# Patient Record
Sex: Male | Born: 1950 | Race: White | Hispanic: No | Marital: Married | Smoking: Never smoker
Health system: Southern US, Community
[De-identification: ages and names within clinical notes are randomized; demographics above are authoritative.]

## PROBLEM LIST (undated history)

## (undated) DIAGNOSIS — K219 Gastro-esophageal reflux disease without esophagitis: Secondary | ICD-10-CM

## (undated) DIAGNOSIS — M1990 Unspecified osteoarthritis, unspecified site: Secondary | ICD-10-CM

## (undated) DIAGNOSIS — T7840XA Allergy, unspecified, initial encounter: Secondary | ICD-10-CM

## (undated) DIAGNOSIS — N200 Calculus of kidney: Secondary | ICD-10-CM

## (undated) HISTORY — PX: KNEE SURGERY: SHX244

## (undated) HISTORY — PX: KNEE ARTHROSCOPY: SHX127

## (undated) HISTORY — PX: TONSILLECTOMY: SUR1361

## (undated) HISTORY — PX: JOINT REPLACEMENT: SHX530

## (undated) HISTORY — DX: Allergy, unspecified, initial encounter: T78.40XA

---

## 1983-12-07 HISTORY — PX: VASECTOMY: SHX75

## 2010-03-16 ENCOUNTER — Ambulatory Visit (HOSPITAL_COMMUNITY): Admission: RE | Admit: 2010-03-16 | Discharge: 2010-03-16 | Payer: Self-pay | Admitting: Internal Medicine

## 2012-05-16 ENCOUNTER — Ambulatory Visit: Payer: BC Managed Care – PPO

## 2012-05-16 ENCOUNTER — Ambulatory Visit (INDEPENDENT_AMBULATORY_CARE_PROVIDER_SITE_OTHER): Payer: BC Managed Care – PPO | Admitting: Internal Medicine

## 2012-05-16 ENCOUNTER — Telehealth: Payer: Self-pay

## 2012-05-16 VITALS — BP 121/74 | HR 65 | Temp 98.2°F | Resp 16 | Ht 67.0 in | Wt 251.0 lb

## 2012-05-16 DIAGNOSIS — R269 Unspecified abnormalities of gait and mobility: Secondary | ICD-10-CM

## 2012-05-16 DIAGNOSIS — Z0000 Encounter for general adult medical examination without abnormal findings: Secondary | ICD-10-CM

## 2012-05-16 DIAGNOSIS — Z23 Encounter for immunization: Secondary | ICD-10-CM

## 2012-05-16 DIAGNOSIS — M2550 Pain in unspecified joint: Secondary | ICD-10-CM

## 2012-05-16 DIAGNOSIS — E663 Overweight: Secondary | ICD-10-CM | POA: Insufficient documentation

## 2012-05-16 DIAGNOSIS — M25552 Pain in left hip: Secondary | ICD-10-CM

## 2012-05-16 LAB — LIPID PANEL
HDL: 37 mg/dL — ABNORMAL LOW (ref >39)
LDL Cholesterol: 91 mg/dL (ref 0–99)
Triglycerides: 85 mg/dL (ref <150)
VLDL: 17 mg/dL (ref 0–40)

## 2012-05-16 LAB — POCT URINALYSIS DIPSTICK
Bilirubin, UA: NEGATIVE
Glucose, UA: NEGATIVE
Leukocytes, UA: NEGATIVE
pH, UA: 5.5

## 2012-05-16 LAB — COMPREHENSIVE METABOLIC PANEL
ALT: 20 U/L (ref 0–53)
Albumin: 4.5 g/dL (ref 3.5–5.2)
Alkaline Phosphatase: 59 U/L (ref 39–117)
BUN: 16 mg/dL (ref 6–23)
Chloride: 106 mEq/L (ref 96–112)
Potassium: 4.4 mEq/L (ref 3.5–5.3)

## 2012-05-16 LAB — POCT UA - MICROSCOPIC ONLY
Casts, Ur, LPF, POC: NEGATIVE
Epithelial cells, urine per micros: NEGATIVE
Mucus, UA: NEGATIVE
RBC, urine, microscopic: NEGATIVE
Yeast, UA: NEGATIVE

## 2012-05-16 LAB — POCT CBC
Lymph, poc: 1.5 (ref 0.6–3.4)
MCH, POC: 30.4 pg (ref 27–31.2)
MCHC: 32.9 g/dL (ref 31.8–35.4)
MPV: 9.1 fL (ref 0–99.8)
POC LYMPH PERCENT: 37.4 %L (ref 10–50)
POC MID %: 8.1 %M (ref 0–12)
Platelet Count, POC: 292 10*3/uL (ref 142–424)
RBC: 4.54 M/uL — AB (ref 4.69–6.13)
RDW, POC: 13 %
WBC: 3.9 10*3/uL — AB (ref 4.6–10.2)

## 2012-05-16 LAB — POCT SEDIMENTATION RATE: POCT SED RATE: 15 mm/hr (ref 0–22)

## 2012-05-16 LAB — PSA: PSA: 0.56 ng/mL (ref <=4.00)

## 2012-05-16 MED ORDER — TRAMADOL HCL 50 MG PO TABS: 50.0000 mg | ORAL_TABLET | Freq: Three times a day (TID) | ORAL | Status: AC | PRN

## 2012-05-16 NOTE — Patient Instructions (Signed)
Degenerative Arthritis  You have osteoarthritis. This is the wear and tear arthritis that comes with aging. It is also called degenerative arthritis. This is common in people past middle age. It is caused by stress on the joints. The large weight bearing joints of the lower extremities are most often affected. The knees, hips, back, neck, and hands can become painful, swollen, and stiff. This is the most common type of arthritis. It comes on with age, carrying too much weight, or from an injury.  Treatment includes resting the sore joint until the pain and swelling improve. Crutches or a walker may be needed for severe flares. Only take over-the-counter or prescription medicines for pain, discomfort, or fever as directed by your caregiver. Local heat therapy may improve motion. Cortisone shots into the joint are sometimes used to reduce pain and swelling during flares.  Osteoarthritis is usually not crippling and progresses slowly. There are things you can do to decrease pain:  · Avoid high impact activities.  · Exercise regularly.  · Low impact exercises such as walking, biking and swimming help to keep the muscles strong and keep normal joint function.  · Stretching helps to keep your range of motion.  · Lose weight if you are overweight. This reduces joint stress.  In severe cases when you have pain at rest or increasing disability, joint surgery may be helpful. See your caregiver for follow-up treatment as recommended.   SEEK IMMEDIATE MEDICAL CARE IF:   · You have severe joint pain.  · Marked swelling and redness in your joint develops.  · You develop a high fever.  Document Released: 11/22/2005 Document Revised: 11/11/2011 Document Reviewed: 04/24/2007  Exit[employer]® Patient Information ©2012 [employer].

## 2012-05-16 NOTE — Progress Notes (Signed)
Subjective:    Patient ID: Stanley Kelly, male    DOB: 08/12/1951, 61 y.o.   MRN: [id number]  HPI See scanned hx C/o severe left hip pain with abnormal gait.Dr. Despina Hick wanted to do total knees 5 yrs ago. He never assesed his hip pain. He has lost 15-20 lbs and feels some better. He is taking daily nsaids for years! Has been years since last cpe, never had an ekg  Review of Systems See scanned ros    Objective:   Physical Exam  Constitutional: He is oriented to person, place, and time. He appears well-developed and well-nourished. No distress.  HENT:  Right Ear: External ear normal.  Left Ear: External ear normal.  Nose: Nose normal.  Mouth/Throat: Oropharynx is clear and moist.  Eyes: EOM are normal. Pupils are equal, round, and reactive to light.  Neck: Normal range of motion. Neck supple. No thyromegaly present.  Cardiovascular: Normal rate, regular rhythm and normal heart sounds.   Pulmonary/Chest: Effort normal and breath sounds normal.  Abdominal: Soft. Bowel sounds are normal. He exhibits no mass. There is no tenderness.  Genitourinary: Rectum normal, prostate normal and penis normal.  Musculoskeletal: He exhibits tenderness.       Left hip: He exhibits decreased range of motion, decreased strength, tenderness and bony tenderness.  Lymphadenopathy:    He has no cervical adenopathy.  Neurological: He is alert and oriented to person, place, and time. He has normal reflexes. He displays normal reflexes. No cranial nerve deficit. He exhibits normal muscle tone. Coordination abnormal.  Skin: Skin is warm and dry.  Psychiatric: He has a normal mood and affect. His behavior is normal.   Results for orders placed in visit on 05/16/12  POCT CBC      Component Value Range   WBC 3.9 (*) 4.6 - 10.2 (K/uL)   Lymph, poc 1.5  0.6 - 3.4    POC LYMPH PERCENT 37.4  10 - 50 (%L)   MID (cbc) 0.3  0 - 0.9    POC MID % 8.1  0 - 12 (%M)   POC Granulocyte 2.1  2 - 6.9    Granulocyte percent  54.5  37 - 80 (%G)   RBC 4.54 (*) 4.69 - 6.13 (M/uL)   Hemoglobin 13.8 (*) 14.1 - 18.1 (g/dL)   HCT, POC 40.9 (*) 81.1 - 53.7 (%)   MCV 92.5  80 - 97 (fL)   MCH, POC 30.4  27 - 31.2 (pg)   MCHC 32.9  31.8 - 35.4 (g/dL)   RDW, POC 91.4     Platelet Count, POC 292  142 - 424 (K/uL)   MPV 9.1  0 - 99.8 (fL)  POCT UA - MICROSCOPIC ONLY      Component Value Range   WBC, Ur, HPF, POC 0-1     RBC, urine, microscopic neg     Bacteria, U Microscopic neg     Mucus, UA neg     Epithelial cells, urine per micros neg     Crystals, Ur, HPF, POC neg     Casts, Ur, LPF, POC neg     Yeast, UA neg    POCT URINALYSIS DIPSTICK      Component Value Range   Color, UA yellow     Clarity, UA clear     Glucose, UA neg     Bilirubin, UA neg     Ketones, UA neg     Spec Grav, UA 1.020     Blood,  UA neg     pH, UA 5.5     Protein, UA neg     Urobilinogen, UA 0.2     Nitrite, UA neg     Leukocytes, UA Negative    POCT SEDIMENTATION RATE      Component Value Range   POCT SED RATE 15  0 - 22 (mm/hr)  IFOBT (OCCULT BLOOD)      Component Value Range   IFOBT Negative        [facility] reading (PRIMARY) by  Dr.Tamla Winkels cxr nad.hip severe djd      Assessment & Plan:  Severe djd left hip must see Dr. Despina Hick soon Continue to lose wt, do exercise bike

## 2012-05-16 NOTE — Telephone Encounter (Signed)
FYI: Dr. Perrin Maltese, Wasn't sure if there was anything to do with this message

## 2012-05-16 NOTE — Telephone Encounter (Signed)
PT STATES HE HAD JUST SEEN DR GUEST AND WAS GIVEN SOME MEDICINE. WAS TO COME BACK IN 3 MONTHS TO LET HIM KNOW HOW THE MEDS WERE DOING HOWEVER HIS COMPANY STATES IF HE WAS TO TAKE THE MEDICINE, HE COULDN'T WORK FOR THEM WHILE ON IT. WANTED TO THANK DR GUEST AND SAID HE WAS GOING TO SEE HIS PCP ABOUT HIS HIP. YOU MAY REACH PT AT [phone] IF NEEDED

## 2012-05-25 ENCOUNTER — Telehealth: Payer: Self-pay

## 2012-05-25 NOTE — Telephone Encounter (Signed)
PATIENT SAW DR. 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  AFTER SPEAKING WITH THE COMPANY ABOUT THIS, THEY ARE ASKING FOR HIM TO GET A LETTER FROM DR. GUEST STATING HIS DIAGNOSIS AND WHY THIS MEDICINE IS NECESSARY.  PLEASE CALL PATIENT WHEN READY FOR PICK UP

## 2012-06-06 NOTE — Telephone Encounter (Signed)
Spoke w/pt who clarified the med he needs the letter for is the tramadol that Dr Perrin Maltese Rxd for him at the last OV. Can we write the letter for him since Dr Perrin Maltese will not return for several days?

## 2012-06-07 NOTE — Telephone Encounter (Signed)
Yes, please draft the letter stating the diagnosis and the prescription for tramadol.  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  That way it will not interfere with his ability to drive safely.

## 2012-06-07 NOTE — Telephone Encounter (Signed)
Letter written, printed and signed by Chelle. Notified pt that letter is ready for p/up in drawer.

## 2012-10-07 ENCOUNTER — Telehealth: Payer: Self-pay

## 2012-10-07 NOTE — Telephone Encounter (Signed)
PT CAME IN OFFICE ABOUT 17 DAYS AGO AND DROPPED OF A SCRIPT AUTH FROM HIS INSURANCE COMPANY AND IS ALSO DROPPING OF COPY OF RX'S THAT WAS GIVEN TO HIM BY AN DR IN [place], MD HE WAS TOLD BY DR TO DROP THEM OFF TO [place] TO KEEP FOR OUR RECORDS. PLEASE CONTACT PT TO ADVISE IF PPW WORK WAS FAXED OR MAILED TO INSURANCE COMPANY.

## 2012-10-09 MED ORDER — MELOXICAM 7.5 MG PO TABS: 7.5000 mg | ORAL_TABLET | Freq: Every day | ORAL | Status: DC

## 2012-10-09 NOTE — Telephone Encounter (Signed)
Patient advised Rx sent in for him.

## 2012-10-09 NOTE — Telephone Encounter (Signed)
Patient requests renewal on Meloxicam, sent to [facility], R

## 2012-10-09 NOTE — Telephone Encounter (Signed)
Mobic sent to [facility]

## 2012-10-18 ENCOUNTER — Encounter: Payer: Self-pay | Admitting: Physician Assistant

## 2013-04-01 ENCOUNTER — Other Ambulatory Visit: Payer: Self-pay | Admitting: Physician Assistant

## 2013-06-05 ENCOUNTER — Other Ambulatory Visit: Payer: Self-pay | Admitting: Physician Assistant

## 2013-07-30 ENCOUNTER — Ambulatory Visit: Payer: Self-pay | Admitting: Emergency Medicine

## 2013-07-30 VITALS — BP 130/80 | HR 63 | Temp 98.0°F | Resp 16 | Ht 67.25 in | Wt 210.6 lb

## 2013-07-30 DIAGNOSIS — M2550 Pain in unspecified joint: Secondary | ICD-10-CM

## 2013-07-30 DIAGNOSIS — R269 Unspecified abnormalities of gait and mobility: Secondary | ICD-10-CM

## 2013-07-30 DIAGNOSIS — M25552 Pain in left hip: Secondary | ICD-10-CM

## 2013-07-30 DIAGNOSIS — M25559 Pain in unspecified hip: Secondary | ICD-10-CM

## 2013-07-30 MED ORDER — MELOXICAM 15 MG PO TABS: 15.0000 mg | ORAL_TABLET | Freq: Every day | ORAL | Status: DC

## 2013-07-30 NOTE — Progress Notes (Signed)
[facility] [address] [place] [address] [phone]  Date:  07/30/2013   Name:  Stanley Kelly   DOB:  1951-03-24   MRN:  [id number]  PCP:  No PCP Per Patient    Chief Complaint: Medication Refill   History of Present Illness:  Stanley Kelly is a 62 y.o. very pleasant male patient who presents with the following:  Has pain in left hip and is not able to take tramadol for pain due to his driving.  Has run out of mobic.  Is convinced that he needs surgery on his hip and will not consider surgical opinion regarding options for care.  No improvement with over the counter medications or other home remedies. Denies other complaint or health concern today.   Patient Active Problem List   Diagnosis Date Noted  . Overweight 05/16/2012    Past Medical History  Diagnosis Date  . Hx of knee surgery     No past surgical history on file.  History  Substance Use Topics  . Smoking status: Never Smoker   . Smokeless tobacco: Not on file  . Alcohol Use: No    No family history on file.  No Known Allergies  Medication list has been reviewed and updated.  Current Outpatient Prescriptions on File Prior to Visit  Medication Sig Dispense Refill  . acetaminophen (TYLENOL) 325 MG tablet Take 650 mg by mouth every 6 (six) hours as needed.      Marland Kitchen aspirin 81 MG tablet Take 81 mg by mouth daily.      Jennette Banker Sodium 30-100 MG CAPS Take by mouth.      Marland Kitchen ibuprofen (ADVIL,MOTRIN) 200 MG tablet Take 200 mg by mouth every 6 (six) hours as needed.      . meloxicam (MOBIC) 7.5 MG tablet TAKE 1 TABLET DAILY  90 tablet  0  . Misc Natural Products (OSTEO BI-FLEX ADV DOUBLE ST PO) Take by mouth.      . Multiple Vitamin (MULTIVITAMIN WITH MINERALS) TABS Take 1 tablet by mouth daily.      . niacin 50 MG tablet Take 50 mg by mouth daily with breakfast.      . Omega 3-6-9 Fatty Acids (OMEGA 3-6-9 COMPLEX PO) Take by mouth.      . psyllium (REGULOID) 0.52 G capsule  Take 0.52 g by mouth daily.      . Red Yeast Rice 600 MG CAPS Take 600 mg by mouth.      . vitamin C (ASCORBIC ACID) 500 MG tablet Take 1,000 mg by mouth daily.      . vitamin E 100 UNIT capsule Take 200 Units by mouth daily.       No current facility-administered medications on file prior to visit.    Review of Systems:  As per HPI, otherwise negative.    Physical Examination: Filed Vitals:   07/30/13 0842  BP: 130/80  Pulse: 63  Temp: 98 F (36.7 C)  Resp: 16   Filed Vitals:   07/30/13 0842  Height: 5' 7.25" (1.708 m)  Weight: 210 lb 9.6 oz (95.528 kg)   Body mass index is 32.75 kg/(m^2). Ideal Body Weight: Weight in (lb) to have BMI = 25: 160.5   GEN: WDWN, NAD, Non-toxic, Alert & Oriented x 3 HEENT: Atraumatic, Normocephalic.  Ears and Nose: No external deformity. EXTR: No clubbing/cyanosis/edema NEURO: Normal gait.  PSYCH: Normally interactive. Conversant. Not depressed or anxious appearing.  Calm demeanor.  HIP:  Left hep tender with full ROM  Assessment and Plan: Left hip pain Refill mobic  Signed,  Phillips Odor, MD

## 2013-08-01 ENCOUNTER — Telehealth: Payer: Self-pay

## 2013-08-01 MED ORDER — MELOXICAM 15 MG PO TABS: 15.0000 mg | ORAL_TABLET | Freq: Every day | ORAL | Status: DC

## 2013-08-01 NOTE — Telephone Encounter (Signed)
What medication? Resent the [facility] to [facility], it was previously sent to [facility]. He is asking for Tramadol as well. Please advise on the Tramadol. The [facility] was [facility] to [facility]. If you approve the Tramadol, you will need to sign and let me fax, since it is now controlled substance.

## 2013-08-01 NOTE — Telephone Encounter (Signed)
He told me he has tramadol that he says he cannot take due to driving.  I offered him a referral to surgery which he refused.  I will refer to surgery if he is willing.  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

## 2013-08-01 NOTE — Telephone Encounter (Signed)
Please get more information.  What medication?  Pharmacy needs to send electronic requests.

## 2013-08-01 NOTE — Telephone Encounter (Signed)
[facility] [place], he can no longer use [facility].  Has been without his medication for four days now. Please call at [phone].

## 2013-08-02 NOTE — Telephone Encounter (Signed)
Thanks. Left message for him to call me back.

## 2013-08-07 NOTE — Telephone Encounter (Signed)
Notified that meloxicam was sent in and advised that tramadol is now a control and he states that he will not need that refilled. He also will still refuse the surgeon.

## 2014-01-29 ENCOUNTER — Other Ambulatory Visit: Payer: Self-pay | Admitting: Physician Assistant

## 2014-02-19 ENCOUNTER — Other Ambulatory Visit: Payer: Self-pay | Admitting: Physician Assistant

## 2014-05-25 ENCOUNTER — Ambulatory Visit (INDEPENDENT_AMBULATORY_CARE_PROVIDER_SITE_OTHER): Payer: Managed Care, Other (non HMO) | Admitting: Family Medicine

## 2014-05-25 VITALS — BP 130/84 | HR 72 | Temp 98.0°F | Resp 18 | Ht 68.0 in | Wt 219.0 lb

## 2014-05-25 DIAGNOSIS — L0100 Impetigo, unspecified: Secondary | ICD-10-CM

## 2014-05-25 DIAGNOSIS — L02419 Cutaneous abscess of limb, unspecified: Secondary | ICD-10-CM

## 2014-05-25 DIAGNOSIS — L03115 Cellulitis of right lower limb: Secondary | ICD-10-CM

## 2014-05-25 DIAGNOSIS — L03119 Cellulitis of unspecified part of limb: Secondary | ICD-10-CM

## 2014-05-25 MED ORDER — DOXYCYCLINE HYCLATE 100 MG PO CAPS: 100.0000 mg | ORAL_CAPSULE | Freq: Two times a day (BID) | ORAL | Status: DC

## 2014-05-25 MED ORDER — MUPIROCIN 2 % EX OINT: 1.0000 "application " | TOPICAL_OINTMENT | Freq: Two times a day (BID) | CUTANEOUS | Status: DC

## 2014-05-25 MED ORDER — LIDOCAINE HCL 2 % EX GEL: 1.0000 "application " | CUTANEOUS | Status: DC | PRN

## 2014-05-25 NOTE — Patient Instructions (Signed)
Cellulitis Cellulitis is an infection of the skin and the tissue beneath it. The infected area is usually red and tender. Cellulitis occurs most often in the arms and lower legs.  CAUSES  Cellulitis is caused by bacteria that enter the skin through cracks or cuts in the skin. The most common types of bacteria that cause cellulitis are Staphylococcus and Streptococcus. SYMPTOMS   Redness and warmth.  Swelling.  Tenderness or pain.  Fever. DIAGNOSIS  Your caregiver can usually determine what is wrong based on a physical exam. Blood tests may also be done. TREATMENT  Treatment usually involves taking an antibiotic medicine. HOME CARE INSTRUCTIONS   Take your antibiotics as directed. Finish them even if you start to feel better.  Keep the infected arm or leg elevated to reduce swelling.  Apply a warm cloth to the affected area up to 4 times per day to relieve pain.  Only take over-the-counter or prescription medicines for pain, discomfort, or fever as directed by your caregiver.  Keep all follow-up appointments as directed by your caregiver. SEEK MEDICAL CARE IF:   You notice red streaks coming from the infected area.  Your red area gets larger or turns dark in color.  Your bone or joint underneath the infected area becomes painful after the skin has healed.  Your infection returns in the same area or another area.  You notice a swollen bump in the infected area.  You develop new symptoms. SEEK IMMEDIATE MEDICAL CARE IF:   You have a fever.  You feel very sleepy.  You develop vomiting or diarrhea.  You have a general ill feeling (malaise) with muscle aches and pains. MAKE SURE YOU:   Understand these instructions.  Will watch your condition.  Will get help right away if you are not doing well or get worse. Document Released: 09/01/2005 Document Revised: 05/23/2012 Document Reviewed: 02/07/2012 [facility] Patient Information 2015 [place], [place]. This information is  not intended to replace advice given to you by your health care provider. Make sure you discuss any questions you have with your health care provider. Spider Bite Spider bites are not common. Most spider bites do not cause serious problems. The elderly, very young children, and people with certain existing medical conditions are more likely to experience significant symptoms. SYMPTOMS  Spider bites may not cause any pain at first. Within 1 or 2 days of the bite, there may be swelling, redness, and pain in the bite area. However, some spider bites can cause pain within the first hour. TREATMENT  Your caregiver may prescribe antibiotic medicine if a bacterial infection develops in the bite. However, not all spider bites require antibiotics or prescription medicines.  HOME CARE INSTRUCTIONS  Do not scratch the bite area.  Keep the bite area clean and dry. Wash the area with soap and water as directed.  Put ice or cool compresses on the bite area.  Put ice in a plastic bag.  Place a towel between your skin and the bag.  Leave the ice on for 20 minutes, 4 times a day for the first 2 to 3 days, or as directed.  Keep the bite area elevated above the level of your heart. This helps reduce redness and swelling.  Only take over-the-counter or prescription medicines as directed by your caregiver.  If you are given antibiotics, take them as directed. Finish them even if you start to feel better. You may need a tetanus shot if:  You cannot remember when you had your  last tetanus shot.  You have never had a tetanus shot.  The injury broke your skin. If you get a tetanus shot, your arm may swell, get red, and feel warm to the touch. This is common and not a problem. If you need a tetanus shot and you choose not to have one, there is a rare chance of getting tetanus. Sickness from tetanus can be serious. SEEK MEDICAL CARE IF: Your bite is not better after 3 days of treatment. SEEK IMMEDIATE  MEDICAL CARE IF:  Your bite turns purple or develops increased swelling, pain, or redness.  You develop shortness of breath or chest pain.  You have muscle cramps or painful muscle spasms.  You develop abdominal pain, nausea, or vomiting.  You feel unusually tired or sleepy. MAKE SURE YOU:  Understand these instructions.  Will watch your condition.  Will get help right away if you are not doing well or get worse. Document Released: 12/30/2004 Document Revised: 02/14/2012 Document Reviewed: 06/23/2011 [facility] Patient Information 2015 [place], [place]. This information is not intended to replace advice given to you by your health care provider. Make sure you discuss any questions you have with your health care provider.

## 2014-05-25 NOTE — Progress Notes (Signed)
Subjective:  This chart was scribed for Delman Cheadle, MD, by Neta Ehlers, ED Scribe. This patient's care was started 12:54 PM.   Patient ID: Stanley Kelly, male    DOB: February 25, 1951, 63 y.o.   MRN: [id number]   Chief Complaint  Patient presents with  . Insect Bite    right lower leg   . Leg Swelling    HPI  HPI Comments: Stanley Kelly is a 63 y.o. male who presents to [facility] complaining of an insect bite to his right lower leg which occurred four days ago on Tuesday while the pt was at work. Stanley Kelly did not see what bit him, and he is unsure of the exact site of the bite. He reports increasing redness, itching, and swelling to the site.  He reports he wore boots as well as tennis shoes with socks which may have aggravated the site. He has used topical Lidocaine to the site with relief. Stanley Kelly denies pain.   Past Medical History  Diagnosis Date  . Hx of knee surgery    Current Outpatient Prescriptions on File Prior to Visit  Medication Sig Dispense Refill  . acetaminophen (TYLENOL) 325 MG tablet Take 650 mg by mouth every 6 (six) hours as needed.      Marland Kitchen aspirin 81 MG tablet Take 81 mg by mouth daily.      Sarajane Marek Sodium 30-100 MG CAPS Take by mouth.      Marland Kitchen ibuprofen (ADVIL,MOTRIN) 200 MG tablet Take 200 mg by mouth every 6 (six) hours as needed.      . meloxicam (MOBIC) 15 MG tablet TAKE 1 TABLET (15 MG TOTAL) BY MOUTH DAILY.  90 tablet  1  . Misc Natural Products (OSTEO BI-FLEX ADV DOUBLE ST PO) Take by mouth.      . Multiple Vitamin (MULTIVITAMIN WITH MINERALS) TABS Take 1 tablet by mouth daily.      . niacin 50 MG tablet Take 50 mg by mouth daily with breakfast.      . Omega 3-6-9 Fatty Acids (OMEGA 3-6-9 COMPLEX PO) Take by mouth.      . psyllium (REGULOID) 0.52 G capsule Take 0.52 g by mouth daily.      . Red Yeast Rice 600 MG CAPS Take 600 mg by mouth.      . traMADol (ULTRAM) 50 MG tablet Take 50 mg by mouth every 8 (eight) hours as needed for pain.      .  vitamin C (ASCORBIC ACID) 500 MG tablet Take 1,000 mg by mouth daily.      . vitamin E 100 UNIT capsule Take 200 Units by mouth daily.       No current facility-administered medications on file prior to visit.   No Known Allergies  Review of Systems  Constitutional: Negative for fever and chills.  Skin: Positive for color change and rash.   Triage Vitals: BP 130/84  Pulse 72  Temp(Src) 98 F (36.7 C) (Oral)  Resp 18  Ht 5\' 8"  (1.727 m)  Wt 219 lb (99.338 kg)  BMI 33.31 kg/m2  SpO2 97%     Objective:   Physical Exam  Nursing note and vitals reviewed. Constitutional: He is oriented to person, place, and time. He appears well-developed and well-nourished. No distress.  HENT:  Head: Normocephalic and atraumatic.  Eyes: Conjunctivae and EOM are normal.  Neck: Neck supple. No tracheal deviation present.  Cardiovascular: Normal rate.   Pulmonary/Chest: Effort normal. No respiratory distress.  Musculoskeletal:  Normal range of motion.  Neurological: He is alert and oriented to person, place, and time.  Skin: Skin is warm and dry. Rash noted. There is erythema.  Macular erythema over anterior and lateral ankles spreading up to first 1/3 of leg.  Blanching, tight edema, warmth, tenderness with several areas of superficial ulceration draining serous fluid and surrounding granulation tissue.  Mult vesicles of clear fluids. Senior ulcers.  Pinpoint erythematous papules seen around edge of rash.   Psychiatric: He has a normal mood and affect. His behavior is normal.      Assessment & Plan:   Cellulitis of right lower extremity  Impetigo  Meds ordered this encounter  Medications  . doxycycline (VIBRAMYCIN) 100 MG capsule    Sig: Take 1 capsule (100 mg total) by mouth 2 (two) times daily.    Dispense:  20 capsule    Refill:  0  . mupirocin ointment (BACTROBAN) 2 %    Sig: Place 1 application into the nose 2 (two) times daily.    Dispense:  22 g    Refill:  0  . lidocaine  (XYLOCAINE JELLY) 2 % jelly    Sig: Apply 1 application topically as needed.    Dispense:  30 mL    Refill:  1    I personally performed the services described in this documentation, which was scribed in my presence. The recorded information has been reviewed and considered, and addended by me as needed.  Delman Cheadle, MD MPH

## 2014-08-28 ENCOUNTER — Other Ambulatory Visit: Payer: Self-pay | Admitting: Physician Assistant

## 2014-09-08 ENCOUNTER — Other Ambulatory Visit: Payer: Self-pay | Admitting: Physician Assistant

## 2014-10-07 ENCOUNTER — Ambulatory Visit (INDEPENDENT_AMBULATORY_CARE_PROVIDER_SITE_OTHER): Payer: Managed Care, Other (non HMO) | Admitting: Physician Assistant

## 2014-10-07 ENCOUNTER — Telehealth: Payer: Self-pay

## 2014-10-07 ENCOUNTER — Encounter: Payer: Self-pay | Admitting: Physician Assistant

## 2014-10-07 VITALS — BP 124/82 | HR 66 | Temp 98.5°F | Resp 16 | Ht 70.0 in | Wt 205.6 lb

## 2014-10-07 DIAGNOSIS — Z114 Encounter for screening for human immunodeficiency virus [HIV]: Secondary | ICD-10-CM

## 2014-10-07 DIAGNOSIS — Z0000 Encounter for general adult medical examination without abnormal findings: Secondary | ICD-10-CM

## 2014-10-07 DIAGNOSIS — Z1329 Encounter for screening for other suspected endocrine disorder: Secondary | ICD-10-CM

## 2014-10-07 DIAGNOSIS — Z8739 Personal history of other diseases of the musculoskeletal system and connective tissue: Secondary | ICD-10-CM | POA: Insufficient documentation

## 2014-10-07 DIAGNOSIS — Z23 Encounter for immunization: Secondary | ICD-10-CM

## 2014-10-07 DIAGNOSIS — Z113 Encounter for screening for infections with a predominantly sexual mode of transmission: Secondary | ICD-10-CM

## 2014-10-07 DIAGNOSIS — Z1159 Encounter for screening for other viral diseases: Secondary | ICD-10-CM

## 2014-10-07 DIAGNOSIS — Z13220 Encounter for screening for lipoid disorders: Secondary | ICD-10-CM

## 2014-10-07 LAB — POCT URINALYSIS DIPSTICK
BILIRUBIN UA: NEGATIVE
Blood, UA: NEGATIVE
GLUCOSE UA: NEGATIVE
Ketones, UA: NEGATIVE
Leukocytes, UA: NEGATIVE
NITRITE UA: NEGATIVE
Protein, UA: NEGATIVE
Spec Grav, UA: 1.025
Urobilinogen, UA: 0.2
pH, UA: 6

## 2014-10-07 LAB — LIPID PANEL
CHOLESTEROL: 168 mg/dL (ref 0–200)
HDL: 49 mg/dL (ref >39)
LDL Cholesterol: 101 mg/dL — ABNORMAL HIGH (ref 0–99)
Total CHOL/HDL Ratio: 3.4 Ratio
Triglycerides: 89 mg/dL (ref <150)
VLDL: 18 mg/dL (ref 0–40)

## 2014-10-07 LAB — COMPLETE METABOLIC PANEL WITH GFR
ALT: 23 U/L (ref 0–53)
AST: 18 U/L (ref 0–37)
Albumin: 4.2 g/dL (ref 3.5–5.2)
Alkaline Phosphatase: 60 U/L (ref 39–117)
BILIRUBIN TOTAL: 0.4 mg/dL (ref 0.2–1.2)
BUN: 20 mg/dL (ref 6–23)
CALCIUM: 9.7 mg/dL (ref 8.4–10.5)
CO2: 26 meq/L (ref 19–32)
CREATININE: 0.94 mg/dL (ref 0.50–1.35)
Chloride: 104 mEq/L (ref 96–112)
GFR, Est Non African American: 87 mL/min
GLUCOSE: 98 mg/dL (ref 70–99)
POTASSIUM: 4.4 meq/L (ref 3.5–5.3)
SODIUM: 140 meq/L (ref 135–145)
Total Protein: 6.9 g/dL (ref 6.0–8.3)

## 2014-10-07 LAB — HEPATITIS B SURFACE ANTIBODY, QUANTITATIVE: HEPATITIS B-POST: 0.2 m[IU]/mL

## 2014-10-07 LAB — POCT CBC
Granulocyte percent: 51.2 %G (ref 37–80)
HEMATOCRIT: 46.5 % (ref 43.5–53.7)
HEMOGLOBIN: 15.4 g/dL (ref 14.1–18.1)
Lymph, poc: 1.3 (ref 0.6–3.4)
MCH, POC: 31.1 pg (ref 27–31.2)
MCHC: 33 g/dL (ref 31.8–35.4)
MCV: 94 fL (ref 80–97)
MID (cbc): 0.2 (ref 0–0.9)
MPV: 7.4 fL (ref 0–99.8)
PLATELET COUNT, POC: 280 10*3/uL (ref 142–424)
POC Granulocyte: 1.5 — AB (ref 2–6.9)
POC LYMPH %: 42.3 % (ref 10–50)
POC MID %: 6.5 % (ref 0–12)
RBC: 4.95 M/uL (ref 4.69–6.13)
RDW, POC: 13 %
WBC: 3 10*3/uL — AB (ref 4.6–10.2)

## 2014-10-07 LAB — RPR

## 2014-10-07 LAB — POCT UA - MICROSCOPIC ONLY
BACTERIA, U MICROSCOPIC: NEGATIVE
CASTS, UR, LPF, POC: NEGATIVE
CRYSTALS, UR, HPF, POC: NEGATIVE
MUCUS UA: NEGATIVE
RBC, URINE, MICROSCOPIC: NEGATIVE
WBC, Ur, HPF, POC: NEGATIVE
YEAST UA: NEGATIVE

## 2014-10-07 LAB — TSH: TSH: 1.063 u[IU]/mL (ref 0.350–4.500)

## 2014-10-07 LAB — HIV ANTIBODY (ROUTINE TESTING W REFLEX): HIV: NONREACTIVE

## 2014-10-07 LAB — HEPATITIS C ANTIBODY: HCV Ab: NEGATIVE

## 2014-10-07 LAB — HEPATITIS B SURFACE ANTIGEN: HEP B S AG: NEGATIVE

## 2014-10-07 MED ORDER — ZOSTER VACCINE LIVE 19400 UNT/0.65ML ~~LOC~~ SOLR: 0.6500 mL | Freq: Once | SUBCUTANEOUS | Status: DC

## 2014-10-07 NOTE — Progress Notes (Signed)
IDENTIFYING INFORMATION  EAMON TANTILLO / DOB: June 10, 1951 / MRN: [id number]  The patient has Overweight and History of osteoarthritis on his problem list.  SUBJECTIVE  Chief Complaint: Annual Exam   History of present illness: Mr. Coate is a 63 y.o. year old male who presents for an annual exam. He has no complaints today.     He reports his last colonoscopy was 4-5 years ago in [place]. He reports that there were "no problems" and he can not recall when his next follow up is.  Chart dissection reveals no documentation. He denies blood in the stool and dark tarry stools at this time.  He denies abdomina pain.    He receives dental care twice yearly at [facility] via Dr. Luan Pulling.  He receives yearly eye care at [facility] in [place].  He is a never smoker and a non drinker.  He has intentionally lost 40 lbs over nine months at the advice of his orthopedic doctor.  He did this by cutting out soda and junk food, and portion control.  He would like to get to under two hundred pounds.    He is adopted.  He does not complain of urinary hesitancy or post void dribbling and declines the PSA test today.    He received a flu shot last week.  He has not received his shingles vaccine and would like a prescription for that today. He does not exercise on a regular basis.         He  has a past medical history of knee surgery.  The patient has a current medication list which includes the following prescription(s): aspirin, casanthranol-docusate sodium, meloxicam, misc natural products, multivitamin with minerals, niacin, omega 3-6-9 fatty acids, psyllium, red yeast rice, vitamin c, vitamin e, and tramadol.  Mr. Rockers has No Known Allergies. He  reports that he has never smoked. He does not have any smokeless tobacco history on file. He reports that he does not drink alcohol or use illicit drugs. He  reports that he currently engages in sexual activity with his wife. The patient is adopted.   The patient   has past surgical history that includes Knee surgery.   Review of Systems  Constitutional: Negative.   HENT: Negative.   Eyes: Negative.   Respiratory: Negative.   Cardiovascular: Negative.   Gastrointestinal: Negative.   Genitourinary: Negative.   Musculoskeletal: Positive for joint pain. Negative for myalgias, back pain, falls and neck pain.  Skin: Negative.   Neurological: Negative.   Endo/Heme/Allergies: Positive for environmental allergies. Negative for polydipsia. Bruises/bleeds easily (takes daily ASA).  Psychiatric/Behavioral: Negative.     OBJECTIVE  Blood pressure 124/82, pulse 66, temperature 98.5 F (36.9 C), temperature source Oral, resp. rate 16, height 5\' 10"  (1.778 m), weight 205 lb 9.6 oz (93.26 kg), SpO2 98 %. The patient's body mass index is 29.5 kg/(m^2).  Physical Exam  Results for orders placed or performed in visit on 10/07/14 (from the past 24 hour(s))  COMPLETE METABOLIC PANEL WITH GFR     Status: None (Preliminary result)   Collection Time: 10/07/14 10:26 AM  Result Value Ref Range   Sodium  135 - 145 mEq/L   Potassium  3.5 - 5.3 mEq/L   Chloride  96 - 112 mEq/L   CO2  19 - 32 mEq/L   Glucose, Bld  70 - 99 mg/dL   BUN  6 - 23 mg/dL   Creat  0.50 - 1.35 mg/dL   Total  Bilirubin  0.2 - 1.2 mg/dL   Alkaline Phosphatase  39 - 117 U/L   AST  0 - 37 U/L   ALT  0 - 53 U/L   Total Protein  6.0 - 8.3 g/dL   Albumin  3.5 - 5.2 g/dL   Calcium  8.4 - 10.5 mg/dL   GFR, Est African American  mL/min   GFR, Est Non African American  mL/min   Narrative   Performed at:  [address], [address]                [place], [place] [address]  TSH     Status: None   Collection Time: 10/07/14 10:26 AM  Result Value Ref Range   TSH 1.063 0.350 - 4.500 uIU/mL   Narrative   Performed at:  [address], [address]                [place], [place] [address]  Hepatitis C antibody     Status: None  (Preliminary result)   Collection Time: 10/07/14 10:26 AM  Result Value Ref Range   HCV Ab  NEGATIVE   Narrative   Performed at:  [address], [address]                [place], [place] [address]  Hepatitis B surface antibody     Status: None (Preliminary result)   Collection Time: 10/07/14 10:26 AM  Result Value Ref Range   Hepatitis B-Post  mIU/mL   Narrative   Performed at:  [address], [address]                [place], [place] [address]  Hepatitis B surface antigen     Status: None (Preliminary result)   Collection Time: 10/07/14 10:26 AM  Result Value Ref Range   Hepatitis B Surface Ag  NEGATIVE   Narrative   Performed at:  [address], [address]                [place], [place] [address]  RPR     Status: None (Preliminary result)   Collection Time: 10/07/14 10:26 AM  Result Value Ref Range   RPR  NON REAC   Narrative   Performed at:  [address], [address]                , [place] [address]  HIV antibody     Status: None (Preliminary result)   Collection Time: 10/07/14 10:26 AM  Result Value Ref Range   HIV 1&2 Ab, 4th Generation  NONREACTIVE   Narrative   Performed at:  [address], [address]                [place],  [address]  Lipid panel     Status: None (Preliminary result)   Collection Time: 10/07/14 10:26 AM  Result Value Ref Range   Cholesterol  0 - 200 mg/dL   Triglycerides  <150 mg/dL   HDL  >39 mg/dL   Total CHOL/HDL Ratio  Ratio   VLDL  0 - 40 mg/dL   LDL Cholesterol  0 - 99 mg/dL   Narrative   Performed at:  [address]                [place], [place] [address]  POCT CBC     Status: Abnormal   Collection Time: 10/07/14 10:55 AM  Result Value Ref Range   WBC 3.0 (A) 4.6 - 10.2 K/uL   Lymph, poc 1.3 0.6  - 3.4   POC LYMPH PERCENT 42.3 10 - 50 %L   MID (cbc) 0.2 0 - 0.9   POC MID % 6.5 0 - 12 %M   POC Granulocyte 1.5 (A) 2 - 6.9   Granulocyte percent 51.2 37 - 80 %G   RBC 4.95 4.69 - 6.13 M/uL   Hemoglobin 15.4 14.1 - 18.1 g/dL   HCT, POC 46.5 43.5 - 53.7 %   MCV 94.0 80 - 97 fL   MCH, POC 31.1 27 - 31.2 pg   MCHC 33.0 31.8 - 35.4 g/dL   RDW, POC 13.0 %   Platelet Count, POC 280 142 - 424 K/uL   MPV 7.4 0 - 99.8 fL  POCT urinalysis dipstick     Status: None   Collection Time: 10/07/14 10:55 AM  Result Value Ref Range   Color, UA yellow    Clarity, UA clear    Glucose, UA neg    Bilirubin, UA neg    Ketones, UA neg    Spec Grav, UA 1.025    Blood, UA neg    pH, UA 6.0    Protein, UA neg    Urobilinogen, UA 0.2    Nitrite, UA neg    Leukocytes, UA Negative   POCT UA - Microscopic Only     Status: None   Collection Time: 10/07/14 10:55 AM  Result Value Ref Range   WBC, Ur, HPF, POC neg    RBC, urine, microscopic neg    Bacteria, U Microscopic neg    Mucus, UA neg    Epithelial cells, urine per micros 0-1    Crystals, Ur, HPF, POC neg    Casts, Ur, LPF, POC neg    Yeast, UA neg     ASSESSMENT & PLAN  Cayton was seen today for annual exam.  Diagnoses and associated orders for this visit:  History of osteoarthritis  Annual physical exam - POCT CBC - POCT urinalysis dipstick - POCT UA - Microscopic Only - COMPLETE METABOLIC PANEL WITH GFR  Screening for HIV (human immunodeficiency virus) - HIV antibody  Thyroid disorder screen - TSH  Lipid screening - Lipid panel  Need for hepatitis B screening test - Hepatitis B surface antibody - Hepatitis B surface antigen  Need for hepatitis C screening test - Hepatitis C antibody  Routine screening for STI (sexually transmitted infection) - RPR  Need for varicella vaccine - Varicella-zoster vaccine subcutaneous     The patient was instructed to to call or comeback to clinic as needed, or should symptoms  warrant. Follow up in one year.  Philis Fendt, MHS, PA-C [facility] 10/07/2014 2:12 PM

## 2014-10-07 NOTE — Patient Instructions (Signed)
Exercise to Stay Healthy Exercise helps you become and stay healthy. EXERCISE IDEAS AND TIPS Choose exercises that:  You enjoy.  Fit into your day. You do not need to exercise really hard to be healthy. You can do exercises at a slow or medium level and stay healthy. You can:  Stretch before and after working out.  Try yoga, Pilates, or tai chi.  Lift weights.  Walk fast, swim, jog, run, climb stairs, bicycle, dance, or rollerskate.  Take aerobic classes. Exercises that burn about 150 calories:  Running 1  miles in 15 minutes.  Playing volleyball for 45 to 60 minutes.  Washing and waxing a car for 45 to 60 minutes.  Playing touch football for 45 minutes.  Walking 1  miles in 35 minutes.  Pushing a stroller 1  miles in 30 minutes.  Playing basketball for 30 minutes.  Raking leaves for 30 minutes.  Bicycling 5 miles in 30 minutes.  Walking 2 miles in 30 minutes.  Dancing for 30 minutes.  Shoveling snow for 15 minutes.  Swimming laps for 20 minutes.  Walking up stairs for 15 minutes.  Bicycling 4 miles in 15 minutes.  Gardening for 30 to 45 minutes.  Jumping rope for 15 minutes.  Washing windows or floors for 45 to 60 minutes. Document Released: 12/25/2010 Document Revised: 02/14/2012 Document Reviewed: 12/25/2010 ExitCare Patient Information 2015 [employer]. This information is not intended to replace advice given to you by your health care provider. Make sure you discuss any questions you have with your health care provider.  

## 2014-10-07 NOTE — Telephone Encounter (Signed)
Lm- shingles vaccine printed and was given to pt with his AVS

## 2014-10-07 NOTE — Telephone Encounter (Signed)
Pt saw Dr. Marin Comment on 11/2 for a shingles vaccination. He sates that the [facility] in [place] has not received [facility] yet, please advise pt

## 2014-10-08 ENCOUNTER — Telehealth: Payer: Self-pay

## 2014-10-09 NOTE — Telephone Encounter (Signed)
Error

## 2014-10-09 NOTE — Progress Notes (Signed)
I have discussed this case with Mr. Carlis Abbott, [place] and agree.

## 2014-10-14 ENCOUNTER — Telehealth: Payer: Self-pay | Admitting: *Deleted

## 2014-10-14 MED ORDER — ZOSTER VACCINE LIVE 19400 UNT/0.65ML ~~LOC~~ SOLR: 0.6500 mL | Freq: Once | SUBCUTANEOUS | Status: DC

## 2014-10-14 NOTE — Telephone Encounter (Signed)
Called pharmacy to confirm receipt. Spoke to [place]- she states they do have the prescription and will complete the fill as soon as the pt comes in.  Pt advised.

## 2015-07-07 ENCOUNTER — Other Ambulatory Visit: Payer: Self-pay | Admitting: Orthopedic Surgery

## 2015-07-17 ENCOUNTER — Encounter: Payer: Self-pay | Admitting: Family Medicine

## 2015-07-18 ENCOUNTER — Telehealth: Payer: Self-pay

## 2015-07-18 NOTE — Telephone Encounter (Signed)
Letter was written and signed this morning.

## 2015-07-18 NOTE — Telephone Encounter (Signed)
Surgical clearance form was faxed from [facility] for this pt. Dr. Brigitte Pulse, I placed in your box.

## 2015-07-18 NOTE — Telephone Encounter (Signed)
He needs an office visit I believe. Let me know I can call pt.

## 2015-07-18 NOTE — Telephone Encounter (Signed)
You are correct, I faxed to [facility] ortho. Left message letting pt know.

## 2015-08-06 ENCOUNTER — Encounter (HOSPITAL_COMMUNITY): Payer: Self-pay

## 2015-08-06 ENCOUNTER — Encounter (HOSPITAL_COMMUNITY)
Admission: RE | Admit: 2015-08-06 | Discharge: 2015-08-06 | Disposition: A | Discharge status: Home / Self Care | Payer: Managed Care, Other (non HMO) | Source: Ambulatory Visit | Attending: Orthopedic Surgery | Admitting: Orthopedic Surgery

## 2015-08-06 DIAGNOSIS — Z01818 Encounter for other preprocedural examination: Secondary | ICD-10-CM

## 2015-08-06 DIAGNOSIS — Z01812 Encounter for preprocedural laboratory examination: Secondary | ICD-10-CM | POA: Insufficient documentation

## 2015-08-06 DIAGNOSIS — Z0183 Encounter for blood typing: Secondary | ICD-10-CM | POA: Insufficient documentation

## 2015-08-06 DIAGNOSIS — I4510 Unspecified right bundle-branch block: Secondary | ICD-10-CM | POA: Diagnosis not present

## 2015-08-06 DIAGNOSIS — M1612 Unilateral primary osteoarthritis, left hip: Secondary | ICD-10-CM | POA: Diagnosis not present

## 2015-08-06 HISTORY — DX: Unspecified osteoarthritis, unspecified site: M19.90

## 2015-08-06 LAB — PROTIME-INR
INR: 1.02 (ref 0.00–1.49)
PROTHROMBIN TIME: 13.6 s (ref 11.6–15.2)

## 2015-08-06 LAB — COMPREHENSIVE METABOLIC PANEL
ALK PHOS: 58 U/L (ref 38–126)
ALT: 30 U/L (ref 17–63)
AST: 27 U/L (ref 15–41)
Albumin: 4.1 g/dL (ref 3.5–5.0)
Anion gap: 7 (ref 5–15)
BILIRUBIN TOTAL: 0.8 mg/dL (ref 0.3–1.2)
BUN: 15 mg/dL (ref 6–20)
CALCIUM: 9.7 mg/dL (ref 8.9–10.3)
CO2: 25 mmol/L (ref 22–32)
CREATININE: 0.83 mg/dL (ref 0.61–1.24)
Chloride: 106 mmol/L (ref 101–111)
GFR calc Af Amer: >60 mL/min (ref >60)
Glucose, Bld: 103 mg/dL — ABNORMAL HIGH (ref 65–99)
Potassium: 4.1 mmol/L (ref 3.5–5.1)
Sodium: 138 mmol/L (ref 135–145)
TOTAL PROTEIN: 6.7 g/dL (ref 6.5–8.1)

## 2015-08-06 LAB — CBC WITH DIFFERENTIAL/PLATELET
BASOS ABS: 0 10*3/uL (ref 0.0–0.1)
Basophils Relative: 1 % (ref 0–1)
Eosinophils Absolute: 0.2 10*3/uL (ref 0.0–0.7)
Eosinophils Relative: 4 % (ref 0–5)
HEMATOCRIT: 42.6 % (ref 39.0–52.0)
Hemoglobin: 14.7 g/dL (ref 13.0–17.0)
LYMPHS ABS: 1.2 10*3/uL (ref 0.7–4.0)
LYMPHS PCT: 35 % (ref 12–46)
MCH: 31.1 pg (ref 26.0–34.0)
MCHC: 34.5 g/dL (ref 30.0–36.0)
MCV: 90.3 fL (ref 78.0–100.0)
Monocytes Absolute: 0.3 10*3/uL (ref 0.1–1.0)
Monocytes Relative: 9 % (ref 3–12)
NEUTROS ABS: 1.8 10*3/uL (ref 1.7–7.7)
Neutrophils Relative %: 51 % (ref 43–77)
Platelets: 253 10*3/uL (ref 150–400)
RBC: 4.72 MIL/uL (ref 4.22–5.81)
RDW: 12.6 % (ref 11.5–15.5)
WBC: 3.5 10*3/uL — AB (ref 4.0–10.5)

## 2015-08-06 LAB — URINALYSIS, ROUTINE W REFLEX MICROSCOPIC
Bilirubin Urine: NEGATIVE
Glucose, UA: NEGATIVE mg/dL
Hgb urine dipstick: NEGATIVE
KETONES UR: NEGATIVE mg/dL
LEUKOCYTES UA: NEGATIVE
NITRITE: NEGATIVE
PH: 7 (ref 5.0–8.0)
Protein, ur: NEGATIVE mg/dL
SPECIFIC GRAVITY, URINE: 1.017 (ref 1.005–1.030)
Urobilinogen, UA: 0.2 mg/dL (ref 0.0–1.0)

## 2015-08-06 LAB — SURGICAL PCR SCREEN
MRSA, PCR: NEGATIVE
Staphylococcus aureus: NEGATIVE

## 2015-08-06 LAB — TYPE AND SCREEN
ABO/RH(D): AB POS
Antibody Screen: NEGATIVE

## 2015-08-06 LAB — APTT: APTT: 27 s (ref 24–37)

## 2015-08-06 LAB — ABO/RH: ABO/RH(D): AB POS

## 2015-08-06 NOTE — Pre-Procedure Instructions (Signed)
    KEEGHAN BIALY  08/06/2015      [id number] [place], [place] [address] [place] [address] Phone: [phone] Fax: [place], [place] [address] [place] [address] Phone: [phone] Fax: [phone]    Your procedure is scheduled on 08/18/15.  Report to [facility] Admitting at [place] A.[facility].  Call this number if you have problems the morning of surgery:  [phone]   Remember:  Do not eat food or drink liquids after midnight.  Take these medicines the morning of surgery with A SIP OF WATER --prilosec   Do not wear jewelry, make-up or nail polish.  Do not wear lotions, powders, or perfumes.  You may wear deodorant.  Do not shave 48 hours prior to surgery.  Men may shave face and neck.  Do not bring valuables to the hospital.  [facility] is not responsible for any belongings or valuables.  Contacts, dentures or bridgework may not be worn into surgery.  Leave your suitcase in the car.  After surgery it may be brought to your room.  For patients admitted to the hospital, discharge time will be determined by your treatment team.  Patients discharged the day of surgery will not be allowed to drive home.   Name and phone number of your driver:    Special instructions:    Please read over the following fact sheets that you were given. Pain Booklet, Coughing and Deep Breathing, Blood Transfusion Information, MRSA Information and Surgical Site Infection Prevention

## 2015-08-06 NOTE — Progress Notes (Addendum)
Anesthesia Chart Review:  Pt is 64 year old male scheduled for L total hip arthroplasty anterior approach and bilateral total knee cortisone injection on 08/18/2015 with Dr. Berenice Primas.   PMH includes: arthritis. Never smoker. BMI 33.   Preoperative labs reviewed.    Chest x-ray 08/06/2015 reviewed. Normal exam.   EKG 08/06/2015: NSR. Incomplete right bundle branch block  Pt has medical clearance from PCP Dr. Delman Cheadle on paper chart.   If no changes, I anticipate pt can proceed with surgery as scheduled.   Willeen Cass, FNP-BC [facility]/Anesthesiology Phone: [phone] 08/06/2015 2:33 PM

## 2015-08-17 MED ORDER — CEFAZOLIN SODIUM-DEXTROSE 2-3 GM-% IV SOLR
2.0000 g | INTRAVENOUS | Status: AC
  Administered 2015-08-18: 2 g via INTRAVENOUS
  Filled 2015-08-17: qty 50

## 2015-08-18 ENCOUNTER — Inpatient Hospital Stay (HOSPITAL_COMMUNITY): Payer: Managed Care, Other (non HMO) | Admitting: Emergency Medicine

## 2015-08-18 ENCOUNTER — Inpatient Hospital Stay (HOSPITAL_COMMUNITY): Payer: Managed Care, Other (non HMO) | Admitting: Anesthesiology

## 2015-08-18 ENCOUNTER — Encounter (HOSPITAL_COMMUNITY)
Admission: RE | Disposition: A | Discharge status: Home Health | Payer: Self-pay | Source: Ambulatory Visit | Attending: Orthopedic Surgery

## 2015-08-18 ENCOUNTER — Inpatient Hospital Stay (HOSPITAL_COMMUNITY): Payer: Managed Care, Other (non HMO)

## 2015-08-18 ENCOUNTER — Encounter (HOSPITAL_COMMUNITY): Payer: Self-pay | Admitting: *Deleted

## 2015-08-18 ENCOUNTER — Inpatient Hospital Stay (HOSPITAL_COMMUNITY)
Admission: RE | Admit: 2015-08-18 | Discharge: 2015-08-19 | DRG: 470 | Disposition: A | Discharge status: Home Health | Payer: Managed Care, Other (non HMO) | Source: Ambulatory Visit | Attending: Orthopedic Surgery | Admitting: Orthopedic Surgery

## 2015-08-18 DIAGNOSIS — M1712 Unilateral primary osteoarthritis, left knee: Secondary | ICD-10-CM | POA: Diagnosis present

## 2015-08-18 DIAGNOSIS — E663 Overweight: Secondary | ICD-10-CM | POA: Diagnosis present

## 2015-08-18 DIAGNOSIS — M25552 Pain in left hip: Secondary | ICD-10-CM | POA: Diagnosis present

## 2015-08-18 DIAGNOSIS — Z6833 Body mass index (BMI) 33.0-33.9, adult: Secondary | ICD-10-CM | POA: Diagnosis not present

## 2015-08-18 DIAGNOSIS — M1711 Unilateral primary osteoarthritis, right knee: Secondary | ICD-10-CM | POA: Diagnosis present

## 2015-08-18 DIAGNOSIS — M170 Bilateral primary osteoarthritis of knee: Secondary | ICD-10-CM | POA: Diagnosis present

## 2015-08-18 DIAGNOSIS — M1612 Unilateral primary osteoarthritis, left hip: Principal | ICD-10-CM | POA: Diagnosis present

## 2015-08-18 DIAGNOSIS — Z419 Encounter for procedure for purposes other than remedying health state, unspecified: Secondary | ICD-10-CM

## 2015-08-18 HISTORY — PX: TOTAL HIP ARTHROPLASTY: SHX124

## 2015-08-18 HISTORY — PX: INJECTION KNEE: SHX2446

## 2015-08-18 SURGERY — ARTHROPLASTY, HIP, TOTAL, ANTERIOR APPROACH
Anesthesia: Spinal | Site: Knee | Laterality: Left

## 2015-08-18 MED ORDER — PHENYLEPHRINE 40 MCG/ML (10ML) SYRINGE FOR IV PUSH (FOR BLOOD PRESSURE SUPPORT)
PREFILLED_SYRINGE | INTRAVENOUS | Status: AC
  Filled 2015-08-18: qty 10

## 2015-08-18 MED ORDER — METHYLPREDNISOLONE ACETATE 80 MG/ML IJ SUSP
INTRAMUSCULAR | Status: DC | PRN
  Administered 2015-08-18 (×2): 2 mL

## 2015-08-18 MED ORDER — BUPIVACAINE HCL (PF) 0.25 % IJ SOLN
INTRAMUSCULAR | Status: AC
  Filled 2015-08-18: qty 60

## 2015-08-18 MED ORDER — METHOCARBAMOL 1000 MG/10ML IJ SOLN
500.0000 mg | Freq: Four times a day (QID) | INTRAVENOUS | Status: DC | PRN
  Filled 2015-08-18: qty 5

## 2015-08-18 MED ORDER — MIDAZOLAM HCL 2 MG/2ML IJ SOLN
INTRAMUSCULAR | Status: AC
  Filled 2015-08-18: qty 4

## 2015-08-18 MED ORDER — PROPOFOL INFUSION 10 MG/ML OPTIME
INTRAVENOUS | Status: DC | PRN
  Administered 2015-08-18: 50 ug/kg/min via INTRAVENOUS

## 2015-08-18 MED ORDER — BISACODYL 5 MG PO TBEC: 5.0000 mg | DELAYED_RELEASE_TABLET | Freq: Every day | ORAL | Status: DC | PRN

## 2015-08-18 MED ORDER — KETOROLAC TROMETHAMINE 15 MG/ML IJ SOLN
15.0000 mg | Freq: Four times a day (QID) | INTRAMUSCULAR | Status: DC
  Administered 2015-08-18 – 2015-08-19 (×3): 15 mg via INTRAVENOUS
  Filled 2015-08-18 (×4): qty 1

## 2015-08-18 MED ORDER — PROPOFOL 10 MG/ML IV BOLUS
INTRAVENOUS | Status: DC | PRN
  Administered 2015-08-18: 200 mg via INTRAVENOUS

## 2015-08-18 MED ORDER — DIPHENHYDRAMINE HCL 12.5 MG/5ML PO ELIX: 12.5000 mg | ORAL_SOLUTION | ORAL | Status: DC | PRN

## 2015-08-18 MED ORDER — 0.9 % SODIUM CHLORIDE (POUR BTL) OPTIME
TOPICAL | Status: DC | PRN
  Administered 2015-08-18: 1000 mL

## 2015-08-18 MED ORDER — OXYCODONE-ACETAMINOPHEN 5-325 MG PO TABS: 1.0000 | ORAL_TABLET | ORAL | Status: DC | PRN

## 2015-08-18 MED ORDER — PROMETHAZINE HCL 25 MG/ML IJ SOLN
6.2500 mg | INTRAMUSCULAR | Status: DC | PRN
Start: 2015-08-18 — End: 2015-08-18
  Administered 2015-08-18: 6.25 mg via INTRAVENOUS

## 2015-08-18 MED ORDER — METHOCARBAMOL 750 MG PO TABS: 750.0000 mg | ORAL_TABLET | Freq: Three times a day (TID) | ORAL | Status: DC | PRN

## 2015-08-18 MED ORDER — SODIUM CHLORIDE 0.9 % IV SOLN
INTRAVENOUS | Status: DC
  Administered 2015-08-18 – 2015-08-19 (×2): via INTRAVENOUS

## 2015-08-18 MED ORDER — ACETAMINOPHEN 650 MG RE SUPP: 650.0000 mg | Freq: Four times a day (QID) | RECTAL | Status: DC | PRN

## 2015-08-18 MED ORDER — ONDANSETRON HCL 4 MG PO TABS: 4.0000 mg | ORAL_TABLET | Freq: Four times a day (QID) | ORAL | Status: DC | PRN

## 2015-08-18 MED ORDER — ALUM & MAG HYDROXIDE-SIMETH 200-200-20 MG/5ML PO SUSP: 30.0000 mL | ORAL | Status: DC | PRN

## 2015-08-18 MED ORDER — PROPOFOL 10 MG/ML IV BOLUS
INTRAVENOUS | Status: AC
  Filled 2015-08-18: qty 20

## 2015-08-18 MED ORDER — MIDAZOLAM HCL 5 MG/5ML IJ SOLN
INTRAMUSCULAR | Status: DC | PRN
  Administered 2015-08-18: 2 mg via INTRAVENOUS

## 2015-08-18 MED ORDER — CEFAZOLIN SODIUM-DEXTROSE 2-3 GM-% IV SOLR
2.0000 g | Freq: Four times a day (QID) | INTRAVENOUS | Status: AC
  Administered 2015-08-18 – 2015-08-19 (×2): 2 g via INTRAVENOUS
  Filled 2015-08-18 (×2): qty 50

## 2015-08-18 MED ORDER — HYDROMORPHONE HCL 1 MG/ML IJ SOLN
INTRAMUSCULAR | Status: AC
  Filled 2015-08-18: qty 1

## 2015-08-18 MED ORDER — METHYLPREDNISOLONE ACETATE 40 MG/ML IJ SUSP
320.0000 mg | INTRAMUSCULAR | Status: DC
  Filled 2015-08-18: qty 8

## 2015-08-18 MED ORDER — OXYCODONE HCL 5 MG/5ML PO SOLN: 5.0000 mg | Freq: Once | ORAL | Status: AC | PRN

## 2015-08-18 MED ORDER — FENTANYL CITRATE (PF) 250 MCG/5ML IJ SOLN
INTRAMUSCULAR | Status: AC
  Filled 2015-08-18: qty 5

## 2015-08-18 MED ORDER — BUPIVACAINE HCL (PF) 0.5 % IJ SOLN
INTRAMUSCULAR | Status: DC | PRN
  Administered 2015-08-18: 3 mL via INTRATHECAL

## 2015-08-18 MED ORDER — METHOCARBAMOL 500 MG PO TABS
500.0000 mg | ORAL_TABLET | Freq: Four times a day (QID) | ORAL | Status: DC | PRN
Start: 2015-08-18 — End: 2015-08-19
  Administered 2015-08-18 (×2): 500 mg via ORAL
  Filled 2015-08-18 (×3): qty 1

## 2015-08-18 MED ORDER — BUPIVACAINE LIPOSOME 1.3 % IJ SUSP
INTRAMUSCULAR | Status: DC | PRN
  Administered 2015-08-18: 20 mL

## 2015-08-18 MED ORDER — SUCCINYLCHOLINE CHLORIDE 20 MG/ML IJ SOLN
INTRAMUSCULAR | Status: DC | PRN
  Administered 2015-08-18: 120 mg via INTRAVENOUS

## 2015-08-18 MED ORDER — FERROUS SULFATE 325 (65 FE) MG PO TABS
325.0000 mg | ORAL_TABLET | Freq: Every day | ORAL | Status: DC
  Administered 2015-08-19: 325 mg via ORAL
  Filled 2015-08-18: qty 1

## 2015-08-18 MED ORDER — PROMETHAZINE HCL 25 MG/ML IJ SOLN
INTRAMUSCULAR | Status: AC
Start: 2015-08-18 — End: 2015-08-19
  Filled 2015-08-18: qty 1

## 2015-08-18 MED ORDER — TRANEXAMIC ACID 1000 MG/10ML IV SOLN
2000.0000 mg | Freq: Once | INTRAVENOUS | Status: DC
  Filled 2015-08-18: qty 20

## 2015-08-18 MED ORDER — HYDROMORPHONE HCL 1 MG/ML IJ SOLN
0.2500 mg | INTRAMUSCULAR | Status: DC | PRN
  Administered 2015-08-18 (×4): 0.5 mg via INTRAVENOUS

## 2015-08-18 MED ORDER — MAGNESIUM CITRATE PO SOLN
1.0000 | Freq: Once | ORAL | Status: DC | PRN
Start: 2015-08-18 — End: 2015-08-19

## 2015-08-18 MED ORDER — ASPIRIN EC 325 MG PO TBEC: 325.0000 mg | DELAYED_RELEASE_TABLET | Freq: Two times a day (BID) | ORAL | Status: DC

## 2015-08-18 MED ORDER — PANTOPRAZOLE SODIUM 40 MG PO TBEC
80.0000 mg | DELAYED_RELEASE_TABLET | Freq: Every day | ORAL | Status: DC
  Administered 2015-08-19: 80 mg via ORAL
  Filled 2015-08-18: qty 2

## 2015-08-18 MED ORDER — GLYCOPYRROLATE 0.2 MG/ML IJ SOLN
INTRAMUSCULAR | Status: DC | PRN
  Administered 2015-08-18: 0.6 mg via INTRAVENOUS

## 2015-08-18 MED ORDER — ONDANSETRON HCL 4 MG/2ML IJ SOLN: 4.0000 mg | Freq: Four times a day (QID) | INTRAMUSCULAR | Status: DC | PRN

## 2015-08-18 MED ORDER — LACTATED RINGERS IV SOLN
INTRAVENOUS | Status: DC
  Administered 2015-08-18: 12:00:00 via INTRAVENOUS

## 2015-08-18 MED ORDER — METHYLPREDNISOLONE ACETATE 80 MG/ML IJ SUSP
INTRAMUSCULAR | Status: AC
  Filled 2015-08-18: qty 4

## 2015-08-18 MED ORDER — ASPIRIN EC 325 MG PO TBEC
325.0000 mg | DELAYED_RELEASE_TABLET | Freq: Two times a day (BID) | ORAL | Status: DC
  Administered 2015-08-19: 325 mg via ORAL
  Filled 2015-08-18: qty 1

## 2015-08-18 MED ORDER — ZOLPIDEM TARTRATE 5 MG PO TABS: 5.0000 mg | ORAL_TABLET | Freq: Every evening | ORAL | Status: DC | PRN

## 2015-08-18 MED ORDER — TRANEXAMIC ACID 1000 MG/10ML IV SOLN
2000.0000 mg | INTRAVENOUS | Status: DC | PRN
  Administered 2015-08-18: 2000 mg via TOPICAL

## 2015-08-18 MED ORDER — DOCUSATE SODIUM 100 MG PO CAPS
100.0000 mg | ORAL_CAPSULE | Freq: Two times a day (BID) | ORAL | Status: DC
  Administered 2015-08-18 – 2015-08-19 (×2): 100 mg via ORAL
  Filled 2015-08-18 (×2): qty 1

## 2015-08-18 MED ORDER — EPHEDRINE SULFATE 50 MG/ML IJ SOLN
INTRAMUSCULAR | Status: DC | PRN
  Administered 2015-08-18 (×2): 10 mg via INTRAVENOUS

## 2015-08-18 MED ORDER — ALBUMIN HUMAN 5 % IV SOLN
INTRAVENOUS | Status: DC | PRN
  Administered 2015-08-18: 16:00:00 via INTRAVENOUS

## 2015-08-18 MED ORDER — BUPIVACAINE HCL 0.25 % IJ SOLN
INTRAMUSCULAR | Status: DC | PRN
  Administered 2015-08-18 (×2): 6 mL
  Administered 2015-08-18: 20 mL

## 2015-08-18 MED ORDER — CHLORHEXIDINE GLUCONATE 4 % EX LIQD: 60.0000 mL | Freq: Once | CUTANEOUS | Status: DC

## 2015-08-18 MED ORDER — ROCURONIUM BROMIDE 100 MG/10ML IV SOLN
INTRAVENOUS | Status: DC | PRN
  Administered 2015-08-18: 50 mg via INTRAVENOUS

## 2015-08-18 MED ORDER — OXYCODONE-ACETAMINOPHEN 5-325 MG PO TABS
1.0000 | ORAL_TABLET | ORAL | Status: DC | PRN
  Administered 2015-08-18: 2 via ORAL
  Filled 2015-08-18: qty 2

## 2015-08-18 MED ORDER — POLYETHYLENE GLYCOL 3350 17 G PO PACK
17.0000 g | PACK | Freq: Every day | ORAL | Status: DC | PRN
  Administered 2015-08-19: 17 g via ORAL
  Filled 2015-08-18: qty 1

## 2015-08-18 MED ORDER — HYDROMORPHONE HCL 1 MG/ML IJ SOLN
1.0000 mg | INTRAMUSCULAR | Status: DC | PRN
  Administered 2015-08-18: 1 mg via INTRAVENOUS

## 2015-08-18 MED ORDER — BUPIVACAINE LIPOSOME 1.3 % IJ SUSP
20.0000 mL | INTRAMUSCULAR | Status: DC
  Filled 2015-08-18: qty 20

## 2015-08-18 MED ORDER — OXYCODONE HCL 5 MG PO TABS
5.0000 mg | ORAL_TABLET | Freq: Once | ORAL | Status: AC | PRN
  Administered 2015-08-18: 5 mg via ORAL

## 2015-08-18 MED ORDER — OXYCODONE HCL 5 MG PO TABS
ORAL_TABLET | ORAL | Status: AC
  Filled 2015-08-18: qty 1

## 2015-08-18 MED ORDER — PSYLLIUM 95 % PO PACK
2.0000 | PACK | Freq: Every day | ORAL | Status: DC
  Filled 2015-08-18 (×2): qty 2

## 2015-08-18 MED ORDER — ONDANSETRON HCL 4 MG/2ML IJ SOLN
INTRAMUSCULAR | Status: DC | PRN
  Administered 2015-08-18: 4 mg via INTRAVENOUS

## 2015-08-18 MED ORDER — LACTATED RINGERS IV SOLN
INTRAVENOUS | Status: DC | PRN
  Administered 2015-08-18 (×2): via INTRAVENOUS

## 2015-08-18 MED ORDER — ACETAMINOPHEN 325 MG PO TABS: 650.0000 mg | ORAL_TABLET | Freq: Four times a day (QID) | ORAL | Status: DC | PRN

## 2015-08-18 MED ORDER — METHOCARBAMOL 500 MG PO TABS
ORAL_TABLET | ORAL | Status: AC
  Filled 2015-08-18: qty 1

## 2015-08-18 MED ORDER — FENTANYL CITRATE (PF) 100 MCG/2ML IJ SOLN
INTRAMUSCULAR | Status: DC | PRN
  Administered 2015-08-18 (×2): 25 ug via INTRAVENOUS
  Administered 2015-08-18: 100 ug via INTRAVENOUS
  Administered 2015-08-18 (×2): 50 ug via INTRAVENOUS

## 2015-08-18 MED ORDER — NEOSTIGMINE METHYLSULFATE 10 MG/10ML IV SOLN
INTRAVENOUS | Status: DC | PRN
  Administered 2015-08-18: 4 mg via INTRAVENOUS

## 2015-08-18 SURGICAL SUPPLY — 64 items
BAG DECANTER FOR FLEXI CONT (MISCELLANEOUS) ×4 IMPLANT
BENZOIN TINCTURE PRP APPL 2/3 (GAUZE/BANDAGES/DRESSINGS) ×4 IMPLANT
BLADE SAW SGTL 18X1.27X75 (BLADE) ×3 IMPLANT
BLADE SAW SGTL 18X1.27X75MM (BLADE) ×1
BLADE SURG ROTATE 9660 (MISCELLANEOUS) IMPLANT
BNDG COHESIVE 6X5 TAN STRL LF (GAUZE/BANDAGES/DRESSINGS) IMPLANT
BNDG GAUZE ELAST 4 BULKY (GAUZE/BANDAGES/DRESSINGS) IMPLANT
CAPT HIP TOTAL 2 ×4 IMPLANT
CELLS DAT CNTRL 66122 CELL SVR (MISCELLANEOUS) IMPLANT
CLOSURE STERI-STRIP 1/2X4 (GAUZE/BANDAGES/DRESSINGS) ×1
CLSR STERI-STRIP ANTIMIC 1/2X4 (GAUZE/BANDAGES/DRESSINGS) ×3 IMPLANT
COVER PERINEAL POST (MISCELLANEOUS) ×4 IMPLANT
COVER SURGICAL LIGHT HANDLE (MISCELLANEOUS) ×4 IMPLANT
DRAPE C-ARM 42X72 X-RAY (DRAPES) ×4 IMPLANT
DRAPE STERI IOBAN 125X83 (DRAPES) ×4 IMPLANT
DRAPE U-SHAPE 47X51 STRL (DRAPES) ×8 IMPLANT
DRSG AQUACEL AG ADV 3.5X10 (GAUZE/BANDAGES/DRESSINGS) ×4 IMPLANT
DRSG MEPILEX BORDER 4X8 (GAUZE/BANDAGES/DRESSINGS) IMPLANT
DURAPREP 26ML APPLICATOR (WOUND CARE) ×4 IMPLANT
ELECT BLADE 4.0 EZ CLEAN MEGAD (MISCELLANEOUS)
ELECT CAUTERY BLADE 6.4 (BLADE) ×4 IMPLANT
ELECT REM PT RETURN 9FT ADLT (ELECTROSURGICAL) ×4
ELECTRODE BLDE 4.0 EZ CLN MEGD (MISCELLANEOUS) IMPLANT
ELECTRODE REM PT RTRN 9FT ADLT (ELECTROSURGICAL) ×2 IMPLANT
GAUZE XEROFORM 1X8 LF (GAUZE/BANDAGES/DRESSINGS) IMPLANT
GLOVE BIOGEL PI IND STRL 8 (GLOVE) ×4 IMPLANT
GLOVE BIOGEL PI INDICATOR 8 (GLOVE) ×4
GLOVE ECLIPSE 7.5 STRL STRAW (GLOVE) ×8 IMPLANT
GOWN STRL REUS W/ TWL LRG LVL3 (GOWN DISPOSABLE) ×4 IMPLANT
GOWN STRL REUS W/ TWL XL LVL3 (GOWN DISPOSABLE) ×4 IMPLANT
GOWN STRL REUS W/TWL LRG LVL3 (GOWN DISPOSABLE) ×4
GOWN STRL REUS W/TWL XL LVL3 (GOWN DISPOSABLE) ×4
HOOD PEEL AWAY FACE SHEILD DIS (HOOD) ×8 IMPLANT
KIT BASIN OR (CUSTOM PROCEDURE TRAY) ×4 IMPLANT
KIT ROOM TURNOVER OR (KITS) ×4 IMPLANT
MANIFOLD NEPTUNE II (INSTRUMENTS) ×4 IMPLANT
NEEDLE 22X1 1/2 (OR ONLY) (NEEDLE) ×8 IMPLANT
NEEDLE SPNL 22GX3.5 QUINCKE BK (NEEDLE) ×4 IMPLANT
NS IRRIG 1000ML POUR BTL (IV SOLUTION) ×4 IMPLANT
PACK TOTAL JOINT (CUSTOM PROCEDURE TRAY) ×4 IMPLANT
PACK UNIVERSAL I (CUSTOM PROCEDURE TRAY) ×4 IMPLANT
PAD ARMBOARD 7.5X6 YLW CONV (MISCELLANEOUS) ×8 IMPLANT
RTRCTR WOUND ALEXIS 18CM MED (MISCELLANEOUS)
RTRCTR WOUND ALEXIS 18CM SML (INSTRUMENTS)
SAVER CELL AAL HAEMONETICS (INSTRUMENTS) IMPLANT
SPONGE LAP 18X18 X RAY DECT (DISPOSABLE) ×4 IMPLANT
STAPLER VISISTAT 35W (STAPLE) IMPLANT
SUT ETHIBOND NAB CT1 #1 30IN (SUTURE) ×8 IMPLANT
SUT MNCRL AB 3-0 PS2 18 (SUTURE) ×8 IMPLANT
SUT MON AB 3-0 SH 27 (SUTURE) ×2
SUT MON AB 3-0 SH27 (SUTURE) ×2 IMPLANT
SUT VIC AB 0 CT1 27 (SUTURE) ×4
SUT VIC AB 0 CT1 27XBRD ANBCTR (SUTURE) ×4 IMPLANT
SUT VIC AB 1 CT1 27 (SUTURE) ×4
SUT VIC AB 1 CT1 27XBRD ANBCTR (SUTURE) ×4 IMPLANT
SUT VIC AB 2-0 CT1 27 (SUTURE) ×4
SUT VIC AB 2-0 CT1 TAPERPNT 27 (SUTURE) ×4 IMPLANT
SYR 50ML LL SCALE MARK (SYRINGE) ×4 IMPLANT
SYR CONTROL 10ML LL (SYRINGE) ×8 IMPLANT
TOWEL OR 17X24 6PK STRL BLUE (TOWEL DISPOSABLE) ×4 IMPLANT
TOWEL OR 17X26 10 PK STRL BLUE (TOWEL DISPOSABLE) ×4 IMPLANT
TRAY CATH 16FR W/PLASTIC CATH (SET/KITS/TRAYS/PACK) ×4 IMPLANT
TRAY FOLEY CATH 16FR SILVER (SET/KITS/TRAYS/PACK) IMPLANT
WATER STERILE IRR 1000ML POUR (IV SOLUTION) IMPLANT

## 2015-08-18 NOTE — Anesthesia Procedure Notes (Addendum)
Spinal Patient location during procedure: OR Staffing Anesthesiologist: Suzette Battiest Performed by: anesthesiologist  Preanesthetic Checklist Completed: patient identified, site marked, surgical consent, pre-op evaluation, timeout performed, IV checked, risks and benefits discussed and monitors and equipment checked Spinal Block Patient position: sitting Prep: DuraPrep Patient monitoring: heart rate, continuous pulse ox and blood pressure Approach: right paramedian Location: L4-5 Injection technique: single-shot Needle Needle type: Spinocan  Needle gauge: 22 G Needle length: 9 cm Assessment Events: failed spinal Additional Notes Expiration date of kit checked and confirmed. Attempted x3 midline with 24g pencan without return CSF. Right paramedian approach with 22g quinke yielded CSF. Attempted to aspirate and unable to pull CSF into syringe. Syringe removed and CSF flow no longer evident. Repositioned needle with blood in needle. Needle repositioned and blood tinged fluid present and free flowing. Aspiration confirmed prior to injection but unable to aspirate at the end of injection. Pt tolerated well with no immediate complications.   Procedure Name: Intubation Date/Time: 08/18/2015 1:58 PM Performed by: Willeen Cass P Pre-anesthesia Checklist: Patient identified, Timeout performed, Emergency Drugs available, Suction available and Patient being monitored Patient Re-evaluated:Patient Re-evaluated prior to inductionOxygen Delivery Method: Circle system utilized Preoxygenation: Pre-oxygenation with 100% oxygen Intubation Type: IV induction Ventilation: Mask ventilation without difficulty Laryngoscope Size: Mac and 4 Grade View: Grade I Tube type: Oral Tube size: 7.0 mm Number of attempts: 1 Airway Equipment and Method: Stylet Placement Confirmation: ETT inserted through vocal cords under direct vision,  breath sounds checked- equal and bilateral and positive ETCO2 Secured  at: 23 cm Tube secured with: Tape Dental Injury: Teeth and Oropharynx as per pre-operative assessment

## 2015-08-18 NOTE — Discharge Instructions (Signed)

## 2015-08-18 NOTE — Anesthesia Preprocedure Evaluation (Signed)
Anesthesia Evaluation  Patient identified by MRN, date of birth, ID band Patient awake    Reviewed: Allergy & Precautions, H&P , NPO status , Patient's Chart, lab work & pertinent test results  History of Anesthesia Complications Negative for: history of anesthetic complications  Airway Mallampati: II  TM Distance: >3 FB Neck ROM: full    Dental no notable dental hx.    Pulmonary neg pulmonary ROS,    Pulmonary exam normal breath sounds clear to auscultation       Cardiovascular negative cardio ROS Normal cardiovascular exam Rhythm:regular Rate:Normal     Neuro/Psych negative neurological ROS     GI/Hepatic negative GI ROS, Neg liver ROS,   Endo/Other  negative endocrine ROS  Renal/GU negative Renal ROS     Musculoskeletal   Abdominal   Peds  Hematology negative hematology ROS (+)   Anesthesia Other Findings   Reproductive/Obstetrics negative OB ROS                             Anesthesia Physical Anesthesia Plan  ASA: II  Anesthesia Plan: Spinal   Post-op Pain Management:    Induction:   Airway Management Planned: Nasal Cannula  Additional Equipment:   Intra-op Plan:   Post-operative Plan:   Informed Consent: I have reviewed the patients History and Physical, chart, labs and discussed the procedure including the risks, benefits and alternatives for the proposed anesthesia with the patient or authorized representative who has indicated his/her understanding and acceptance.     Plan Discussed with: Anesthesiologist, CRNA and Surgeon  Anesthesia Plan Comments:         Anesthesia Quick Evaluation

## 2015-08-18 NOTE — H&P (Signed)
TOTAL HIP ADMISSION H&P  Patient is admitted for left total hip arthroplasty.  Subjective:  Chief Complaint: left hip pain  HPI: Stanley Kelly, 64 y.o. male, has a history of pain and functional disability in the left hip(s) due to arthritis and patient has failed non-surgical conservative treatments for greater than 12 weeks to include NSAID's and/or analgesics.  Onset of symptoms was gradual starting 4 years ago with gradually worsening course since that time.The patient noted no past surgery on the left hip(s).  Patient currently rates pain in the left hip at 9 out of 10 with activity. Patient has night pain, worsening of pain with activity and weight bearing, trendelenberg gait, pain that interfers with activities of daily living, pain with passive range of motion, crepitus and joint swelling. Patient has evidence of subchondral cysts, subchondral sclerosis, periarticular osteophytes, joint subluxation and joint space narrowing by imaging studies. This condition presents safety issues increasing the risk of falls. This patient has had failure of all reasonable conservative care.  There is no current active infection.  Patient Active Problem List   Diagnosis Date Noted  . History of osteoarthritis 10/07/2014  . Overweight(278.02) 05/16/2012   Past Medical History  Diagnosis Date  . Hx of knee surgery   . Arthritis     Past Surgical History  Procedure Laterality Date  . Knee surgery    . Joint replacement      Prescriptions prior to admission  Medication Sig Dispense Refill Last Dose  . Casanthranol-Docusate Sodium 30-100 MG CAPS Take 1 capsule by mouth every other day.    Past Month at Unknown time  . HYDROcodone-acetaminophen (NORCO) 10-325 MG per tablet Take 1-2 tablets by mouth every 6 (six) hours as needed.   08/18/2015 at 0830  . meloxicam (MOBIC) 15 MG tablet Take 1 tablet (15 mg total) by mouth daily. PATIENT NEEDS OFFICE VISIT FOR ADDITIONAL REFILLS 30 tablet 0 Past Month at  Unknown time  . Multiple Vitamin (MULTIVITAMIN WITH MINERALS) TABS Take 1 tablet by mouth daily.   Past Month at Unknown time  . niacin 50 MG tablet Take 50 mg by mouth daily with breakfast.   Past Month at Unknown time  . Omega 3-6-9 Fatty Acids (OMEGA 3-6-9 COMPLEX PO) Take 2 capsules by mouth daily.    Past Month at Unknown time  . omeprazole (PRILOSEC) 20 MG capsule Take 40 mg by mouth daily.   08/18/2015 at 0830  . psyllium (REGULOID) 0.52 G capsule Take 2 capsules by mouth daily.    Past Month at Unknown time  . Red Yeast Rice 600 MG CAPS Take 600 mg by mouth 2 (two) times daily.    Past Month at Unknown time  . vitamin B-12 (CYANOCOBALAMIN) 1000 MCG tablet Take 1,000 mcg by mouth daily.   Past Month at Unknown time  . vitamin C (ASCORBIC ACID) 500 MG tablet Take 1,000 mg by mouth daily.   Past Month at Unknown time  . vitamin E 100 UNIT capsule Take 200 Units by mouth daily.   Past Month at Unknown time   No Known Allergies  Social History  Substance Use Topics  . Smoking status: Never Smoker   . Smokeless tobacco: Not on file  . Alcohol Use: No    Family History  Problem Relation Age of Onset  . Adopted: Yes     ROS ROS: I have reviewed the patient's review of systems thoroughly and there are no positive responses as relates to the HPI.  Objective:  Physical Exam  Vital signs in last 24 hours: Temp:  [98.6 F (37 C)] 98.6 F (37 C) (09/12 1119) Pulse Rate:  [62] 62 (09/12 1119) Resp:  [20] 20 (09/12 1119) BP: (134)/(86) 134/86 mmHg (09/12 1119) SpO2:  [97 %] 97 % (09/12 1119) Weight:  [208 lb 4 oz (94.462 kg)] 208 lb 4 oz (94.462 kg) (09/12 1119) Well-developed well-nourished patient in no acute distress. Alert and oriented x3 HEENT:within normal limits Cardiac: Regular rate and rhythm Pulmonary: Lungs clear to auscultation Abdomen: Soft and nontender.  Normal active bowel sounds  Musculoskeletal: (Left hip.  Pain with all range of motion.  Limited internal  rotation.  Neurovascular intact distally.  Severe Trendelenburg gait. Labs: Recent Results (from the past 2160 hour(s))  Surgical pcr screen     Status: None   Collection Time: 08/06/15  9:09 AM  Result Value Ref Range   MRSA, PCR NEGATIVE NEGATIVE   Staphylococcus aureus NEGATIVE NEGATIVE    Comment:        The Xpert SA Assay (FDA approved for NASAL specimens in patients over 74 years of age), is one component of a comprehensive surveillance program.  Test performance has been validated by [facility] for patients greater than or equal to 10 year old. It is not intended to diagnose infection nor to guide or monitor treatment.   Urinalysis, Routine w reflex microscopic (not at [facility])     Status: None   Collection Time: 08/06/15  9:09 AM  Result Value Ref Range   Color, Urine YELLOW YELLOW   APPearance CLEAR CLEAR   Specific Gravity, Urine 1.017 1.005 - 1.030   pH 7.0 5.0 - 8.0   Glucose, UA NEGATIVE NEGATIVE mg/dL   Hgb urine dipstick NEGATIVE NEGATIVE   Bilirubin Urine NEGATIVE NEGATIVE   Ketones, ur NEGATIVE NEGATIVE mg/dL   Protein, ur NEGATIVE NEGATIVE mg/dL   Urobilinogen, UA 0.2 0.0 - 1.0 mg/dL   Nitrite NEGATIVE NEGATIVE   Leukocytes, UA NEGATIVE NEGATIVE    Comment: MICROSCOPIC NOT DONE ON URINES WITH NEGATIVE PROTEIN, BLOOD, LEUKOCYTES, NITRITE, OR GLUCOSE <1000 mg/dL.  APTT     Status: None   Collection Time: 08/06/15  9:10 AM  Result Value Ref Range   aPTT 27 24 - 37 seconds  CBC WITH DIFFERENTIAL     Status: Abnormal   Collection Time: 08/06/15  9:10 AM  Result Value Ref Range   WBC 3.5 (L) 4.0 - 10.5 K/uL   RBC 4.72 4.22 - 5.81 MIL/uL   Hemoglobin 14.7 13.0 - 17.0 g/dL   HCT 42.6 39.0 - 52.0 %   MCV 90.3 78.0 - 100.0 fL   MCH 31.1 26.0 - 34.0 pg   MCHC 34.5 30.0 - 36.0 g/dL   RDW 12.6 11.5 - 15.5 %   Platelets 253 150 - 400 K/uL   Neutrophils Relative % 51 43 - 77 %   Neutro Abs 1.8 1.7 - 7.7 K/uL   Lymphocytes Relative 35 12 - 46 %   Lymphs Abs 1.2  0.7 - 4.0 K/uL   Monocytes Relative 9 3 - 12 %   Monocytes Absolute 0.3 0.1 - 1.0 K/uL   Eosinophils Relative 4 0 - 5 %   Eosinophils Absolute 0.2 0.0 - 0.7 K/uL   Basophils Relative 1 0 - 1 %   Basophils Absolute 0.0 0.0 - 0.1 K/uL  Comprehensive metabolic panel     Status: Abnormal   Collection Time: 08/06/15  9:10 AM  Result Value  Ref Range   Sodium 138 135 - 145 mmol/L   Potassium 4.1 3.5 - 5.1 mmol/L   Chloride 106 101 - 111 mmol/L   CO2 25 22 - 32 mmol/L   Glucose, Bld 103 (H) 65 - 99 mg/dL   BUN 15 6 - 20 mg/dL   Creatinine, Ser 0.83 0.61 - 1.24 mg/dL   Calcium 9.7 8.9 - 10.3 mg/dL   Total Protein 6.7 6.5 - 8.1 g/dL   Albumin 4.1 3.5 - 5.0 g/dL   AST 27 15 - 41 U/L   ALT 30 17 - 63 U/L   Alkaline Phosphatase 58 38 - 126 U/L   Total Bilirubin 0.8 0.3 - 1.2 mg/dL   GFR calc non Af Amer >60 >60 mL/min   GFR calc Af Amer >60 >60 mL/min    Comment: (NOTE) The eGFR has been calculated using the CKD EPI equation. This calculation has not been validated in all clinical situations. eGFR's persistently <60 mL/min signify possible Chronic Kidney Disease.    Anion gap 7 5 - 15  Protime-INR     Status: None   Collection Time: 08/06/15  9:10 AM  Result Value Ref Range   Prothrombin Time 13.6 11.6 - 15.2 seconds   INR 1.02 0.00 - 1.49  Type and screen     Status: None   Collection Time: 08/06/15  9:13 AM  Result Value Ref Range   ABO/RH(D) AB POS    Antibody Screen NEG    Sample Expiration 08/20/2015   ABO/Rh     Status: None   Collection Time: 08/06/15  9:13 AM  Result Value Ref Range   ABO/RH(D) AB POS      Estimated body mass index is 33.11 kg/(m^2) as calculated from the following:   Height as of this encounter: 5' 6.5" (1.689 m).   Weight as of this encounter: 208 lb 4 oz (94.462 kg).   Imaging Review Plain radiographs demonstrate severe degenerative joint disease of the left hip(s). The bone quality appears to be fair for age and reported activity  level.  Assessment/Plan:  End stage arthritis, left hip(s)  The patient history, physical examination, clinical judgement of the provider and imaging studies are consistent with end stage degenerative joint disease of the left hip(s) and total hip arthroplasty is deemed medically necessary. The treatment options including medical management, injection therapy, arthroscopy and arthroplasty were discussed at length. The risks and benefits of total hip arthroplasty were presented and reviewed. The risks due to aseptic loosening, infection, stiffness, dislocation/subluxation,  thromboembolic complications and other imponderables were discussed.  The patient acknowledged the explanation, agreed to proceed with the plan and consent was signed. Patient is being admitted for inpatient treatment for surgery, pain control, PT, OT, prophylactic antibiotics, VTE prophylaxis, progressive ambulation and ADL's and discharge planning.The patient is planning to be discharged home with home health services

## 2015-08-18 NOTE — Brief Op Note (Signed)
08/18/2015  3:57 PM  PATIENT:  Stanley Kelly  64 y.o. male  PRE-OPERATIVE DIAGNOSIS:  SEVERE DEGENERATIVE JOINT DISEASE LEFT HIP,KNEE PAIN, Severe endstage DJD Bilateral Knees  POST-OPERATIVE DIAGNOSIS:   Severe endstage DJD Bilateral Knees  PROCEDURE:  Procedure(s): TOTAL HIP ARTHROPLASTY ANTERIOR APPROACH (Left) BILATERAL TOTAL KNEE BILATERAL CORTISONE INJECTION (Bilateral)  SURGEON:  Surgeon(s) and Role:    * Dorna Leitz, MD - Primary  PHYSICIAN ASSISTANT:   ASSISTANTS: bethune   ANESTHESIA:   general  EBL:  Total I/O In: 1250 [I.V.:1000; IV Piggyback:250] Out: 900 [Blood:900]  BLOOD ADMINISTERED:none  DRAINS: none   LOCAL MEDICATIONS USED:  MARCAINE    and OTHER experel  SPECIMEN:  No Specimen  DISPOSITION OF SPECIMEN:  N/A  COUNTS:  YES  TOURNIQUET:  * No tourniquets in log *  DICTATION: .Other Dictation: Dictation Number [id number]  PLAN OF CARE: Admit to inpatient   PATIENT DISPOSITION:  PACU - hemodynamically stable.   Delay start of Pharmacological VTE agent (>24hrs) due to surgical blood loss or risk of bleeding: no

## 2015-08-18 NOTE — Transfer of Care (Signed)
Immediate Anesthesia Transfer of Care Note  Patient: Stanley Kelly  Procedure(s) Performed: Procedure(s): TOTAL HIP ARTHROPLASTY ANTERIOR APPROACH (Left) BILATERAL TOTAL KNEE BILATERAL CORTISONE INJECTION (Bilateral)  Patient Location: PACU  Anesthesia Type:General  Level of Consciousness: awake, alert , oriented and patient cooperative  Airway & Oxygen Therapy: Patient Spontanous Breathing and Patient connected to nasal cannula oxygen  Post-op Assessment: Report given to RN, Post -op Vital signs reviewed and stable and Patient moving all extremities  Post vital signs: Reviewed and stable  Last Vitals:  Filed Vitals:   08/18/15 1119  BP: 134/86  Pulse: 62  Temp: 37 C  Resp: 20    Complications: No apparent anesthesia complications

## 2015-08-19 ENCOUNTER — Encounter (HOSPITAL_COMMUNITY): Payer: Self-pay | Admitting: Orthopedic Surgery

## 2015-08-19 LAB — CBC
HEMATOCRIT: 35.3 % — AB (ref 39.0–52.0)
Hemoglobin: 12.1 g/dL — ABNORMAL LOW (ref 13.0–17.0)
MCH: 30.5 pg (ref 26.0–34.0)
MCHC: 34.3 g/dL (ref 30.0–36.0)
MCV: 88.9 fL (ref 78.0–100.0)
PLATELETS: 233 10*3/uL (ref 150–400)
RBC: 3.97 MIL/uL — ABNORMAL LOW (ref 4.22–5.81)
RDW: 12.4 % (ref 11.5–15.5)
WBC: 9 10*3/uL (ref 4.0–10.5)

## 2015-08-19 LAB — BASIC METABOLIC PANEL
ANION GAP: 8 (ref 5–15)
BUN: 10 mg/dL (ref 6–20)
CALCIUM: 9 mg/dL (ref 8.9–10.3)
CO2: 24 mmol/L (ref 22–32)
CREATININE: 0.9 mg/dL (ref 0.61–1.24)
Chloride: 105 mmol/L (ref 101–111)
GFR calc Af Amer: >60 mL/min (ref >60)
GLUCOSE: 158 mg/dL — AB (ref 65–99)
Potassium: 3.5 mmol/L (ref 3.5–5.1)
Sodium: 137 mmol/L (ref 135–145)

## 2015-08-19 NOTE — Care Management Note (Signed)
Case Management Note  Patient Details  Name: DANEIL BEEM MRN: [id number] Date of Birth: 1951/06/29  Subjective/Objective:              S/p left total hip arthroplasty      Action/Plan: Spoke with patient and his wife about HHC, they selected [facility]. Contacted Miranda at [facility] and set up [place]. Patient stated that he has a rolling walker and did not need 3N1, has elevated toilet, handicap shower. Patent stated that his wife will be available to assist after discharge.    Expected Discharge Date:                  Expected Discharge Plan:  [place]  In-House Referral:  NA  Discharge planning Services  CM Consult  Post Acute Care Choice:  Home Health Choice offered to:  Patient  DME Arranged:    DME Agency:     HH Arranged:  PT [place]:  [place]  Status of Service:  Completed, signed off  Medicare Important Message Given:    Date Medicare IM Given:    Medicare IM give by:    Date Additional Medicare IM Given:    Additional Medicare Important Message give by:     If discussed at [place] of Stay Meetings, dates discussed:    Additional Comments:  Nila Nephew, RN 08/19/2015, 10:45 AM

## 2015-08-19 NOTE — Discharge Summary (Signed)
Patient ID: NIVIN BRANIFF MRN: [id number] DOB/AGE: 1951/10/14 64 y.o.  Admit date: 08/18/2015 Discharge date: 08/19/2015  Admission Diagnoses:  Principal Problem:   Primary osteoarthritis of left hip Active Problems:   Primary osteoarthritis of right knee   Primary osteoarthritis of left knee   Discharge Diagnoses:  Same  Past Medical History  Diagnosis Date  . Hx of knee surgery   . Arthritis     Surgeries: Procedure(s): TOTAL HIP ARTHROPLASTY ANTERIOR APPROACH left side BILATERAL TOTAL KNEE BILATERAL CORTISONE INJECTION on 08/18/2015   Consultants:    Discharged Condition: Improved  Hospital Course: TOURE EDMONDS is an 64 y.o. male who was admitted 08/18/2015 for operative treatment ofPrimary osteoarthritis of left hip. Patient has severe unremitting pain that affects sleep, daily activities, and work/hobbies. After pre-op clearance the patient was taken to the operating room on 08/18/2015 and underwent  Procedure(s): Left TOTAL HIP ARTHROPLASTY ANTERIOR APPROACH BILATERAL TOTAL KNEE BILATERAL CORTISONE INJECTION.    Patient was given perioperative antibiotics: Anti-infectives    Start     Dose/Rate Route Frequency Ordered Stop   08/18/15 2200  ceFAZolin (ANCEF) IVPB 2 g/50 mL premix     2 g 100 mL/hr over 30 Minutes Intravenous Every 6 hours 08/18/15 2058 08/19/15 0648   08/18/15 1315  ceFAZolin (ANCEF) IVPB 2 g/50 mL premix     2 g 100 mL/hr over 30 Minutes Intravenous To ShortStay Surgical 08/17/15 1450 08/18/15 1347       Patient was given sequential compression devices, early ambulation, and chemoprophylaxis to prevent DVT. The patient was ambulating in hall on postop day #1. He was taking by mouth and voiding okay. He denied chest pain or shortness of breath.  Patient benefited maximally from hospital stay and there were no complications.    Recent vital signs: Patient Vitals for the past 24 hrs:  BP Temp Temp src Pulse Resp SpO2 Height Weight  08/19/15 0551 117/76  mmHg 98.3 F (36.8 C) Oral 100 - 97 % - -  08/18/15 2133 129/81 mmHg 98.5 F (36.9 C) Oral 92 - 97 % - -  08/18/15 2045 - 97.2 F (36.2 C) - - - - - -  08/18/15 2000 122/84 mmHg - - 83 - 100 % - -  08/18/15 1900 124/69 mmHg - - 65 - - - -  08/18/15 1815 120/69 mmHg - - (!) 50 - 100 % - -  08/18/15 1800 115/70 mmHg - - 68 - 96 % - -  08/18/15 1745 123/73 mmHg - - (!) 56 - 100 % - -  08/18/15 1730 124/79 mmHg - - (!) 52 - 100 % - -  08/18/15 1700 107/63 mmHg - - (!) 50 - 99 % - -  08/18/15 1645 127/70 mmHg - - (!) 48 - 100 % - -  08/18/15 1640 - - - (!) 51 - - - -  08/18/15 1630 (!) 114/59 mmHg 98 F (36.7 C) - (!) 47 20 99 % - -  08/18/15 1119 134/86 mmHg 98.6 F (37 C) Oral 62 20 97 % 5' 6.5" (1.689 m) 94.462 kg (208 lb 4 oz)     Recent laboratory studies:  Recent Labs  08/19/15 0533  WBC 9.0  HGB 12.1*  HCT 35.3*  PLT 233  NA 137  K 3.5  CL 105  CO2 24  BUN 10  CREATININE 0.90  GLUCOSE 158*  CALCIUM 9.0     Discharge Medications:     Medication List  STOP taking these medications        HYDROcodone-acetaminophen 10-325 MG per tablet  Commonly known as:  NORCO     meloxicam 15 MG tablet  Commonly known as:  MOBIC      TAKE these medications        aspirin EC 325 MG tablet  Take 1 tablet (325 mg total) by mouth 2 (two) times daily after a meal. Take x 1 month post op to decrease risk of blood clots.     Casanthranol-Docusate Sodium 30-100 MG Caps  Take 1 capsule by mouth every other day.     methocarbamol 750 MG tablet  Commonly known as:  ROBAXIN-750  Take 1 tablet (750 mg total) by mouth every 8 (eight) hours as needed for muscle spasms.     multivitamin with minerals Tabs tablet  Take 1 tablet by mouth daily.     niacin 50 MG tablet  Take 50 mg by mouth daily with breakfast.     OMEGA 3-6-9 COMPLEX PO  Take 2 capsules by mouth daily.     omeprazole 20 MG capsule  Commonly known as:  PRILOSEC  Take 40 mg by mouth daily.      oxyCODONE-acetaminophen 5-325 MG per tablet  Commonly known as:  PERCOCET/ROXICET  Take 1-2 tablets by mouth every 4 (four) hours as needed for severe pain.     psyllium 0.52 G capsule  Commonly known as:  REGULOID  Take 2 capsules by mouth daily.     Red Yeast Rice 600 MG Caps  Take 600 mg by mouth 2 (two) times daily.     vitamin B-12 1000 MCG tablet  Commonly known as:  CYANOCOBALAMIN  Take 1,000 mcg by mouth daily.     vitamin C 500 MG tablet  Commonly known as:  ASCORBIC ACID  Take 1,000 mg by mouth daily.     vitamin E 100 UNIT capsule  Take 200 Units by mouth daily.        Diagnostic Studies: Dg Chest 2 View  08/06/2015   CLINICAL DATA:  Preoperative evaluation for hip replacement  EXAM: CHEST  2 VIEW  COMPARISON:  05/16/2012  FINDINGS: Normal heart size, mediastinal contours, and pulmonary vascularity.  Lungs clear.  No pneumothorax.  Bones unremarkable.  IMPRESSION: Normal exam.   Electronically Signed   By: Lavonia Dana M.D.   On: 08/06/2015 09:36   Dg Hip Operative Unilat With Pelvis Left  08/18/2015   CLINICAL DATA:  Left anterior total hip arthroplasty  EXAM: OPERATIVE LEFT HIP (WITH PELVIS IF PERFORMED) 2 VIEWS  TECHNIQUE: Fluoroscopic spot image(s) were submitted for interpretation post-operatively.  COMPARISON:  None.  FINDINGS: Changes of left hip replacement. Normal AP alignment. No visible hardware or bony complicating feature.  IMPRESSION: Left hip replacement without visible complicating feature.   Electronically Signed   By: Rolm Baptise M.D.   On: 08/18/2015 15:51    Disposition: Home with home health physical therapy      Discharge Instructions    Call MD / Call 911    Complete by:  As directed   If you experience chest pain or shortness of breath, CALL 911 and be transported to the hospital emergency room.  If you develope a fever above 101 F, pus (white drainage) or increased drainage or redness at the wound, or calf pain, call your surgeon's office.      Constipation Prevention    Complete by:  As directed   Drink plenty of fluids.  Prune juice may be helpful.  You may use a stool softener, such as Colace (over the counter) 100 mg twice a day.  Use MiraLax (over the counter) for constipation as needed.     Diet general    Complete by:  As directed      Do not sit on low chairs, stoools or toilet seats, as it may be difficult to get up from low surfaces    Complete by:  As directed      Follow the hip precautions as taught in Physical Therapy    Complete by:  As directed      Increase activity slowly as tolerated    Complete by:  As directed      Weight bearing as tolerated    Complete by:  As directed   Laterality:  left  Extremity:  Lower     Weight bearing as tolerated    Complete by:  As directed   Laterality:  left  Extremity:  Lower           Follow-up Information    Follow up with GRAVES,JOHN L, MD. Schedule an appointment as soon as possible for a visit in 2 weeks.   Specialty:  Orthopedic Surgery   Contact information:   [place] [address] [phone]        Signed: Erlene Senters 08/19/2015, 9:30 AM

## 2015-08-19 NOTE — Evaluation (Signed)
Occupational Therapy Evaluation Patient Details Name: Stanley Kelly MRN: [id number] DOB: Feb 18, 1951 Today's Date: 08/19/2015    History of Present Illness 64 y.o. male, has a history of pain and functional disability in the left hip(s) due to arthritis. S/P TOTAL HIP ARTHROPLASTY ANTERIOR APPROACH (Left).   Clinical Impression   Pt admitted with above diagnosis and presenting with deficits listed below. Pt reports he was independent in functional mobility and required min assist with L LB dressing PTA. Wife available for 24 hour supervision/assist upon d/c home. Spoke to pt about use of AE for LB dressing, pt and wife agree that AE is not necessary at this time and wife will continue to help pt with LB dressing until he is able to complete independently. At this time, not recommending skilled OT services.     Follow Up Recommendations  No OT follow up    Equipment Recommendations  None recommended by OT    Recommendations for Other Services       Precautions / Restrictions Precautions Precautions: Anterior Hip Precaution Booklet Issued: No Precaution Comments: direct anterior approach Restrictions Weight Bearing Restrictions: Yes LLE Weight Bearing: Weight bearing as tolerated      Mobility Bed Mobility               General bed mobility comments: Pt was found in recliner and returned to recliner at end of session  Transfers Overall transfer level: Needs assistance   Transfers: Sit to/from Stand Sit to Stand: Min guard              Balance Overall balance assessment: Needs assistance         Standing balance support: No upper extremity supported Standing balance-[name]: Fair                              ADL Overall ADL's : Needs assistance/impaired Eating/Feeding: Set up;Sitting   Grooming: Set up;Standing   Upper Body Bathing: Supervision/ safety;Sitting   Lower Body Bathing: Min guard;Sit to/from stand   Upper Body Dressing :  Supervision/safety;Sitting   Lower Body Dressing: Minimal assistance;Sit to/from stand Lower Body Dressing Details (indicate cue type and reason): Min assist for L sock and shoe Toilet Transfer: Min guard;Ambulation;Comfort height toilet   Toileting- Clothing Manipulation and Hygiene: Min guard;Sit to/from stand   Tub/ Shower Transfer: Walk-in shower;Min guard;Ambulation;Shower seat   Functional mobility during ADLs: Min guard;Rolling walker General ADL Comments: Spoke to pt and spouse about AE for LB dressing on L side. Pt reports that spouse has been helping with L shoe and sock for a long time. Spouse reports that she can continue helping with this until pt is able to do on his own. Pt and spouse agree that there is no need for AE at this time. Demonstrated and educated walk in shower transfer with RW. Pt and wife verbalize understanding and have no questions, pt refused practice of shower transfer     Vision     Perception     Praxis      Pertinent Vitals/Pain Pain Assessment: No/denies pain (just "soreness")     Hand Dominance     Extremity/Trunk Assessment Upper Extremity Assessment Upper Extremity Assessment: Defer to OT evaluation   Lower Extremity Assessment Lower Extremity Assessment: LLE deficits/detail LLE Deficits / Details: Limited AROM/strength secondary to recent surgery. LLE Sensation: decreased light touch   Cervical / Trunk Assessment Cervical / Trunk Assessment: Normal   Communication  Communication Communication: No difficulties   Cognition Arousal/Alertness: Awake/alert Behavior During Therapy: WFL for tasks assessed/performed Overall Cognitive Status: Within Functional Limits for tasks assessed                     General Comments       Exercises       Shoulder Instructions      Home Living Family/patient expects to be discharged to:: Private residence Living Arrangements: Spouse/significant other Available Help at Discharge:  Family Type of Home: House Home Access: Stairs to enter Technical brewer of Steps: 1 Entrance Stairs-Rails: None Home Layout: One level     Bathroom Shower/Tub: Walk-in Hydrologist: Handicapped height Bathroom Accessibility: Yes How Accessible: Accessible via walker Home Equipment: [place] - 2 wheels;Shower seat;Grab bars - toilet;Grab bars - tub/shower          Prior Functioning/Environment Level of Independence: Independent        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    OT Diagnosis: Generalized weakness   OT Problem List:     OT Treatment/Interventions:      OT Goals(Current goals can be found in the care plan section)    OT Frequency:     Barriers to D/C:            Co-evaluation              End of Session    Activity Tolerance: Patient tolerated treatment well Patient left: in chair;with call bell/phone within reach;with family/visitor present   Time: 9629-5284 OT Time Calculation (min): 14 min Charges:  OT General Charges $OT Visit: 1 Procedure OT Evaluation $Initial OT Evaluation Tier I: 1 Procedure G-Codes:    Binnie Kand M.S., OTR/L Pager: [phone]  08/19/2015, 10:18 AM

## 2015-08-19 NOTE — Evaluation (Addendum)
Physical Therapy Evaluation Patient Details Name: Stanley Kelly MRN: [id number] DOB: February 05, 1951 Today's Date: 08/19/2015   History of Present Illness  64 y.o. male, has a history of pain and functional disability in the left hip(s) due to arthritis. S/P TOTAL HIP ARTHROPLASTY ANTERIOR APPROACH (Left).  Clinical Impression  Patient presents with post surgical deficits LLE s/p Lt THA impacting mobility. Tolerated ambulation and transfers Min guard assist for safety. Cues for RW management. Pt will have support from wife at home. Very motivated. Plan for bed mobility, gait training and stair training this PM as pt plans to d/c home today. Will follow acutely to maximize independence and mobility prior to return home.     Follow Up Recommendations Home health PT;Supervision - Intermittent    Equipment Recommendations  None recommended by PT    Recommendations for Other Services       Precautions / Restrictions Precautions Precautions: Anterior Hip Precaution Booklet Issued: No Precaution Comments: direct anterior approach Restrictions Weight Bearing Restrictions: Yes LLE Weight Bearing: Weight bearing as tolerated      Mobility  Bed Mobility Overal bed mobility: Needs Assistance Bed Mobility: Supine to Sit     Supine to sit: Supervision;HOB elevated     General bed mobility comments: Supervision for safety.   Transfers Overall transfer level: Needs assistance Equipment used: Rolling walker (2 wheeled) Transfers: Sit to/from Stand Sit to Stand: Min guard         General transfer comment: Min guard for safety. Cues for hand placement/technique.  Ambulation/Gait Ambulation/Gait assistance: Min guard Ambulation Distance (Feet): 120 Feet Assistive device: Rolling walker (2 wheeled) Gait Pattern/deviations: Step-through pattern;Step-to pattern;Decreased stance time - left;Decreased step length - right;Trunk flexed;Antalgic   Gait velocity interpretation: <1.8 ft/sec,  indicative of risk for recurrent falls General Gait Details: Cues for knee extension during stance phase to activate quadriceps.  Stairs            Wheelchair Mobility    Modified Rankin (Stroke Patients Only)       Balance Overall balance assessment: Needs assistance Sitting-balance support: Feet supported;No upper extremity supported Sitting balance-[name]: Good     Standing balance support: During functional activity Standing balance-[name]: Fair Standing balance comment: tolerated dynamic standing - washing hands, brushing  teeth without UE support.                              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  Cervical / Trunk Assessment: Normal  Communication   Communication: No difficulties  Cognition Arousal/Alertness: Awake/alert Behavior During Therapy: WFL for tasks assessed/performed Overall Cognitive Status: Within Functional Limits for tasks assessed  General Comments General comments (skin integrity, edema, etc.): Wife present during end of PT evaluation.    Exercises Total Joint Exercises Ankle Circles/Pumps:  Both;10 reps;Supine Quad Sets: Both;10 reps;Supine Long Arc Quad: Both;10 reps;Seated;Strengthening      Assessment/Plan    PT Assessment Patient needs continued PT services  PT Diagnosis Difficulty walking;Generalized weakness   PT Problem List Decreased strength;Decreased range of motion;Impaired sensation;Decreased balance;Decreased mobility;Decreased knowledge of use of DME  PT Treatment Interventions Balance training;Stair training;Gait training;Therapeutic activities;Therapeutic exercise;Functional mobility training;Patient/family education;DME instruction   PT Goals (Current goals can be found in the Care Plan section) Acute Rehab PT Goals Patient Stated Goal: to be able to walk normal again PT Goal Formulation: With patient Time For Goal Achievement: 09/02/15 Potential to Achieve Goals: Good    Frequency 7X/week   Barriers to discharge        Co-evaluation               End of Session Equipment Utilized During Treatment: Gait belt Activity Tolerance: Patient tolerated treatment well Patient left: in chair;with call bell/phone within reach;with family/visitor present Nurse Communication: Mobility status         Time: 7253-6644 PT Time Calculation (min) (ACUTE ONLY): 34 min   Charges:   PT Evaluation $Initial PT Evaluation Tier I: 1 Procedure PT Treatments $Gait Training: 8-22 mins   PT G Codes:        Trisha Ken A Zorah Backes 08/19/2015, 10:20 AM Wray Kearns, PT, DPT [phone]

## 2015-08-19 NOTE — Op Note (Signed)
NAMEMarland Kitchen  [place], [place]                   ACCOUNT NO.:  [id number]  MEDICAL RECORD NO.:  [id number]  LOCATION:  [id number]                        FACILITY:  [facility]  PHYSICIAN:  Alta Corning, M.D.   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  PROCEDURE: 1. Left total hip replacement from an anterior approach with a Pinnacle Gription cup size 56 mm, a 13 mm Corail stem high offset and a +12 ceramic hip ball.  2.intra-articular injection left knee with 6 cc Marcaine and 2 cc 80 mg per cc Depo-Medral.  3.intra-articular injection right knee with 6 cc Marcaine and 2 cc 80 mg per cc Depo-Medrol.   SURGEON:  Alta Corning, MD  ASSISTANT:  Modena Slater.  ANESTHESIA:  General.  BRIEF HISTORY:  Mr. Matsumura is a 64 year old male with a long history of having severe arthritis of the left hip.  He has been treated conservatively for prolonged period of time after failure of all conservative care.  He was taken to the operating room for left total hip replacement.  X-ray shows severe flattening of the head, severe malalignment of the head and socket and after he had failed all reasonable nonoperative treatment, he was brought to the operating for left total hip replacement.  Because of his young age and active lifestyle, we felt that anterior approach was appropriate and while we are aware of the possibility of anatomical variants, we felt that it could be handled from an anterior approach, was brought to the operating room for this procedure.  DESCRIPTION OF PROCEDURE:  The patient was brought to the operating room.  After adequate anesthesia was obtained with general anesthetic, the  patient was placed supine on operating table.  The patient was then placed in traction boots and onto the Hana bed.  Once this was done, attention was turned to the left hip which was prepped and draped in usual sterile fashion.  Following this, an incision was made for an anterior approach to the hip, subcutaneous tissue was dissected down to the level of the tensor fascia.  The fascia was divided.  The muscle was finger delivered and the retractors were put in place.  Once this was done, the vessels going into the anterior portion of the neck were cauterized and retractors were then put in place.  The anterior hip capsule was opened and tagged. Retractors were then replaced and the provisional neck cut was made. Once this was done, attention was turned towards the removal of the femoral head.  Once this was done, we reamed up to a level of 53 mm for 54 cup.  I felt that we really had medialized and I thought this was appropriate to try to get better coverage laterally as he did have a bit of a shallow acetabulum.  We went back and reamed medially a bunch and then reamed up to a  level of 55 mm to fit a 56 mm Pinnacle Gription cup and hammered this into place.  Once this was into place and the hole eliminator was placed followed by a +4 neutral liner.  Once that was put in place, attention was turned to the stem side where after releases were performed, the hip was placed into an externally rotated extended and adducted position, then releases were performed.  The hip was then rasped up to a level of 13 mm.  A high offset trial was used with a +0 initially.  Ultimately, I felt that to symmetrisize the leg length, we went to a +12 hip ball, this gave [place] pretty close to symmetric leg length.  I felt the high offset was critical and at this point, once this was completed, the final stem was opened and placed 13 mm high offset Corail stem with a +12 ceramic hip ball was placed.   Reduction was undertaken.  Final x-rays were taken.  Leg lengths were assessed and felt to be almost symmetric, maybe still a touch sure, which I thought was a better place for him.  We extended him 90 degrees, externally rotated him 100 degree and would not dislocate in that position.  At this point, we felt that we had excellent alignment.  The wound was irrigated.  We put tranexamic acid 2 g of 50 mL of saline into the wound for 5 minutes and following this, removed this and closed the capsule with 1 Vicryl running, and closed the tensor fascia with 1 Vicryl running.  The skin was closed with 0 and 2-0 Vicryl and 3-0 Monocryl subcuticular.  Benzoin and Steri-Strips were applied.  Sterile compressive dressing was applied.  The patient was taken to the recovery room, was noted to be in satisfactory condition.  Estimated blood loss for procedure was 900 mL.     Alta Corning, M.D.     Corliss Skains  D:  08/18/2015  T:  08/19/2015  Job:  [id number]

## 2015-08-19 NOTE — Progress Notes (Signed)
Physical Therapy Treatment Patient Details Name: Stanley Kelly MRN: [id number] DOB: 1951-05-04 Today's Date: 08/19/2015    History of Present Illness 64 y.o. male, has a history of pain and functional disability in the left hip(s) due to arthritis. S/P TOTAL HIP ARTHROPLASTY ANTERIOR APPROACH (Left).    PT Comments    Patient progressing well toward PT goals. Improved ambulation distance and gait mechanics with cues. Performed stair training this PM. Discussed elevation, exercise, icing and techniques how to get into/out of car. Pt safe to discharge from a mobility standpoint. Will follow acutely to maximize independence and mobility if still in hospital tomorrow.   Follow Up Recommendations  Home health PT;Supervision - Intermittent     Equipment Recommendations  None recommended by PT    Recommendations for Other Services       Precautions / Restrictions Precautions Precaution Comments: direct anterior approach Restrictions Weight Bearing Restrictions: Yes LLE Weight Bearing: Weight bearing as tolerated    Mobility  Bed Mobility Overal bed mobility: Needs Assistance Bed Mobility: Supine to Sit;Sit to Supine     Supine to sit: Modified independent (Device/Increase time) Sit to supine: Modified independent (Device/Increase time)   General bed mobility comments: HOB flat, no use of rails to simulate home environment.Cues how to use UEs to bring LLE into/out of bed.  Transfers Overall transfer level: Needs assistance Equipment used: Rolling walker (2 wheeled) Transfers: Sit to/from Stand Sit to Stand: Supervision         General transfer comment: SUpervision for safety. Stood from [employer], from chair x1, from [employer].  Ambulation/Gait Ambulation/Gait assistance: Supervision Ambulation Distance (Feet): 150 Feet Assistive device: Rolling walker (2 wheeled) Gait Pattern/deviations: Step-through pattern;Decreased stride length;Antalgic;Decreased stance time -  left;Decreased step length - right   Gait velocity interpretation: <1.8 ft/sec, indicative of risk for recurrent falls General Gait Details: Cues for knee extension during stance phase to activate quadriceps and for hip/knee flexion to decreased circumduction.   Stairs Stairs: Yes Stairs assistance: Supervision Stair Management: Backwards;With walker Number of Stairs: 1 General stair comments: Cues for technique.  Wheelchair Mobility    Modified Rankin (Stroke Patients Only)       Balance Overall balance assessment: Needs assistance Sitting-balance support: Feet supported;No upper extremity supported Sitting balance-[name]: Good     Standing balance support: During functional activity Standing balance-[name]: Fair                      Cognition Arousal/Alertness: Awake/alert Behavior During Therapy: WFL for tasks assessed/performed Overall Cognitive Status: Within Functional Limits for tasks assessed                      Exercises      General Comments        Pertinent Vitals/Pain Pain Assessment: Faces ("soreness") Faces Pain Scale: Hurts a little bit Pain Location: left hip Pain Descriptors / Indicators: Sore Pain Intervention(s): Monitored during session;Repositioned    Home Living                      Prior Function            PT Goals (current goals can now be found in the care plan section) Progress towards PT goals: Progressing toward goals    Frequency  7X/week    PT Plan Current plan remains appropriate    Co-evaluation             End  of Session Equipment Utilized During Treatment: Gait belt Activity Tolerance: Patient tolerated treatment well Patient left: in chair;with call bell/phone within reach;with family/visitor present     Time: 1352-1410 PT Time Calculation (min) (ACUTE ONLY): 18 min  Charges:  $Gait Training: 8-22 mins                    G Codes:      Delphine Sizemore A  Rafael Salway 08/19/2015, 2:49 PM Wray Kearns, [place], DPT [phone]

## 2015-08-19 NOTE — Progress Notes (Signed)
Utilization review completed. Kaliah Haddaway, RN, BSN. 

## 2015-08-19 NOTE — Progress Notes (Signed)
Subjective: 1 Day Post-Op Procedure(s) (LRB): TOTAL HIP ARTHROPLASTY ANTERIOR APPROACH (Left) BILATERAL TOTAL KNEE BILATERAL CORTISONE INJECTION (Bilateral) Patient reports pain as mild.  Ambulating in the hall with physical therapy. Taking food by mouth and voiding okay. No shortness of breath or chest pain.  Objective: Vital signs in last 24 hours: Temp:  [97.2 F (36.2 C)-98.6 F (37 C)] 98.3 F (36.8 C) (09/13 0551) Pulse Rate:  [47-100] 100 (09/13 0551) Resp:  [20] 20 (09/12 1630) BP: (107-134)/(59-86) 117/76 mmHg (09/13 0551) SpO2:  [96 %-100 %] 97 % (09/13 0551) Weight:  [94.462 kg (208 lb 4 oz)] 94.462 kg (208 lb 4 oz) (09/12 1119)  Intake/Output from previous day: 09/12 0701 - 09/13 0700 In: 2538.3 [I.V.:2188.3; IV Piggyback:350] Out: 3200 [Urine:1950; Blood:1250] Intake/Output this shift:     Recent Labs  08/19/15 0533  HGB 12.1*    Recent Labs  08/19/15 0533  WBC 9.0  RBC 3.97*  HCT 35.3*  PLT 233    Recent Labs  08/19/15 0533  NA 137  K 3.5  CL 105  CO2 24  BUN 10  CREATININE 0.90  GLUCOSE 158*  CALCIUM 9.0   No results for input(s): LABPT, INR in the last 72 hours. Left hip exam: Neurovascular intact Sensation intact distally Intact pulses distally Dorsiflexion/Plantar flexion intact Incision: dressing C/D/I Compartment soft  Assessment/Plan: 1 Day Post-Op Procedure(s) (LRB): TOTAL HIP ARTHROPLASTY ANTERIOR APPROACH (Left) BILATERAL TOTAL KNEE BILATERAL CORTISONE INJECTION (Bilateral) Plan: Weight-bear as tolerated on left without hip precautions. Aspirin 325 mg twice daily with SCDs for DVT prophylaxis. Up with therapy Discharge home with home health after physical therapy this afternoon. Follow-up with Dr. Berenice Primas in 2 weeks in the office.  Samuele Storey G 08/19/2015, 9:25 AM

## 2015-08-20 NOTE — Anesthesia Postprocedure Evaluation (Signed)
  Anesthesia Post-op Note  Patient: Stanley Kelly  Procedure(s) Performed: Procedure(s): TOTAL HIP ARTHROPLASTY ANTERIOR APPROACH (Left) BILATERAL TOTAL KNEE BILATERAL CORTISONE INJECTION (Bilateral)  Patient Location: PACU  Anesthesia Type:General  Level of Consciousness: awake and alert   Airway and Oxygen Therapy: Patient Spontanous Breathing  Post-op Pain: mild  Post-op Assessment: Post-op Vital signs reviewed LLE Motor Response: Purposeful movement, Responds to commands LLE Sensation: Full sensation RLE Motor Response: Purposeful movement, Responds to commands RLE Sensation: Full sensation      Post-op Vital Signs: Reviewed  Last Vitals:  Filed Vitals:   08/19/15 0551  BP: 117/76  Pulse: 100  Temp: 36.8 C  Resp:     Complications: No apparent anesthesia complications

## 2016-03-09 IMAGING — RF DG HIP (WITH PELVIS) OPERATIVE*L*
1 series · 2 of 2 positions shown · non-contrast
Comparison: None.

CLINICAL DATA: Left anterior total hip arthroplasty

EXAM:
OPERATIVE LEFT HIP (WITH PELVIS IF PERFORMED) 2 VIEWS
TECHNIQUE: Fluoroscopic spot image(s) were submitted for interpretation
post-operatively.

[Series 1: run · 2 of 2 slices shown]
[im 1/2]
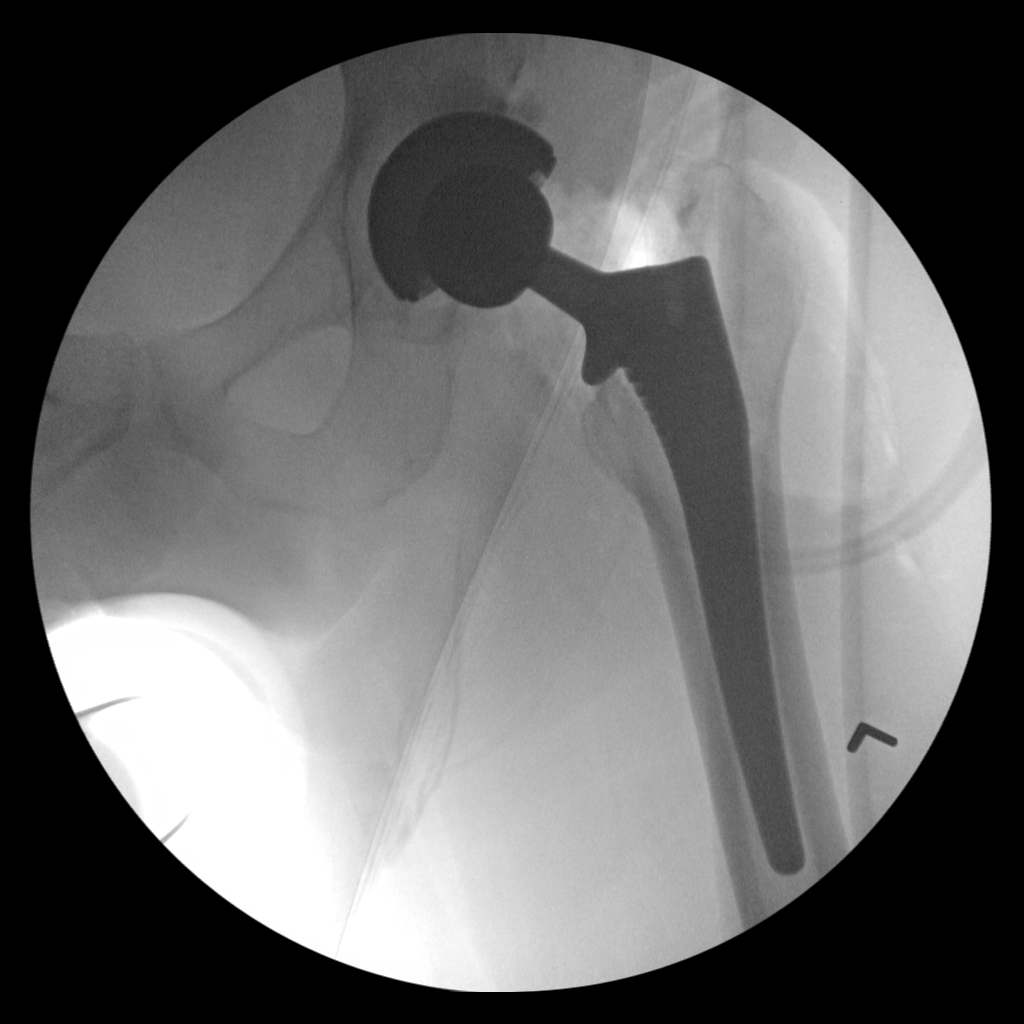
[im 2/2]
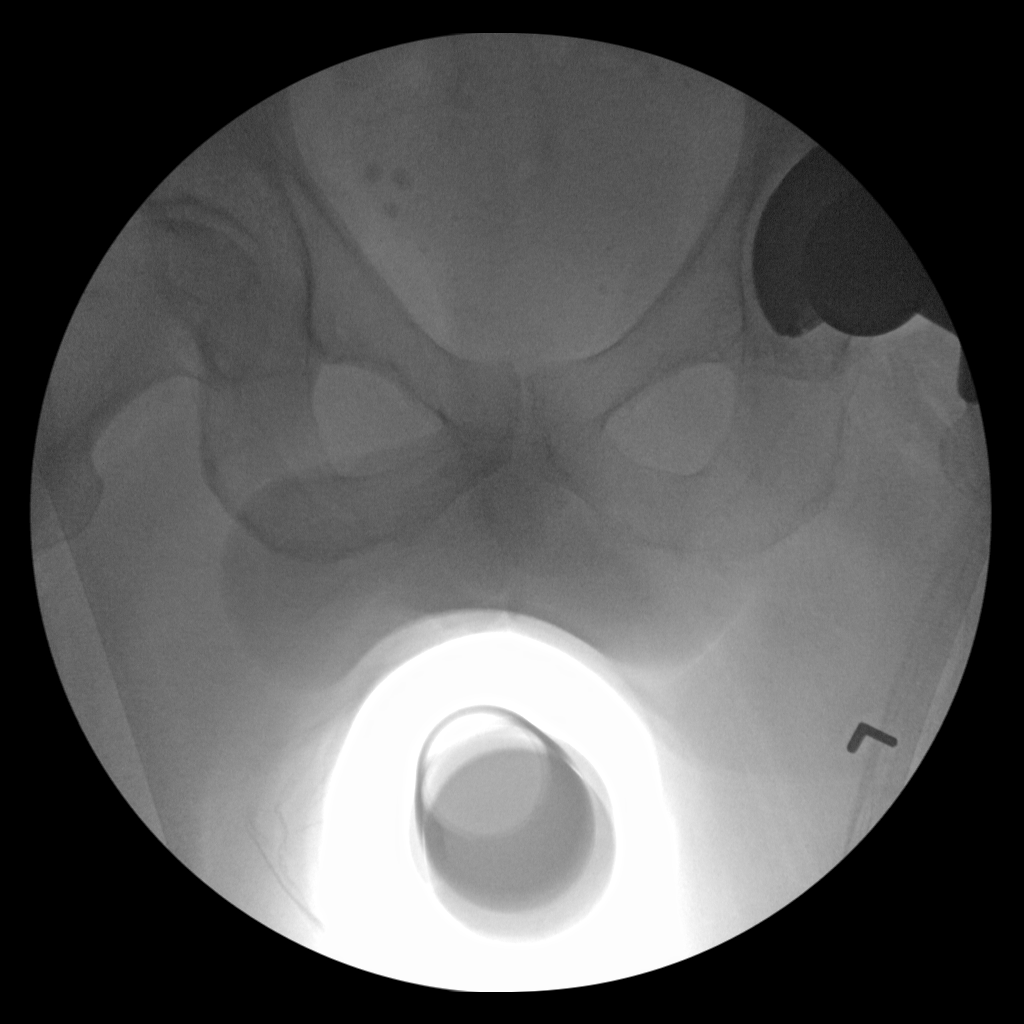

[2 of 2 positions shown; findings below may reference images not displayed]

FINDINGS: Changes of left hip replacement. Normal AP alignment. No visible
hardware or bony complicating feature.
IMPRESSION: Left hip replacement without visible complicating feature.

## 2016-05-17 ENCOUNTER — Ambulatory Visit (INDEPENDENT_AMBULATORY_CARE_PROVIDER_SITE_OTHER): Payer: Managed Care, Other (non HMO) | Admitting: Physician Assistant

## 2016-05-17 VITALS — BP 122/72 | HR 68 | Temp 98.0°F | Resp 17 | Ht 67.5 in | Wt 216.0 lb

## 2016-05-17 DIAGNOSIS — Z13228 Encounter for screening for other metabolic disorders: Secondary | ICD-10-CM

## 2016-05-17 DIAGNOSIS — Z130 Encounter for screening for diseases of the blood and blood-forming organs and certain disorders involving the immune mechanism: Secondary | ICD-10-CM | POA: Diagnosis not present

## 2016-05-17 DIAGNOSIS — Z0000 Encounter for general adult medical examination without abnormal findings: Secondary | ICD-10-CM

## 2016-05-17 DIAGNOSIS — Z1211 Encounter for screening for malignant neoplasm of colon: Secondary | ICD-10-CM | POA: Diagnosis not present

## 2016-05-17 DIAGNOSIS — Z01818 Encounter for other preprocedural examination: Secondary | ICD-10-CM | POA: Diagnosis not present

## 2016-05-17 DIAGNOSIS — E7800 Pure hypercholesterolemia, unspecified: Secondary | ICD-10-CM | POA: Diagnosis not present

## 2016-05-17 LAB — COMPLETE METABOLIC PANEL WITH GFR
ALT: 42 U/L (ref 9–46)
AST: 32 U/L (ref 10–35)
Albumin: 5 g/dL (ref 3.6–5.1)
Alkaline Phosphatase: 65 U/L (ref 40–115)
BILIRUBIN TOTAL: 0.5 mg/dL (ref 0.2–1.2)
BUN: 25 mg/dL (ref 7–25)
CHLORIDE: 103 mmol/L (ref 98–110)
CO2: 25 mmol/L (ref 20–31)
CREATININE: 0.88 mg/dL (ref 0.70–1.25)
Calcium: 10 mg/dL (ref 8.6–10.3)
GFR, Est African American: >89 mL/min (ref >=60)
GFR, Est Non African American: >89 mL/min (ref >=60)
GLUCOSE: 99 mg/dL (ref 65–99)
Potassium: 4.2 mmol/L (ref 3.5–5.3)
SODIUM: 138 mmol/L (ref 135–146)
TOTAL PROTEIN: 7.3 g/dL (ref 6.1–8.1)

## 2016-05-17 LAB — CBC WITH DIFFERENTIAL/PLATELET
BASOS PCT: 1 %
Basophils Absolute: 34 cells/uL (ref 0–200)
EOS ABS: 102 {cells}/uL (ref 15–500)
EOS PCT: 3 %
HCT: 43.9 % (ref 38.5–50.0)
Hemoglobin: 15.4 g/dL (ref 13.2–17.1)
Lymphocytes Relative: 30 %
Lymphs Abs: 1020 cells/uL (ref 850–3900)
MCH: 31.1 pg (ref 27.0–33.0)
MCHC: 35.1 g/dL (ref 32.0–36.0)
MCV: 88.7 fL (ref 80.0–100.0)
MONOS PCT: 9 %
MPV: 9.6 fL (ref 7.5–12.5)
Monocytes Absolute: 306 cells/uL (ref 200–950)
NEUTROS ABS: 1938 {cells}/uL (ref 1500–7800)
Neutrophils Relative %: 57 %
PLATELETS: 268 10*3/uL (ref 140–400)
RBC: 4.95 MIL/uL (ref 4.20–5.80)
RDW: 13.2 % (ref 11.0–15.0)
WBC: 3.4 10*3/uL — AB (ref 3.8–10.8)

## 2016-05-17 LAB — LIPID PANEL
Cholesterol: 192 mg/dL (ref 125–200)
HDL: 46 mg/dL (ref >=40)
LDL Cholesterol: 121 mg/dL (ref <130)
TRIGLYCERIDES: 127 mg/dL (ref <150)
Total CHOL/HDL Ratio: 4.2 Ratio (ref <=5.0)
VLDL: 25 mg/dL (ref <30)

## 2016-05-17 NOTE — Patient Instructions (Addendum)
Keeping you healthy  Get these tests  Blood pressure- Have your blood pressure checked once a year by your healthcare provider.  Normal blood pressure is 120/80  Weight- Have your body mass index (BMI) calculated to screen for obesity.  BMI is a measure of body fat based on height and weight. You can also calculate your own BMI at [email or web address].com/bmi/.  Cholesterol- Have your cholesterol checked every year.  Diabetes- Have your blood sugar checked regularly if you have high blood pressure, high cholesterol, have a family history of diabetes or if you are overweight.  Screening for Colon Cancer- Colonoscopy starting at age 50.  Screening may begin sooner depending on your family history and other health conditions. Follow up colonoscopy as directed by your Gastroenterologist.  Screening for Prostate Cancer- Both blood work (PSA) and a rectal exam help screen for Prostate Cancer.  Screening begins at age 40 with African-American men and at age 50 with Caucasian men.  Screening may begin sooner depending on your family history.  Take these medicines  Aspirin- One aspirin daily can help prevent Heart disease and Stroke.  Flu shot- Every fall.  Tetanus- Every 10 years.  Zostavax- Once after the age of 60 to prevent Shingles.  Pneumonia shot- Once after the age of 65; if you are younger than 65, ask your healthcare provider if you need a Pneumonia shot.  Take these steps  Don't smoke- If you do smoke, talk to your doctor about quitting.  For tips on how to quit, go to www.smokefree.gov or call 1-800-QUIT-NOW.  Be physically active- Exercise 5 days a week for at least 30 minutes.  If you are not already physically active start slow and gradually work up to 30 minutes of moderate physical activity.  Examples of moderate activity include walking briskly, mowing the yard, dancing, swimming, bicycling, etc.  Eat a healthy diet- Eat a variety of healthy food such as fruits, vegetables, low  fat milk, low fat cheese, yogurt, lean meant, poultry, fish, beans, tofu, etc. For more information go to www.thenutritionsource.org  Drink alcohol in moderation- Limit alcohol intake to less than two drinks a day. Never drink and drive.  Dentist- Brush and floss twice daily; visit your dentist twice a year.  Depression- Your emotional health is as important as your physical health. If you're feeling down, or losing interest in things you would normally enjoy please talk to your healthcare provider.  Eye exam- Visit your eye doctor every year.  Safe sex- If you may be exposed to a sexually transmitted infection, use a condom.  Seat belts- Seat belts can save your life; always wear one.  Smoke/Carbon Monoxide detectors- These detectors need to be installed on the appropriate level of your home.  Replace batteries at least once a year.  Skin cancer- When out in the sun, cover up and use sunscreen 15 SPF or higher.  Violence- If anyone is threatening you, please tell your healthcare provider.  Living Will/ Health care power of attorney- Speak with your healthcare provider and family.    IF you received an x-ray today, you will receive an invoice from [facility]. Please contact [facility] at [phone] with questions or concerns regarding your invoice.   IF you received labwork today, you will receive an invoice from [facility]. Please contact [facility] at [phone] with questions or concerns regarding your invoice.   Our billing staff will not be able to assist you with questions regarding bills from these companies.    You will be contacted with the lab results as soon as they are available. The fastest way to get your results is to activate your My Chart account. Instructions are located on the last page of this paperwork. If you have not heard from us regarding the results in 2 weeks, please contact this office.      

## 2016-05-17 NOTE — Progress Notes (Signed)
Stanley Kelly  MRN: [id number] DOB: Oct 22, 1951  Subjective:  Pt presents to clinic for a CPE.  He is planning on having both knees replaced in 9/17.  He plans to have 1 done and then the other done 2 weeks afterwards.  He knows he has gained weight over the last year or so but he is unable to do much physical activity due to there pain.  He otherwise is good.  Left hip replaced 9/16 - went really well  Last dental exam: every 6 months Last vision exam: wears glasses - Dec 2016  Last colonoscopy: about 4-5 years ago - unsure about when he needs it done again - unsure if he had polyps removed  Vaccinations      Tetanus 2013  Patient Active Problem List   Diagnosis Date Noted  . Primary osteoarthritis of left hip 08/18/2015  . Primary osteoarthritis of right knee 08/18/2015  . Primary osteoarthritis of left knee 08/18/2015  . History of osteoarthritis 10/07/2014  . Overweight(278.02) 05/16/2012    Current Outpatient Prescriptions on File Prior to Visit  Medication Sig Dispense Refill  . aspirin EC 325 MG tablet Take 1 tablet (325 mg total) by mouth 2 (two) times daily after a meal. Take x 1 month post op to decrease risk of blood clots. 60 tablet 0  . Casanthranol-Docusate Sodium 30-100 MG CAPS Take 1 capsule by mouth every other day.     . methocarbamol (ROBAXIN-750) 750 MG tablet Take 1 tablet (750 mg total) by mouth every 8 (eight) hours as needed for muscle spasms. 50 tablet 0  . Multiple Vitamin (MULTIVITAMIN WITH MINERALS) TABS Take 1 tablet by mouth daily.    . niacin 50 MG tablet Take 50 mg by mouth daily with breakfast.    . Omega 3-6-9 Fatty Acids (OMEGA 3-6-9 COMPLEX PO) Take 2 capsules by mouth daily.     Marland Kitchen omeprazole (PRILOSEC) 20 MG capsule Take 40 mg by mouth daily.    Marland Kitchen oxyCODONE-acetaminophen (PERCOCET/ROXICET) 5-325 MG per tablet Take 1-2 tablets by mouth every 4 (four) hours as needed for severe pain. 60 tablet 0  . psyllium (REGULOID) 0.52 G capsule Take 2  capsules by mouth daily.     . Red Yeast Rice 600 MG CAPS Take 600 mg by mouth 2 (two) times daily.     . vitamin B-12 (CYANOCOBALAMIN) 1000 MCG tablet Take 1,000 mcg by mouth daily.    . vitamin C (ASCORBIC ACID) 500 MG tablet Take 1,000 mg by mouth daily.    . vitamin E 100 UNIT capsule Take 200 Units by mouth daily.     No current facility-administered medications on file prior to visit.    No Known Allergies  Social History   Social History  . Marital Status: Married    Spouse Name: N/A  . Number of Children: N/A  . Years of Education: N/A   Social History Main Topics  . Smoking status: Never Smoker   . Smokeless tobacco: None  . Alcohol Use: No  . Drug Use: No  . Sexual Activity: Yes   Other Topics Concern  . None   Social History Narrative    Past Surgical History  Procedure Laterality Date  . Knee surgery    . Joint replacement    . Total hip arthroplasty Left 08/18/2015    Procedure: TOTAL HIP ARTHROPLASTY ANTERIOR APPROACH;  Surgeon: Dorna Leitz, MD;  Location: [facility];  Service: Orthopedics;  Laterality: Left;  . Injection knee  Bilateral 08/18/2015    Procedure: BILATERAL TOTAL KNEE BILATERAL CORTISONE INJECTION;  Surgeon: Dorna Leitz, MD;  Location: [facility];  Service: Orthopedics;  Laterality: Bilateral;    Family History  Problem Relation Age of Onset  . Adopted: Yes    Review of Systems  Constitutional: Negative.   HENT: Negative.   Eyes: Negative.   Respiratory: Negative.   Cardiovascular: Negative.   Gastrointestinal: Negative.   Endocrine: Negative.   Genitourinary: Negative.   Musculoskeletal: Negative.   Skin: Negative.   Allergic/Immunologic: Negative.   Neurological: Negative.   Hematological: Negative.   Psychiatric/Behavioral: Negative.    Objective:  BP 122/72 mmHg  Pulse 68  Temp(Src) 98 F (36.7 C) (Oral)  Resp 17  Ht 5' 7.5" (1.715 m)  Wt 216 lb (97.977 kg)  BMI 33.31 kg/m2  SpO2 96%  Physical Exam  Constitutional: He is  oriented to person, place, and time and well-developed, well-nourished, and in no distress.  HENT:  Head: Normocephalic and atraumatic.  Right Ear: Hearing, tympanic membrane, external ear and ear canal normal.  Left Ear: Hearing, tympanic membrane, external ear and ear canal normal.  Nose: Nose normal.  Mouth/Throat: Uvula is midline, oropharynx is clear and moist and mucous membranes are normal.  Eyes: Conjunctivae and EOM are normal. Pupils are equal, round, and reactive to light.  Neck: Trachea normal and normal range of motion. Neck supple. No thyroid mass and no thyromegaly present.  Cardiovascular: Normal rate, regular rhythm and normal heart sounds.   No murmur heard. Pulmonary/Chest: Effort normal and breath sounds normal.  Abdominal: Soft. Bowel sounds are normal.  Genitourinary: Prostate normal, testes/scrotum normal and penis normal. Prostate is not enlarged and not tender.  Musculoskeletal: Normal range of motion.  Neurological: He is alert and oriented to person, place, and time. Gait normal.  Skin: Skin is warm and dry.  Psychiatric: Mood, memory, affect and judgment normal.    Visual Acuity Screening   Right eye Left eye Both eyes  Without correction:     With correction: 20/25 20/40 20/30    EKG - incomplete right BBB - otherwise normal and no change since 07/2015  Assessment and Plan :  Annual physical exam  Screening for metabolic disorder - Plan: COMPLETE METABOLIC PANEL WITH GFR  Screening for deficiency anemia - Plan: CBC with Differential/Platelet  Elevated cholesterol - Plan: Lipid panel  Screen for colon cancer - Plan: IFOBT POC (occult bld, rslt in office)  Pre-operative clearance - Plan: EKG 12-Lead   He is ok to have surgery.  Continue to have healthy eating habits and as much activity that his knees with handle at this time.  Windell Hummingbird PA-C  [facility] 05/17/2016 10:31 AM

## 2016-05-21 LAB — IFOBT (OCCULT BLOOD): IMMUNOLOGICAL FECAL OCCULT BLOOD TEST: NEGATIVE

## 2016-06-11 ENCOUNTER — Other Ambulatory Visit: Payer: Self-pay | Admitting: Orthopedic Surgery

## 2016-06-24 NOTE — Pre-Procedure Instructions (Signed)
TRACER PALMS  06/24/2016      [id number] - [place], [place] [address] [place] [address] Phone: [phone] Fax: [place], [place] [address] [place] [address] Phone: [phone] Fax: [phone]    Your procedure is scheduled on July 31  Report to [facility] Admitting.    8:00 A.M. Call the operating room Desk [phone]. Arrive 2 hours prior to surgery time  Call this number if you have problems the morning of surgery:  [phone]   Remember:  Do not eat food or drink liquids after midnight.    Take these medicines the morning of surgery with A SIP OF WATER omeprazole (prilosec),    Do not wear jewelry.  Do not wear lotions, powders, or cologne.  You may NOT wear deoderant.  Men may shave face and neck.  Do not bring valuables to the hospital.  [facility] is not responsible for any belongings or valuables.  Contacts, dentures or bridgework may not be worn into surgery.  Leave your suitcase in the car.  After surgery it may be brought to your room.  For patients admitted to the hospital, discharge time will be determined by your treatment team.  Patients discharged the day of surgery will not be allowed to drive home.    Special instructions:   - Preparing For Surgery  Before surgery, you can play an important role. Because skin is not sterile, your skin needs to be as free of germs as possible. You can reduce the number of germs on your skin by washing with CHG (chlorahexidine gluconate) Soap before surgery.  CHG is an antiseptic cleaner which kills germs and bonds with the skin to continue killing germs even after washing.  Please do not use if you have an allergy to CHG or antibacterial soaps. If your skin becomes reddened/irritated stop using the CHG.  Do not shave (including legs and underarms) for at  least 48 hours prior to first CHG shower. It is OK to shave your face.  Please follow these instructions carefully.   1. Shower the NIGHT BEFORE SURGERY and the MORNING OF SURGERY with CHG.   2. If you chose to wash your hair, wash your hair first as usual with your normal shampoo.  3. After you shampoo, rinse your hair and body thoroughly to remove the shampoo.  4. Use CHG as you would any other liquid soap. You can apply CHG directly to the skin and wash gently with a scrungie or a clean washcloth.   5. Apply the CHG Soap to your body ONLY FROM THE NECK DOWN.  Do not use on open wounds or open sores. Avoid contact with your eyes, ears, mouth and genitals (private parts). Wash genitals (private parts) with your normal soap.  6. Wash thoroughly, paying special attention to the area where your surgery will be performed.  7. Thoroughly rinse your body with warm water from the neck down.  8. DO NOT shower/wash with your normal soap after using and rinsing off the CHG Soap.  9. Pat yourself dry with a CLEAN TOWEL.   10. Wear CLEAN PAJAMAS   11. Place CLEAN SHEETS on your bed the night of your first shower and DO NOT SLEEP WITH PETS.    Day of Surgery: Do not apply any deodorants/lotions. Please wear clean clothes to the hospital/surgery  center.      Please read over the following fact sheets that you were given. MRSA Information

## 2016-06-25 ENCOUNTER — Encounter (HOSPITAL_COMMUNITY)
Admission: RE | Admit: 2016-06-25 | Discharge: 2016-06-25 | Disposition: A | Discharge status: Home / Self Care | Payer: Managed Care, Other (non HMO) | Source: Ambulatory Visit | Attending: Orthopedic Surgery | Admitting: Orthopedic Surgery

## 2016-06-25 ENCOUNTER — Encounter (HOSPITAL_COMMUNITY): Payer: Self-pay

## 2016-06-25 DIAGNOSIS — Z0183 Encounter for blood typing: Secondary | ICD-10-CM | POA: Diagnosis not present

## 2016-06-25 DIAGNOSIS — Z01818 Encounter for other preprocedural examination: Secondary | ICD-10-CM | POA: Insufficient documentation

## 2016-06-25 DIAGNOSIS — Z01812 Encounter for preprocedural laboratory examination: Secondary | ICD-10-CM | POA: Insufficient documentation

## 2016-06-25 DIAGNOSIS — M1711 Unilateral primary osteoarthritis, right knee: Secondary | ICD-10-CM | POA: Diagnosis not present

## 2016-06-25 DIAGNOSIS — Z96642 Presence of left artificial hip joint: Secondary | ICD-10-CM | POA: Diagnosis not present

## 2016-06-25 LAB — COMPREHENSIVE METABOLIC PANEL
ALBUMIN: 4.1 g/dL (ref 3.5–5.0)
ALT: 25 U/L (ref 17–63)
AST: 24 U/L (ref 15–41)
Alkaline Phosphatase: 65 U/L (ref 38–126)
Anion gap: 6 (ref 5–15)
BUN: 13 mg/dL (ref 6–20)
CHLORIDE: 107 mmol/L (ref 101–111)
CO2: 25 mmol/L (ref 22–32)
CREATININE: 0.97 mg/dL (ref 0.61–1.24)
Calcium: 9.7 mg/dL (ref 8.9–10.3)
GFR calc Af Amer: >60 mL/min (ref >60)
GLUCOSE: 110 mg/dL — AB (ref 65–99)
Potassium: 3.9 mmol/L (ref 3.5–5.1)
SODIUM: 138 mmol/L (ref 135–145)
Total Bilirubin: 0.5 mg/dL (ref 0.3–1.2)
Total Protein: 6.5 g/dL (ref 6.5–8.1)

## 2016-06-25 LAB — CBC WITH DIFFERENTIAL/PLATELET
BASOS ABS: 0 10*3/uL (ref 0.0–0.1)
BASOS PCT: 1 %
EOS PCT: 3 %
Eosinophils Absolute: 0.1 10*3/uL (ref 0.0–0.7)
HCT: 41.4 % (ref 39.0–52.0)
Hemoglobin: 14.2 g/dL (ref 13.0–17.0)
LYMPHS PCT: 45 %
Lymphs Abs: 1.4 10*3/uL (ref 0.7–4.0)
MCH: 30.7 pg (ref 26.0–34.0)
MCHC: 34.3 g/dL (ref 30.0–36.0)
MCV: 89.4 fL (ref 78.0–100.0)
Monocytes Absolute: 0.3 10*3/uL (ref 0.1–1.0)
Monocytes Relative: 10 %
Neutro Abs: 1.3 10*3/uL — ABNORMAL LOW (ref 1.7–7.7)
Neutrophils Relative %: 41 %
PLATELETS: 262 10*3/uL (ref 150–400)
RBC: 4.63 MIL/uL (ref 4.22–5.81)
RDW: 12.5 % (ref 11.5–15.5)
WBC: 3 10*3/uL — AB (ref 4.0–10.5)

## 2016-06-25 LAB — TYPE AND SCREEN
ABO/RH(D): AB POS
ANTIBODY SCREEN: NEGATIVE

## 2016-06-25 LAB — URINALYSIS, ROUTINE W REFLEX MICROSCOPIC
BILIRUBIN URINE: NEGATIVE
Glucose, UA: NEGATIVE mg/dL
Hgb urine dipstick: NEGATIVE
KETONES UR: NEGATIVE mg/dL
LEUKOCYTES UA: NEGATIVE
NITRITE: NEGATIVE
PROTEIN: NEGATIVE mg/dL
Specific Gravity, Urine: 1.018 (ref 1.005–1.030)
pH: 5.5 (ref 5.0–8.0)

## 2016-06-25 LAB — SURGICAL PCR SCREEN
MRSA, PCR: NEGATIVE
STAPHYLOCOCCUS AUREUS: NEGATIVE

## 2016-06-25 LAB — PROTIME-INR
INR: 1.05 (ref 0.00–1.49)
Prothrombin Time: 13.9 seconds (ref 11.6–15.2)

## 2016-06-25 LAB — APTT: APTT: 28 s (ref 24–37)

## 2016-06-25 NOTE — Progress Notes (Signed)
PCP: Dr. Delman Cheadle, @ [facility] [address] : clearance under notes in epic.

## 2016-06-29 NOTE — Progress Notes (Signed)
Anesthesia Chart Review:  Pt is 65 year old male scheduled for right TKA on 07/05/16 by Dr. Berenice Primas.   PMH includes: arthritis, never smoker, s/p left THA 08/18/15. BMI 31.   Seen by Windell Hummingbird, PA-C at [facility] on 05/17/16 with labs and EKG and felt okay to have surgery.   Preoperative labs reviewed.    Chest x-ray 08/06/2015 reviewed. Normal exam.   EKG 08/06/2015: NSR. Incomplete right bundle branch block. By notes, EKG on 05/17/16 showed: "incomplete right BBB - otherwise normal and no change since 07/2015." I cannot currently access EKG tracing through [facility] or [facility]. (I've asked nursing staff to request tracing from [facility].)  If no changes, I anticipate pt can proceed with surgery as scheduled.   George Hugh [facility]Anesthesiology Phone [phone] 06/29/2016 6:47 PM

## 2016-07-01 NOTE — Progress Notes (Addendum)
I attempted to call [facility] 7/27, to get 05/2016 EKG for this patient.   I dialed [phone], after listening to the prompts the phone rang multiple times with no one answering.  I did attempt 4 times with same result.   I even tried using their fax [phone] and it wouldn't  go through.

## 2016-07-01 NOTE — Progress Notes (Signed)
[facility] 7/27, to get 05/2016 EKG for this patient.  Spoke with Medical Records, EKG will be faxed to [phone] today.

## 2016-07-04 MED ORDER — CHLORHEXIDINE GLUCONATE 4 % EX LIQD: 60.0000 mL | Freq: Once | CUTANEOUS | Status: DC

## 2016-07-04 MED ORDER — CEFAZOLIN SODIUM-DEXTROSE 2-4 GM/100ML-% IV SOLN
2.0000 g | INTRAVENOUS | Status: AC
  Administered 2016-07-05: 2 g via INTRAVENOUS
  Filled 2016-07-04: qty 100

## 2016-07-05 ENCOUNTER — Encounter (HOSPITAL_COMMUNITY)
Admission: RE | Disposition: A | Discharge status: Home / Self Care | Payer: Self-pay | Source: Ambulatory Visit | Attending: Orthopedic Surgery

## 2016-07-05 ENCOUNTER — Inpatient Hospital Stay (HOSPITAL_COMMUNITY)
Admission: RE | Admit: 2016-07-05 | Discharge: 2016-07-06 | DRG: 470 | Disposition: A | Discharge status: Home / Self Care | Payer: Managed Care, Other (non HMO) | Source: Ambulatory Visit | Attending: Orthopedic Surgery | Admitting: Orthopedic Surgery

## 2016-07-05 ENCOUNTER — Encounter (HOSPITAL_COMMUNITY): Payer: Self-pay | Admitting: Anesthesiology

## 2016-07-05 ENCOUNTER — Inpatient Hospital Stay (HOSPITAL_COMMUNITY): Payer: Managed Care, Other (non HMO) | Admitting: Vascular Surgery

## 2016-07-05 ENCOUNTER — Inpatient Hospital Stay (HOSPITAL_COMMUNITY): Payer: Managed Care, Other (non HMO) | Admitting: Anesthesiology

## 2016-07-05 DIAGNOSIS — M170 Bilateral primary osteoarthritis of knee: Secondary | ICD-10-CM | POA: Diagnosis present

## 2016-07-05 DIAGNOSIS — Z6831 Body mass index (BMI) 31.0-31.9, adult: Secondary | ICD-10-CM

## 2016-07-05 DIAGNOSIS — E663 Overweight: Secondary | ICD-10-CM | POA: Diagnosis present

## 2016-07-05 DIAGNOSIS — Z96642 Presence of left artificial hip joint: Secondary | ICD-10-CM | POA: Diagnosis present

## 2016-07-05 DIAGNOSIS — M25561 Pain in right knee: Secondary | ICD-10-CM | POA: Diagnosis present

## 2016-07-05 DIAGNOSIS — Z7982 Long term (current) use of aspirin: Secondary | ICD-10-CM

## 2016-07-05 DIAGNOSIS — Z96659 Presence of unspecified artificial knee joint: Secondary | ICD-10-CM

## 2016-07-05 HISTORY — PX: TOTAL KNEE ARTHROPLASTY: SHX125

## 2016-07-05 SURGERY — ARTHROPLASTY, KNEE, TOTAL
Anesthesia: Spinal | Site: Knee | Laterality: Right

## 2016-07-05 MED ORDER — LACTATED RINGERS IV SOLN
INTRAVENOUS | Status: DC | PRN
  Administered 2016-07-05 (×2): via INTRAVENOUS

## 2016-07-05 MED ORDER — MORPHINE SULFATE (PF) 2 MG/ML IV SOLN
2.0000 mg | INTRAVENOUS | Status: DC | PRN
  Administered 2016-07-05 – 2016-07-06 (×5): 2 mg via INTRAVENOUS
  Filled 2016-07-05 (×5): qty 1

## 2016-07-05 MED ORDER — TRANEXAMIC ACID 1000 MG/10ML IV SOLN
1000.0000 mg | INTRAVENOUS | Status: AC
  Administered 2016-07-05: 1000 mg via INTRAVENOUS
  Filled 2016-07-05: qty 10

## 2016-07-05 MED ORDER — BUPIVACAINE LIPOSOME 1.3 % IJ SUSP
INTRAMUSCULAR | Status: DC | PRN
  Administered 2016-07-05: 20 mL

## 2016-07-05 MED ORDER — CEFAZOLIN SODIUM-DEXTROSE 2-4 GM/100ML-% IV SOLN
2.0000 g | Freq: Four times a day (QID) | INTRAVENOUS | Status: AC
  Administered 2016-07-05 (×2): 2 g via INTRAVENOUS
  Filled 2016-07-05 (×2): qty 100

## 2016-07-05 MED ORDER — BUPIVACAINE HCL (PF) 0.5 % IJ SOLN
INTRAMUSCULAR | Status: DC | PRN
  Administered 2016-07-05: 30 mL via INTRA_ARTICULAR

## 2016-07-05 MED ORDER — PHENOL 1.4 % MT LIQD
1.0000 | OROMUCOSAL | Status: DC | PRN
Start: 2016-07-05 — End: 2016-07-07

## 2016-07-05 MED ORDER — METOCLOPRAMIDE HCL 5 MG/ML IJ SOLN: 5.0000 mg | Freq: Three times a day (TID) | INTRAMUSCULAR | Status: DC | PRN

## 2016-07-05 MED ORDER — SODIUM CHLORIDE 0.9 % IJ SOLN
INTRAMUSCULAR | Status: DC | PRN
  Administered 2016-07-05: 20 mL

## 2016-07-05 MED ORDER — PHENYLEPHRINE HCL 10 MG/ML IJ SOLN
INTRAVENOUS | Status: DC | PRN
  Administered 2016-07-05: 10 ug/min via INTRAVENOUS

## 2016-07-05 MED ORDER — MENTHOL 3 MG MT LOZG: 1.0000 | LOZENGE | OROMUCOSAL | Status: DC | PRN

## 2016-07-05 MED ORDER — ALUM & MAG HYDROXIDE-SIMETH 200-200-20 MG/5ML PO SUSP: 30.0000 mL | ORAL | Status: DC | PRN

## 2016-07-05 MED ORDER — PANTOPRAZOLE SODIUM 40 MG PO TBEC: 40.0000 mg | DELAYED_RELEASE_TABLET | Freq: Every day | ORAL | Status: DC | PRN

## 2016-07-05 MED ORDER — BUPIVACAINE LIPOSOME 1.3 % IJ SUSP
20.0000 mL | INTRAMUSCULAR | Status: DC
  Filled 2016-07-05: qty 20

## 2016-07-05 MED ORDER — METHOCARBAMOL 500 MG PO TABS
ORAL_TABLET | ORAL | Status: AC
  Filled 2016-07-05: qty 1

## 2016-07-05 MED ORDER — ACETAMINOPHEN 650 MG RE SUPP: 650.0000 mg | Freq: Four times a day (QID) | RECTAL | Status: DC | PRN

## 2016-07-05 MED ORDER — PROPOFOL 500 MG/50ML IV EMUL
INTRAVENOUS | Status: DC | PRN
  Administered 2016-07-05: 50 ug/kg/min via INTRAVENOUS

## 2016-07-05 MED ORDER — LACTATED RINGERS IV SOLN
INTRAVENOUS | Status: DC
  Administered 2016-07-05: 11:00:00 via INTRAVENOUS

## 2016-07-05 MED ORDER — TEMAZEPAM 15 MG PO CAPS: 15.0000 mg | ORAL_CAPSULE | Freq: Every evening | ORAL | Status: DC | PRN

## 2016-07-05 MED ORDER — SODIUM CHLORIDE 0.9 % IV SOLN
1000.0000 mg | Freq: Once | INTRAVENOUS | Status: AC
  Administered 2016-07-05: 1000 mg via INTRAVENOUS
  Filled 2016-07-05: qty 10

## 2016-07-05 MED ORDER — ONDANSETRON HCL 4 MG/2ML IJ SOLN: 4.0000 mg | Freq: Once | INTRAMUSCULAR | Status: DC | PRN

## 2016-07-05 MED ORDER — DEXAMETHASONE SODIUM PHOSPHATE 10 MG/ML IJ SOLN
10.0000 mg | Freq: Once | INTRAMUSCULAR | Status: AC
  Administered 2016-07-06: 10 mg via INTRAVENOUS
  Filled 2016-07-05: qty 1

## 2016-07-05 MED ORDER — FENTANYL CITRATE (PF) 100 MCG/2ML IJ SOLN
INTRAMUSCULAR | Status: DC | PRN
  Administered 2016-07-05: 100 ug via INTRAVENOUS

## 2016-07-05 MED ORDER — ACETAMINOPHEN 325 MG PO TABS: 650.0000 mg | ORAL_TABLET | Freq: Four times a day (QID) | ORAL | Status: DC | PRN

## 2016-07-05 MED ORDER — HYDROMORPHONE HCL 1 MG/ML IJ SOLN: 0.5000 mg | INTRAMUSCULAR | Status: DC | PRN

## 2016-07-05 MED ORDER — FENTANYL CITRATE (PF) 100 MCG/2ML IJ SOLN
INTRAMUSCULAR | Status: AC
  Filled 2016-07-05: qty 2

## 2016-07-05 MED ORDER — FENTANYL CITRATE (PF) 250 MCG/5ML IJ SOLN
INTRAMUSCULAR | Status: AC
  Filled 2016-07-05: qty 5

## 2016-07-05 MED ORDER — DIPHENHYDRAMINE HCL 12.5 MG/5ML PO ELIX: 12.5000 mg | ORAL_SOLUTION | ORAL | Status: DC | PRN

## 2016-07-05 MED ORDER — EPHEDRINE 5 MG/ML INJ
INTRAVENOUS | Status: AC
  Filled 2016-07-05: qty 10

## 2016-07-05 MED ORDER — METOCLOPRAMIDE HCL 5 MG PO TABS: 5.0000 mg | ORAL_TABLET | Freq: Three times a day (TID) | ORAL | Status: DC | PRN

## 2016-07-05 MED ORDER — KETOROLAC TROMETHAMINE 15 MG/ML IJ SOLN
15.0000 mg | Freq: Four times a day (QID) | INTRAMUSCULAR | Status: AC
  Administered 2016-07-05 – 2016-07-06 (×4): 15 mg via INTRAVENOUS
  Filled 2016-07-05 (×4): qty 1

## 2016-07-05 MED ORDER — CELECOXIB 200 MG PO CAPS
200.0000 mg | ORAL_CAPSULE | Freq: Two times a day (BID) | ORAL | Status: DC
  Administered 2016-07-05 – 2016-07-06 (×2): 200 mg via ORAL
  Filled 2016-07-05 (×2): qty 1

## 2016-07-05 MED ORDER — SUCCINYLCHOLINE CHLORIDE 200 MG/10ML IV SOSY
PREFILLED_SYRINGE | INTRAVENOUS | Status: AC
  Filled 2016-07-05: qty 10

## 2016-07-05 MED ORDER — ONDANSETRON HCL 4 MG PO TABS: 4.0000 mg | ORAL_TABLET | Freq: Four times a day (QID) | ORAL | Status: DC | PRN

## 2016-07-05 MED ORDER — SORBITOL 70 % SOLN: 30.0000 mL | Freq: Every day | Status: DC | PRN

## 2016-07-05 MED ORDER — OXYCODONE HCL 5 MG PO TABS
5.0000 mg | ORAL_TABLET | ORAL | Status: DC | PRN
  Administered 2016-07-05: 10 mg via ORAL
  Filled 2016-07-05: qty 2

## 2016-07-05 MED ORDER — SUGAMMADEX SODIUM 200 MG/2ML IV SOLN
INTRAVENOUS | Status: AC
  Filled 2016-07-05: qty 2

## 2016-07-05 MED ORDER — FERROUS SULFATE 325 (65 FE) MG PO TABS
325.0000 mg | ORAL_TABLET | Freq: Three times a day (TID) | ORAL | Status: DC
  Administered 2016-07-05 – 2016-07-06 (×4): 325 mg via ORAL
  Filled 2016-07-05 (×4): qty 1

## 2016-07-05 MED ORDER — 0.9 % SODIUM CHLORIDE (POUR BTL) OPTIME
TOPICAL | Status: DC | PRN
  Administered 2016-07-05: 1000 mL

## 2016-07-05 MED ORDER — BUPIVACAINE HCL (PF) 0.5 % IJ SOLN
INTRAMUSCULAR | Status: AC
  Filled 2016-07-05: qty 30

## 2016-07-05 MED ORDER — MIDAZOLAM HCL 2 MG/2ML IJ SOLN
INTRAMUSCULAR | Status: AC
  Filled 2016-07-05: qty 2

## 2016-07-05 MED ORDER — GLYCOPYRROLATE 0.2 MG/ML IV SOSY
PREFILLED_SYRINGE | INTRAVENOUS | Status: AC
  Filled 2016-07-05: qty 3

## 2016-07-05 MED ORDER — ASPIRIN EC 325 MG PO TBEC
325.0000 mg | DELAYED_RELEASE_TABLET | Freq: Every day | ORAL | Status: DC
  Administered 2016-07-06: 325 mg via ORAL
  Filled 2016-07-05: qty 1

## 2016-07-05 MED ORDER — PROPOFOL 10 MG/ML IV BOLUS
INTRAVENOUS | Status: DC | PRN
  Administered 2016-07-05: 25 mg via INTRAVENOUS

## 2016-07-05 MED ORDER — ONDANSETRON HCL 4 MG/2ML IJ SOLN
4.0000 mg | Freq: Four times a day (QID) | INTRAMUSCULAR | Status: DC | PRN
  Administered 2016-07-05: 4 mg via INTRAVENOUS
  Filled 2016-07-05: qty 2

## 2016-07-05 MED ORDER — METHOCARBAMOL 500 MG PO TABS
500.0000 mg | ORAL_TABLET | Freq: Four times a day (QID) | ORAL | Status: DC | PRN
  Administered 2016-07-05: 500 mg via ORAL

## 2016-07-05 MED ORDER — DEXTROSE 5 % IV SOLN
500.0000 mg | Freq: Four times a day (QID) | INTRAVENOUS | Status: DC | PRN
  Filled 2016-07-05: qty 5

## 2016-07-05 MED ORDER — PHENYLEPHRINE 40 MCG/ML (10ML) SYRINGE FOR IV PUSH (FOR BLOOD PRESSURE SUPPORT)
PREFILLED_SYRINGE | INTRAVENOUS | Status: AC
  Filled 2016-07-05: qty 10

## 2016-07-05 MED ORDER — HYDROCODONE-ACETAMINOPHEN 7.5-325 MG PO TABS
1.0000 | ORAL_TABLET | Freq: Four times a day (QID) | ORAL | Status: DC
  Administered 2016-07-05 – 2016-07-06 (×5): 1 via ORAL
  Filled 2016-07-05 (×5): qty 1

## 2016-07-05 SURGICAL SUPPLY — 68 items
BANDAGE ELASTIC 6 VELCRO ST LF (GAUZE/BANDAGES/DRESSINGS) ×3 IMPLANT
BANDAGE ESMARK 6X9 LF (GAUZE/BANDAGES/DRESSINGS) ×1 IMPLANT
BENZOIN TINCTURE PRP APPL 2/3 (GAUZE/BANDAGES/DRESSINGS) ×3 IMPLANT
BLADE SAGITTAL 25.0X1.19X90 (BLADE) ×2 IMPLANT
BLADE SAGITTAL 25.0X1.19X90MM (BLADE) ×1
BLADE SAW SAG 90X13X1.27 (BLADE) ×3 IMPLANT
BNDG ESMARK 6X9 LF (GAUZE/BANDAGES/DRESSINGS) ×3
BOWL SMART MIX CTS (DISPOSABLE) ×3 IMPLANT
CAP KNEE TOTAL 3 SIGMA ×3 IMPLANT
CEMENT HV SMART SET (Cement) ×6 IMPLANT
CLOSURE STERI-STRIP 1/2X4 (GAUZE/BANDAGES/DRESSINGS) ×1
CLOSURE WOUND 1/2 X4 (GAUZE/BANDAGES/DRESSINGS) ×2
CLSR STERI-STRIP ANTIMIC 1/2X4 (GAUZE/BANDAGES/DRESSINGS) ×2 IMPLANT
COVER SURGICAL LIGHT HANDLE (MISCELLANEOUS) ×3 IMPLANT
CUFF TOURNIQUET SINGLE 34IN LL (TOURNIQUET CUFF) ×3 IMPLANT
CUFF TOURNIQUET SINGLE 44IN (TOURNIQUET CUFF) IMPLANT
DRAPE EXTREMITY T 121X128X90 (DRAPE) ×3 IMPLANT
DRAPE IMP U-DRAPE 54X76 (DRAPES) ×3 IMPLANT
DRAPE U-SHAPE 47X51 STRL (DRAPES) ×3 IMPLANT
DRSG AQUACEL AG ADV 3.5X10 (GAUZE/BANDAGES/DRESSINGS) ×3 IMPLANT
DRSG MEPILEX BORDER 4X12 (GAUZE/BANDAGES/DRESSINGS) ×3 IMPLANT
DRSG PAD ABDOMINAL 8X10 ST (GAUZE/BANDAGES/DRESSINGS) ×3 IMPLANT
DURAPREP 26ML APPLICATOR (WOUND CARE) ×6 IMPLANT
ELECT REM PT RETURN 9FT ADLT (ELECTROSURGICAL) ×3
ELECTRODE REM PT RTRN 9FT ADLT (ELECTROSURGICAL) ×1 IMPLANT
EVACUATOR 1/8 PVC DRAIN (DRAIN) IMPLANT
FACESHIELD WRAPAROUND (MASK) ×9 IMPLANT
GAUZE SPONGE 4X4 12PLY STRL (GAUZE/BANDAGES/DRESSINGS) ×3 IMPLANT
GLOVE BIOGEL PI IND STRL 6.5 (GLOVE) ×1 IMPLANT
GLOVE BIOGEL PI IND STRL 8 (GLOVE) IMPLANT
GLOVE BIOGEL PI INDICATOR 6.5 (GLOVE) ×2
GLOVE BIOGEL PI INDICATOR 8 (GLOVE)
GLOVE ECLIPSE 7.5 STRL STRAW (GLOVE) IMPLANT
GLOVE SURG SS PI 6.5 STRL IVOR (GLOVE) ×3 IMPLANT
GOWN STRL REUS W/ TWL LRG LVL3 (GOWN DISPOSABLE) ×2 IMPLANT
GOWN STRL REUS W/ TWL XL LVL3 (GOWN DISPOSABLE) ×2 IMPLANT
GOWN STRL REUS W/TWL LRG LVL3 (GOWN DISPOSABLE) ×4
GOWN STRL REUS W/TWL XL LVL3 (GOWN DISPOSABLE) ×4
HANDPIECE INTERPULSE COAX TIP (DISPOSABLE) ×2
HOOD PEEL AWAY FACE SHEILD DIS (HOOD) ×9 IMPLANT
IMMOBILIZER KNEE 20 (SOFTGOODS) IMPLANT
IMMOBILIZER KNEE 22 UNIV (SOFTGOODS) ×3 IMPLANT
KIT BASIN OR (CUSTOM PROCEDURE TRAY) ×3 IMPLANT
KIT ROOM TURNOVER OR (KITS) ×3 IMPLANT
MANIFOLD NEPTUNE II (INSTRUMENTS) ×3 IMPLANT
NEEDLE SPNL 22GX3.5 QUINCKE BK (NEEDLE) ×3 IMPLANT
NS IRRIG 1000ML POUR BTL (IV SOLUTION) ×3 IMPLANT
PACK TOTAL JOINT (CUSTOM PROCEDURE TRAY) ×3 IMPLANT
PACK UNIVERSAL I (CUSTOM PROCEDURE TRAY) ×3 IMPLANT
PAD ARMBOARD 7.5X6 YLW CONV (MISCELLANEOUS) ×6 IMPLANT
PAD CAST 4YDX4 CTTN HI CHSV (CAST SUPPLIES) ×1 IMPLANT
PADDING CAST COTTON 4X4 STRL (CAST SUPPLIES) ×2
PIN STEINMAN FIXATION KNEE (PIN) ×3 IMPLANT
SET HNDPC FAN SPRY TIP SCT (DISPOSABLE) ×1 IMPLANT
STAPLER VISISTAT 35W (STAPLE) IMPLANT
STRIP CLOSURE SKIN 1/2X4 (GAUZE/BANDAGES/DRESSINGS) ×4 IMPLANT
SUCTION FRAZIER HANDLE 10FR (MISCELLANEOUS) ×2
SUCTION TUBE FRAZIER 10FR DISP (MISCELLANEOUS) ×1 IMPLANT
SUT MNCRL AB 3-0 PS2 18 (SUTURE) IMPLANT
SUT VIC AB 0 CTB1 27 (SUTURE) ×6 IMPLANT
SUT VIC AB 1 CT1 27 (SUTURE) ×4
SUT VIC AB 1 CT1 27XBRD ANBCTR (SUTURE) ×2 IMPLANT
SUT VIC AB 2-0 CTB1 (SUTURE) ×6 IMPLANT
SYR 50ML LL SCALE MARK (SYRINGE) ×3 IMPLANT
TOWEL OR 17X24 6PK STRL BLUE (TOWEL DISPOSABLE) ×3 IMPLANT
TOWEL OR 17X26 10 PK STRL BLUE (TOWEL DISPOSABLE) ×3 IMPLANT
TRAY FOLEY CATH 16FRSI W/METER (SET/KITS/TRAYS/PACK) IMPLANT
WRAP KNEE MAXI GEL POST OP (GAUZE/BANDAGES/DRESSINGS) ×3 IMPLANT

## 2016-07-05 NOTE — Anesthesia Postprocedure Evaluation (Signed)
Anesthesia Post Note  Patient: BARTO MURTAGH  Procedure(s) Performed: Procedure(s) (LRB): RIGHT TOTAL KNEE ARTHROPLASTY (Right)  Patient location during evaluation: PACU Anesthesia Type: Spinal Level of consciousness: awake and oriented Pain management: pain level controlled Vital Signs Assessment: post-procedure vital signs reviewed and stable Respiratory status: spontaneous breathing and respiratory function stable Cardiovascular status: blood pressure returned to baseline and stable Postop Assessment: spinal receding Anesthetic complications: no    Last Vitals:  Vitals:   07/05/16 1450 07/05/16 1500  BP: 98/79   Pulse: 63 61  Resp: 19 18  Temp: 36.4 C     Last Pain:  Vitals:   07/05/16 1028  TempSrc:   PainSc: 8             L Sensory Level: T12-Inguinal (groin) region (07/05/16 1450) R Sensory Level: T12-Inguinal (groin) region (07/05/16 1450)  Etoy Mcdonnell EDWARD

## 2016-07-05 NOTE — Anesthesia Procedure Notes (Signed)
Spinal  Patient location during procedure: OR Start time: 07/05/2016 12:44 PM End time: 07/05/2016 12:47 PM Staffing Anesthesiologist: Kate Sable Performed: anesthesiologist  Preanesthetic Checklist Completed: patient identified, site marked, surgical consent, pre-op evaluation, timeout performed, IV checked, risks and benefits discussed and monitors and equipment checked Spinal Block Patient position: right lateral decubitus Prep: Betadine Patient monitoring: heart rate, cardiac monitor, continuous pulse ox and blood pressure Approach: left paramedian Location: L3-4 Injection technique: single-shot Needle Needle type: Quincke  Needle gauge: 22 G Needle length: 9 cm Needle insertion depth: 5 cm Assessment Sensory level: T10 Additional Notes Pt accepts procedure w/ risks. 22ga w/o difficulty. CSF clear free flow w/o blood. 12mg  Marcaine w/ epi w/o difficulty. GES

## 2016-07-05 NOTE — Transfer of Care (Signed)
Immediate Anesthesia Transfer of Care Note  Patient: Stanley Kelly  Procedure(s) Performed: Procedure(s): RIGHT TOTAL KNEE ARTHROPLASTY (Right)  Patient Location: PACU  Anesthesia Type:Spinal  Level of Consciousness: awake, alert  and oriented  Airway & Oxygen Therapy: Patient Spontanous Breathing and Patient connected to nasal cannula oxygen  Post-op Assessment: Report given to RN and Post -op Vital signs reviewed and stable  Post vital signs: Reviewed and stable  Last Vitals:  Vitals:   07/05/16 1011  BP: 126/85  Pulse: 63  Resp: 18  Temp: 36.8 C    Last Pain:  Vitals:   07/05/16 1028  TempSrc:   PainSc: 8          Complications: No apparent anesthesia complications

## 2016-07-05 NOTE — Brief Op Note (Signed)
07/05/2016  2:28 PM  PATIENT:  Stanley Kelly  65 y.o. male  PRE-OPERATIVE DIAGNOSIS:  Osteoarthritis right knee  POST-OPERATIVE DIAGNOSIS:  Osteoarthritis right knee  PROCEDURE:  Procedure(s): RIGHT TOTAL KNEE ARTHROPLASTY (Right)  SURGEON:  Surgeon(s) and Role:    * Dorna Leitz, MD - Primary    * Frederik Pear, MD - Assisting  PHYSICIAN ASSISTANT:   ASSISTANTS: rowan md   ANESTHESIA:   spinal  EBL:  Total I/O In: 1000 [I.V.:1000] Out: 250 [Blood:250]  BLOOD ADMINISTERED:none  DRAINS: none   LOCAL MEDICATIONS USED:  MARCAINE    and OTHER experel  SPECIMEN:  No Specimen  DISPOSITION OF SPECIMEN:  N/A  COUNTS:  YES  TOURNIQUET:   Total Tourniquet Time Documented: Thigh (Right) - 57 minutes Total: Thigh (Right) - 57 minutes   DICTATION: .Other Dictation: Dictation Number none given  PLAN OF CARE: Admit to inpatient   PATIENT DISPOSITION:  PACU - hemodynamically stable.   Delay start of Pharmacological VTE agent (>24hrs) due to surgical blood loss or risk of bleeding: no

## 2016-07-05 NOTE — Anesthesia Procedure Notes (Signed)
Spinal

## 2016-07-05 NOTE — Anesthesia Preprocedure Evaluation (Addendum)
Anesthesia Evaluation  Patient identified by MRN, date of birth, ID band Patient awake    Reviewed: Allergy & Precautions, NPO status , Patient's Chart, lab work & pertinent test results  Airway Mallampati: I  TM Distance: >3 FB     Dental   Pulmonary    Pulmonary exam normal        Cardiovascular Normal cardiovascular exam     Neuro/Psych    GI/Hepatic   Endo/Other    Renal/GU      Musculoskeletal  (+) Arthritis ,   Abdominal   Peds  Hematology   Anesthesia Other Findings   Reproductive/Obstetrics                             Anesthesia Physical Anesthesia Plan  ASA: I  Anesthesia Plan: Spinal   Post-op Pain Management:    Induction: Intravenous  Airway Management Planned: Mask  Additional Equipment:   Intra-op Plan:   Post-operative Plan:   Informed Consent: I have reviewed the patients History and Physical, chart, labs and discussed the procedure including the risks, benefits and alternatives for the proposed anesthesia with the patient or authorized representative who has indicated his/her understanding and acceptance.     Plan Discussed with: CRNA, Anesthesiologist and Surgeon  Anesthesia Plan Comments:         Anesthesia Quick Evaluation

## 2016-07-05 NOTE — H&P (Signed)
TOTAL KNEE ADMISSION H&P  Patient is being admitted for right total knee arthroplasty.  Subjective:  Chief Complaint:right knee pain.  HPI: Stanley Kelly, 65 y.o. male, has a history of pain and functional disability in the right knee due to arthritis and has failed non-surgical conservative treatments for greater than 12 weeks to includeNSAID's and/or analgesics, corticosteriod injections, viscosupplementation injections, flexibility and strengthening excercises, weight reduction as appropriate and activity modification.  Onset of symptoms was gradual, starting 7 years ago with gradually worsening course since that time. The patient noted no past surgery on the right knee(s).  Patient currently rates pain in the right knee(s) at 10 out of 10 with activity. Patient has night pain, worsening of pain with activity and weight bearing, pain that interferes with activities of daily living, pain with passive range of motion, crepitus and joint swelling.  Patient has evidence of subchondral cysts, subchondral sclerosis, periarticular osteophytes, joint subluxation and joint space narrowing by imaging studies. This patient has had failure of all reasonable conservative care. There is no active infection.  Patient Active Problem List   Diagnosis Date Noted  . Primary osteoarthritis of left hip 08/18/2015  . Primary osteoarthritis of right knee 08/18/2015  . Primary osteoarthritis of left knee 08/18/2015  . History of osteoarthritis 10/07/2014  . Overweight(278.02) 05/16/2012   Past Medical History:  Diagnosis Date  . Arthritis   . Hx of knee surgery     Past Surgical History:  Procedure Laterality Date  . INJECTION KNEE Bilateral 08/18/2015   Procedure: BILATERAL TOTAL KNEE BILATERAL CORTISONE INJECTION;  Surgeon: Dorna Leitz, MD;  Location: [facility];  Service: Orthopedics;  Laterality: Bilateral;  . JOINT REPLACEMENT    . KNEE SURGERY    . TOTAL HIP ARTHROPLASTY Left 08/18/2015   Procedure: TOTAL HIP  ARTHROPLASTY ANTERIOR APPROACH;  Surgeon: Dorna Leitz, MD;  Location: [facility];  Service: Orthopedics;  Laterality: Left;  Marland Kitchen VASECTOMY  1985    No prescriptions prior to admission.   Allergies  Allergen Reactions  . No Known Allergies     Social History  Substance Use Topics  . Smoking status: Never Smoker  . Smokeless tobacco: Not on file  . Alcohol use No    Family History  Problem Relation Age of Onset  . Adopted: Yes     ROS ROS: I have reviewed the patient's review of systems thoroughly and there are no positive responses as relates to the HPI. Objective:  Physical Exam  Vital signs in last 24 hours:   Well-developed well-nourished patient in no acute distress. Alert and oriented x3 HEENT:within normal limits Cardiac: Regular rate and rhythm Pulmonary: Lungs clear to auscultation Abdomen: Soft and nontender.  Normal active bowel sounds  Musculoskeletal: right knee: Painful range of motion.  Limited range of motion.  1+ effusion.  No instability.  Obvious significant varus malalignment.  Range of motion is 5-95. Labs: Recent Results (from the past 2160 hour(s))  CBC with Differential/Platelet     Status: Abnormal   Collection Time: 05/17/16 10:05 AM  Result Value Ref Range   WBC 3.4 (L) 3.8 - 10.8 K/uL   RBC 4.95 4.20 - 5.80 MIL/uL   Hemoglobin 15.4 13.2 - 17.1 g/dL   HCT 43.9 38.5 - 50.0 %   MCV 88.7 80.0 - 100.0 fL   MCH 31.1 27.0 - 33.0 pg   MCHC 35.1 32.0 - 36.0 g/dL   RDW 13.2 11.0 - 15.0 %   Platelets 268 140 - 400 K/uL  MPV 9.6 7.5 - 12.5 fL   Neutro Abs 1,938 1,500 - 7,800 cells/uL   Lymphs Abs 1,020 850 - 3,900 cells/uL   Monocytes Absolute 306 200 - 950 cells/uL   Eosinophils Absolute 102 15 - 500 cells/uL   Basophils Absolute 34 0 - 200 cells/uL   Neutrophils Relative % 57 %   Lymphocytes Relative 30 %   Monocytes Relative 9 %   Eosinophils Relative 3 %   Basophils Relative 1 %   Smear Review Criteria for review not met     Comment: **  Please note change in unit of measure and reference range(s). **  COMPLETE METABOLIC PANEL WITH GFR     Status: None   Collection Time: 05/17/16 10:05 AM  Result Value Ref Range   Sodium 138 135 - 146 mmol/L   Potassium 4.2 3.5 - 5.3 mmol/L   Chloride 103 98 - 110 mmol/L   CO2 25 20 - 31 mmol/L   Glucose, Bld 99 65 - 99 mg/dL   BUN 25 7 - 25 mg/dL   Creat 0.88 0.70 - 1.25 mg/dL    Comment:   For patients > or = 63 years of age: The upper reference limit for Creatinine is approximately 13% higher for people identified as African-American.      Total Bilirubin 0.5 0.2 - 1.2 mg/dL   Alkaline Phosphatase 65 40 - 115 U/L   AST 32 10 - 35 U/L   ALT 42 9 - 46 U/L   Total Protein 7.3 6.1 - 8.1 g/dL   Albumin 5.0 3.6 - 5.1 g/dL   Calcium 10.0 8.6 - 10.3 mg/dL   GFR, Est African American >89 >=60 mL/min   GFR, Est Non African American >89 >=60 mL/min  Lipid panel     Status: None   Collection Time: 05/17/16 10:05 AM  Result Value Ref Range   Cholesterol 192 125 - 200 mg/dL   Triglycerides 127 <150 mg/dL   HDL 46 >=40 mg/dL   Total CHOL/HDL Ratio 4.2 <=5.0 Ratio   VLDL 25 <30 mg/dL   LDL Cholesterol 121 <130 mg/dL    Comment:   Total Cholesterol/HDL Ratio:CHD Risk                        Coronary Heart Disease Risk Table                                        Men       Women          1/2 Average Risk              3.4        3.3              Average Risk              5.0        4.4           2X Average Risk              9.6        7.1           3X Average Risk             23.4       11.0 Use the calculated Patient Ratio above and the CHD Risk table  to  determine the patient's CHD Risk.   IFOBT POC (occult bld, rslt in office)     Status: None   Collection Time: 05/21/16  6:30 PM  Result Value Ref Range   IFOBT Negative     Comment: pt did three samples all three were neg  Urinalysis, Routine w reflex microscopic (not at [facility])     Status: None   Collection Time: 06/25/16  8:44  AM  Result Value Ref Range   Color, Urine YELLOW YELLOW   APPearance CLEAR CLEAR   Specific Gravity, Urine 1.018 1.005 - 1.030   pH 5.5 5.0 - 8.0   Glucose, UA NEGATIVE NEGATIVE mg/dL   Hgb urine dipstick NEGATIVE NEGATIVE   Bilirubin Urine NEGATIVE NEGATIVE   Ketones, ur NEGATIVE NEGATIVE mg/dL   Protein, ur NEGATIVE NEGATIVE mg/dL   Nitrite NEGATIVE NEGATIVE   Leukocytes, UA NEGATIVE NEGATIVE    Comment: MICROSCOPIC NOT DONE ON URINES WITH NEGATIVE PROTEIN, BLOOD, LEUKOCYTES, NITRITE, OR GLUCOSE <1000 mg/dL.  Surgical pcr screen     Status: None   Collection Time: 06/25/16  8:44 AM  Result Value Ref Range   MRSA, PCR NEGATIVE NEGATIVE   Staphylococcus aureus NEGATIVE NEGATIVE    Comment:        The Xpert SA Assay (FDA approved for NASAL specimens in patients over 32 years of age), is one component of a comprehensive surveillance program.  Test performance has been validated by [facility] for patients greater than or equal to 30 year old. It is not intended to diagnose infection nor to guide or monitor treatment.   APTT     Status: None   Collection Time: 06/25/16  8:45 AM  Result Value Ref Range   aPTT 28 24 - 37 seconds  CBC WITH DIFFERENTIAL     Status: Abnormal   Collection Time: 06/25/16  8:45 AM  Result Value Ref Range   WBC 3.0 (L) 4.0 - 10.5 K/uL   RBC 4.63 4.22 - 5.81 MIL/uL   Hemoglobin 14.2 13.0 - 17.0 g/dL   HCT 41.4 39.0 - 52.0 %   MCV 89.4 78.0 - 100.0 fL   MCH 30.7 26.0 - 34.0 pg   MCHC 34.3 30.0 - 36.0 g/dL   RDW 12.5 11.5 - 15.5 %   Platelets 262 150 - 400 K/uL   Neutrophils Relative % 41 %   Neutro Abs 1.3 (L) 1.7 - 7.7 K/uL   Lymphocytes Relative 45 %   Lymphs Abs 1.4 0.7 - 4.0 K/uL   Monocytes Relative 10 %   Monocytes Absolute 0.3 0.1 - 1.0 K/uL   Eosinophils Relative 3 %   Eosinophils Absolute 0.1 0.0 - 0.7 K/uL   Basophils Relative 1 %   Basophils Absolute 0.0 0.0 - 0.1 K/uL  Comprehensive metabolic panel     Status: Abnormal    Collection Time: 06/25/16  8:45 AM  Result Value Ref Range   Sodium 138 135 - 145 mmol/L   Potassium 3.9 3.5 - 5.1 mmol/L   Chloride 107 101 - 111 mmol/L   CO2 25 22 - 32 mmol/L   Glucose, Bld 110 (H) 65 - 99 mg/dL   BUN 13 6 - 20 mg/dL   Creatinine, Ser 0.97 0.61 - 1.24 mg/dL   Calcium 9.7 8.9 - 10.3 mg/dL   Total Protein 6.5 6.5 - 8.1 g/dL   Albumin 4.1 3.5 - 5.0 g/dL   AST 24 15 - 41 U/L   ALT 25 17 - 63 U/L  Alkaline Phosphatase 65 38 - 126 U/L   Total Bilirubin 0.5 0.3 - 1.2 mg/dL   GFR calc non Af Amer >60 >60 mL/min   GFR calc Af Amer >60 >60 mL/min    Comment: (NOTE) The eGFR has been calculated using the CKD EPI equation. This calculation has not been validated in all clinical situations. eGFR's persistently <60 mL/min signify possible Chronic Kidney Disease.    Anion gap 6 5 - 15  Protime-INR     Status: None   Collection Time: 06/25/16  8:45 AM  Result Value Ref Range   Prothrombin Time 13.9 11.6 - 15.2 seconds   INR 1.05 0.00 - 1.49  Type and screen Order type and screen if day of surgery is less than 15 days from draw of preadmission visit or order morning of surgery if day of surgery is greater than 6 days from preadmission visit.     Status: None   Collection Time: 06/25/16  9:00 AM  Result Value Ref Range   ABO/RH(D) AB POS    Antibody Screen NEG    Sample Expiration 07/09/2016    Extend sample reason NO TRANSFUSIONS OR PREGNANCY IN THE PAST 3 MONTHS     Estimated body mass index is 31.69 kg/m as calculated from the following:   Height as of 06/25/16: '5\' 9"'  (1.753 m).   Weight as of 06/25/16: 97.3 kg (214 lb 9.6 oz).   Imaging Review Plain radiographs demonstrate severe degenerative joint disease of the bilaterally knee(s). The overall alignment issignificant varus. The bone quality appears to be fair for age and reported activity level.  Assessment/Plan:  End stage arthritis, right knee   The patient history, physical examination, clinical judgment  of the provider and imaging studies are consistent with end stage degenerative joint disease of the right knee(s) and total knee arthroplasty is deemed medically necessary. The treatment options including medical management, injection therapy arthroscopy and arthroplasty were discussed at length. The risks and benefits of total knee arthroplasty were presented and reviewed. The risks due to aseptic loosening, infection, stiffness, patella tracking problems, thromboembolic complications and other imponderables were discussed. The patient acknowledged the explanation, agreed to proceed with the plan and consent was signed. Patient is being admitted for inpatient treatment for surgery, pain control, PT, OT, prophylactic antibiotics, VTE prophylaxis, progressive ambulation and ADL's and discharge planning. The patient is planning to be discharged home with home health services

## 2016-07-05 NOTE — Progress Notes (Signed)
Orthopedic Tech Progress Note Patient Details:  Stanley Kelly [birth year] [id number] Ortho visit put on cpm at [facility] Patient ID: Stanley Kelly, male   DOB: [birth year], 65 y.o.   MRN: [id number]   Braulio Bosch 07/05/2016, 6:57 PM

## 2016-07-05 NOTE — Progress Notes (Signed)
Orthopedic Tech Progress Note Patient Details:  Stanley Kelly October 03, 1951 [id number]  CPM Right Knee CPM Right Knee: On Right Knee Flexion (Degrees): 60 Right Knee Extension (Degrees): 0 Additional Comments: trapeze bar patient helper   [name] 07/05/2016, 3:11 PM viewed order4 from doctor's order list

## 2016-07-05 NOTE — Anesthesia Procedure Notes (Signed)
Date/Time: 07/05/2016 12:50 PM Performed by: Eligha Bridegroom Patient Re-evaluated:Patient Re-evaluated prior to inductionOxygen Delivery Method: Nasal cannula Intubation Type: IV induction

## 2016-07-05 NOTE — Op Note (Signed)
NAME:  Stanley Kelly, Stanley Kelly                   ACCOUNT NO.:  [id number]  MEDICAL RECORD NO.:  [id number]  LOCATION:  [facility]                         FACILITY:  [facility]  PHYSICIAN:  Alta Corning, M.D.   DATE OF BIRTH:  [birth year]  DATE OF PROCEDURE:  07/05/2016 DATE OF DISCHARGE:                              OPERATIVE REPORT   PREOPERATIVE DIAGNOSIS:  End-stage degenerative joint disease, right knee with severe bone-on-bone change.  POSTOPERATIVE DIAGNOSIS:  End-stage degenerative joint disease, right knee with severe bone-on-bone change.  PROCEDURE:  Right total knee replacement with a Sigma System size 5 femur, size 5 tibia, 17.5-mm bridging bearing, and a 41-mm all polyethylene patella.  SURGEON:  Alta Corning, M.D.  Terrence DupontMayer Camel, M.D.  ANESTHESIA:  Spinal.  BRIEF HISTORY:  Mr. Vaziri is a 65 year old male with a long history of having severe bilateral knee arthritis and hip arthritis.  We treated with a hip replacement a year ago and he has done well.  His knees have continued to bother him off and on, and after failure of all conservative care for many years with severe bone-on-bone change and failure of injection therapy, physical therapy, weight loss, and activity modification, he was taken to the operating room for right total knee replacement.  DESCRIPTION OF PROCEDURE:  The patient was taken to the operating room. After adequate anesthesia was obtained with general anesthetic, the patient was placed supine on the operating table and right leg was prepped and draped in sterile fashion.  Following this, the leg was exsanguinated, blood pressure tourniquet inflated to 300 mmHg. Following this, a midline incision was made in the subcutaneous tissues and carried down to the level of extensor mechanism where medial parapatellar arthrotomy was undertaken.  Once this was completed, attention was turned toward removal of the anterior posterior cruciates, medial and lateral  meniscus, retropatellar fat pad, synovium at the anterior aspect of the femur.  Once this was completed, attention was turned toward the femur where an intramedullary pilot hole was drilled and a distal femoral cut was made with an 11 mm of distal bone and 4- degree valgus inclination.  Once this was completed, the attention was turned towards sizing the femur where it sized to a 5.  Anterior- posterior cuts were made, chamfers and box.  Attention then turned to the tibia, where the tibia was cut perpendicular to its long axis and the anatomic alignment was achieved and the tibia was drilled and keeled.  The trial tibia was put in place with the trial femur and a 17.5-mm bridging bearing was necessary because of the severe nature of his malalignment initially.  Once this was completed, attention was turned towards the patella which was cut down to the level of 13 mm and a 41 paddle was chosen.  Lugs were drilled and the patella was placed. Knee was put through a trial range of motion, excellent stability and range of motion were achieved at this point.  Attention was then turned towards removal of trial components.  The final components were cemented into place size 5 tibia, size 5 femur, 10-mm bridging bearing, and a 41-mm all poly patella was placed  and held with a clamp.  All excess bone cement was removed.  Once the cement was allowed to completely harden, attention was turned towards removal of the trial components.  Tourniquet was let down.  All bleeding was controlled with electrocautery.  A 60 mL of 20 mL Exparel, 20 mL Marcaine, and 20 mL saline were instilled throughout the knee, throughout the capsular folds, reflections for postoperative anesthesia. Once this was completed, the trial 17.5 poly was opened into place.  The knee was now put through a range of motion and excellent stability and range of motion were achieved.  Attention at this time was turned towards the closure  and the knee was again irrigated, suctioned dry, and then the closure was undertaken with Vicryl interrupted running sutures. Once this was done, the skin was closed with 0 and 2-0 Vicryl and 3-0 Monocryl subcuticular.  Benzoin and Steri-Strips were applied.  Sterile compressive dressing was applied as well as a waterproof dressing.  The patient was taken to the recovery room where he was noted to be in satisfactory condition.  Estimated blood loss for the procedure was minimal.     Alta Corning, M.D.     Corliss Skains  D:  07/05/2016  T:  07/05/2016  Job:  [id number]

## 2016-07-06 ENCOUNTER — Encounter (HOSPITAL_COMMUNITY): Payer: Self-pay | Admitting: Orthopedic Surgery

## 2016-07-06 LAB — BASIC METABOLIC PANEL
ANION GAP: 5 (ref 5–15)
BUN: 14 mg/dL (ref 6–20)
CO2: 28 mmol/L (ref 22–32)
Calcium: 8.9 mg/dL (ref 8.9–10.3)
Chloride: 105 mmol/L (ref 101–111)
Creatinine, Ser: 0.82 mg/dL (ref 0.61–1.24)
GFR calc Af Amer: >60 mL/min (ref >60)
GLUCOSE: 100 mg/dL — AB (ref 65–99)
POTASSIUM: 3.6 mmol/L (ref 3.5–5.1)
Sodium: 138 mmol/L (ref 135–145)

## 2016-07-06 LAB — CBC
HEMATOCRIT: 34.8 % — AB (ref 39.0–52.0)
Hemoglobin: 11.7 g/dL — ABNORMAL LOW (ref 13.0–17.0)
MCH: 30.5 pg (ref 26.0–34.0)
MCHC: 33.6 g/dL (ref 30.0–36.0)
MCV: 90.6 fL (ref 78.0–100.0)
Platelets: 208 10*3/uL (ref 150–400)
RBC: 3.84 MIL/uL — AB (ref 4.22–5.81)
RDW: 12.7 % (ref 11.5–15.5)
WBC: 5.5 10*3/uL (ref 4.0–10.5)

## 2016-07-06 MED ORDER — ASPIRIN 325 MG PO TBEC: 325.0000 mg | DELAYED_RELEASE_TABLET | Freq: Every day | ORAL | 0 refills | Status: DC

## 2016-07-06 MED ORDER — METHOCARBAMOL 500 MG PO TABS: 500.0000 mg | ORAL_TABLET | Freq: Four times a day (QID) | ORAL | 0 refills | Status: DC | PRN

## 2016-07-06 MED ORDER — OXYCODONE-ACETAMINOPHEN 5-325 MG PO TABS: 1.0000 | ORAL_TABLET | ORAL | 0 refills | Status: DC | PRN

## 2016-07-06 MED ORDER — ASPIRIN EC 325 MG PO TBEC: 325.0000 mg | DELAYED_RELEASE_TABLET | Freq: Two times a day (BID) | ORAL | 0 refills | Status: DC

## 2016-07-06 MED ORDER — PROMETHAZINE HCL 50 MG PO TABS: 50.0000 mg | ORAL_TABLET | Freq: Four times a day (QID) | ORAL | 0 refills | Status: DC | PRN

## 2016-07-06 NOTE — Care Management Note (Signed)
Case Management Note  Patient Details  Name: Stanley Kelly MRN: [id number] Date of Birth: May 12, 1951  Subjective/Objective:   65 yr old gentleman s/p right total knee arthroplasty.                 Action/Plan: Case manager spoke with patient concerning [place] and DME. Patient states he will not have home health, he will be going to outpatient therapy in [place], starting on Wednesday, 07/07/16. Patient has family support at discharge.   Expected Discharge Date:   07/06/16         Expected Discharge Plan:   Home/self care  In-House Referral:  NA  Discharge planning Services  CM Consult  Post Acute Care Choice:  Durable Medical Equipment Choice offered to:  Patient  DME Arranged:  Gilford Rile rolling, CPM DME Agency:  [employer]  HH Arranged:  NA HH Agency:  NA  Status of Service:  Completed, signed off  If discussed at [place] of Stay Meetings, dates discussed:    Additional Comments:  Ninfa Meeker, RN 07/06/2016, 2:08 PM

## 2016-07-06 NOTE — Plan of Care (Signed)
Problem: Safety: Goal: Ability to remain free from injury will improve Outcome: Progressing No safety issues noted  Problem: Pain Managment: Goal: General experience of comfort will improve Outcome: Progressing Pain improving this am  Problem: Physical Regulation: Goal: Will remain free from infection Outcome: Progressing No S/S of infection noted  Problem: Skin Integrity: Goal: Risk for impaired skin integrity will decrease Outcome: Progressing No skin issues noted  Problem: Tissue Perfusion: Goal: Risk factors for ineffective tissue perfusion will decrease Outcome: Progressing No S/S of DVT noted  Problem: Activity: Goal: Risk for activity intolerance will decrease Outcome: Progressing Remains in the bed  Problem: Bowel/Gastric: Goal: Will not experience complications related to bowel motility Outcome: Progressing No gastric issues noted

## 2016-07-06 NOTE — Evaluation (Signed)
Physical Therapy Evaluation Patient Details Name: Stanley Kelly MRN: [id number] DOB: 13-Feb-1951 Today's Date: 07/06/2016   History of Present Illness  65 y.o. male now s/p Rt TKA. PMH: Lt THA.   Clinical Impression  Pt is s/p TKA resulting in the deficits listed below (see PT Problem List). Pt able to ambulate 20 ft X2, limited by nausea and dizziness. Pt will benefit from skilled PT to increase their independence and safety with mobility to allow discharge to home with family support.      Follow Up Recommendations Home health PT;Supervision for mobility/OOB    Equipment Recommendations  None recommended by PT    Recommendations for Other Services       Precautions / Restrictions Precautions Precautions: Knee;Fall Precaution Booklet Issued: Yes (comment) Precaution Comments: HEP provided, reviewed knee extension precautions Required Braces or Orthoses: Knee Immobilizer - Right Knee Immobilizer - Right: Discontinue once straight leg raise with < 10 degree lag Restrictions Weight Bearing Restrictions: Yes RLE Weight Bearing: Weight bearing as tolerated      Mobility  Bed Mobility Overal bed mobility: Needs Assistance Bed Mobility: Supine to Sit     Supine to sit: Supervision     General bed mobility comments: HOB elevated approx. 20 degrees, using rail to assist.   Transfers Overall transfer level: Needs assistance Equipment used: Rolling walker (2 wheeled) Transfers: Sit to/from Stand Sit to Stand: Min guard         General transfer comment: cues for hand position with sit and stand  Ambulation/Gait Ambulation/Gait assistance: Min guard Ambulation Distance (Feet): 20 Feet (20 feet X2) Assistive device: Rolling walker (2 wheeled) Gait Pattern/deviations: Step-to pattern;Decreased stance time - right;Decreased weight shift to right Gait velocity: decreased   General Gait Details: Pt reports feeling nauseated and mildly dizzy during first attempt to ambulate.  Able to attempt again following seated rest. Nursing notified.   Stairs            Wheelchair Mobility    Modified Rankin (Stroke Patients Only)       Balance Overall balance assessment: Needs assistance Sitting-balance support: No upper extremity supported Sitting balance-[name]: Good     Standing balance support: Bilateral upper extremity supported Standing balance-[name]: Poor Standing balance comment: using rw                             Pertinent Vitals/Pain Pain Assessment: Faces Faces Pain Scale: Hurts little more Pain Location: Rt knee  Pain Descriptors / Indicators: Grimacing;Guarding Pain Intervention(s): Limited activity within patient's tolerance;Monitored during session (declined ice)    Home Living Family/patient expects to be discharged to:: Private residence Living Arrangements: Spouse/significant other Available Help at Discharge: Family;Available 24 hours/day Type of Home: House Home Access: Stairs to enter Entrance Stairs-Rails: None Entrance Stairs-Number of Steps: 1 Home Layout: One level Home Equipment: Environmental consultant - 2 wheels      Prior Function Level of Independence: Independent               Hand Dominance        Extremity/Trunk Assessment   Upper Extremity Assessment: Overall WFL for tasks assessed           Lower Extremity Assessment: RLE deficits/detail RLE Deficits / Details: able to perform SLR with lag       Communication   Communication: No difficulties  Cognition Arousal/Alertness: Awake/alert Behavior During Therapy: WFL for tasks assessed/performed Overall Cognitive Status: Within Functional Limits  for tasks assessed                      General Comments      Exercises        Assessment/Plan    PT Assessment Patient needs continued PT services  PT Diagnosis Difficulty walking   PT Problem List Decreased strength;Decreased range of motion;Decreased activity  tolerance;Decreased balance;Decreased mobility  PT Treatment Interventions DME instruction;Gait training;Stair training;Therapeutic activities;Functional mobility training;Therapeutic exercise;Patient/family education   PT Goals (Current goals can be found in the Care Plan section) Acute Rehab PT Goals Patient Stated Goal: get back home, have next surgery in 6 weeks.  PT Goal Formulation: With patient Time For Goal Achievement: 07/20/16 Potential to Achieve Goals: Good    Frequency 7X/week   Barriers to discharge        Co-evaluation               End of Session Equipment Utilized During Treatment: Gait belt Activity Tolerance: Other (comment) (nausea, dizziness) Patient left: in chair;with call bell/phone within reach;with family/visitor present;with nursing/sitter in room (in knee extension) Nurse Communication: Mobility status;Weight bearing status         Time: [id number] PT Time Calculation (min) (ACUTE ONLY): 30 min   Charges:   PT Evaluation $PT Eval Moderate Complexity: 1 Procedure PT Treatments $Gait Training: 8-22 mins   PT G Codes:        Cassell Clement, PT, CSCS Pager [phone] Office [phone]  07/06/2016, 12:01 PM

## 2016-07-06 NOTE — Progress Notes (Signed)
Physical Therapy Treatment Patient Details Name: Stanley Kelly MRN: [id number] DOB: Jan 20, 1951 Today's Date: 07/06/2016    History of Present Illness 65 y.o. male now s/p Rt TKA. PMH: Lt THA.     PT Comments    Pt reports feeling much better during second PT session. Pt able to increase his ambulation to 100 ft with rw and go up/down 1 step. Pt states that he is hoping that he will be able to go home later today. PT will continue to follow and progress mobility as tolerated.   Follow Up Recommendations  Home health PT;Supervision for mobility/OOB     Equipment Recommendations  None recommended by PT    Recommendations for Other Services       Precautions / Restrictions Precautions Precautions: Knee;Fall Required Braces or Orthoses: Knee Immobilizer - Right Knee Immobilizer - Right: Discontinue once straight leg raise with < 10 degree lag Restrictions Weight Bearing Restrictions: Yes RLE Weight Bearing: Weight bearing as tolerated    Mobility  Bed Mobility Overal bed mobility: Needs Assistance Bed Mobility: Supine to Sit;Sit to Supine     Supine to sit: Supervision Sit to supine: Min guard   General bed mobility comments: pt using hook technique to assist Rt LE back to bed.   Transfers Overall transfer level: Needs assistance Equipment used: Rolling walker (2 wheeled) Transfers: Sit to/from Stand Sit to Stand: Supervision         General transfer comment: reminder for hand position  Ambulation/Gait Ambulation/Gait assistance: Min guard;Supervision Ambulation Distance (Feet): 100 Feet Assistive device: Rolling walker (2 wheeled) Gait Pattern/deviations: Step-to pattern;Step-through pattern Gait velocity: decreased   General Gait Details: Working on even strides and step through pattern. Pt declined use of Knee immobilizer.    Stairs Stairs: Yes Stairs assistance: Min guard Stair Management: No rails;Backwards;With walker Number of Stairs: 1 General stair  comments: pt reports feeling confident with ability to perform at home.   Wheelchair Mobility    Modified Rankin (Stroke Patients Only)       Balance Overall balance assessment: Needs assistance Sitting-balance support: No upper extremity supported Sitting balance-[name]: Good     Standing balance support: Bilateral upper extremity supported Standing balance-[name]: Poor Standing balance comment: using rw for support                    Cognition Arousal/Alertness: Awake/alert Behavior During Therapy: WFL for tasks assessed/performed Overall Cognitive Status: Within Functional Limits for tasks assessed                      Exercises Total Joint Exercises Ankle Circles/Pumps: AROM;Both;10 reps Quad Sets: Strengthening;Right;10 reps Short Arc Quad: Strengthening;Right;10 reps (min assist) Heel Slides: AAROM;Right;10 reps Hip ABduction/ADduction: Strengthening;Right;10 reps Straight Leg Raises: Strengthening;Right;5 reps (min assist) Knee Flexion: AROM;Right;5 reps Goniometric ROM: 42 degrees flexion    General Comments        Pertinent Vitals/Pain Pain Assessment: Faces Faces Pain Scale: Hurts a little bit Pain Location: Rt knee Pain Descriptors / Indicators: Aching Pain Intervention(s): Limited activity within patient's tolerance;Monitored during session;Ice applied    Home Living       Prior Function          PT Goals (current goals can now be found in the care plan section) Acute Rehab PT Goals Patient Stated Goal: go home PT Goal Formulation: With patient Time For Goal Achievement: 07/20/16 Potential to Achieve Goals: Good Progress towards PT goals: Progressing toward goals  Frequency  7X/week    PT Plan Current plan remains appropriate    Co-evaluation             End of Session Equipment Utilized During Treatment: Gait belt Activity Tolerance: Patient tolerated treatment well Patient left: in bed;with call  bell/phone within reach (in bone foam)     Time: [id number] PT Time Calculation (min) (ACUTE ONLY): 30 min  Charges:  $Gait Training: 8-22 mins $Therapeutic Exercise: 8-22 mins                    G Codes:      Cassell Clement, PT, CSCS Pager [phone] Office [phone]  07/06/2016, 4:04 PM

## 2016-07-06 NOTE — Evaluation (Signed)
Occupational Therapy Evaluation and Discharge Patient Details Name: Stanley Kelly MRN: [id number] DOB: 05-Jan-1951 Today's Date: 07/06/2016    History of Present Illness 65 y.o. male now s/p Rt TKA. PMH: Lt THA.    Clinical Impression   Pt attempting to push IV pole while walking to bathroom with walker upon OT's arrival. Pt presents with mild post operative pain and impaired balance. Reports feeling much better than this morning. Pt toileted and stood at sink to groom with supervision. All education completed with pt verbalizing understanding. No further OT needs  Follow Up Recommendations  No OT follow up    Equipment Recommendations  None recommended by OT    Recommendations for Other Services       Precautions / Restrictions Precautions Precautions: Knee;Fall Precaution Booklet Issued: Yes (comment) Precaution Comments: HEP provided, reviewed knee extension precautions Required Braces or Orthoses: Knee Immobilizer - Right Knee Immobilizer - Right: Discontinue once straight leg raise with < 10 degree lag Restrictions Weight Bearing Restrictions: Yes RLE Weight Bearing: Weight bearing as tolerated      Mobility Bed Mobility Overal bed mobility: Needs Assistance Bed Mobility: Sit to Supine      Sit to supine: Min guard   General bed mobility comments: pt used L LE to assist R back into bed  Transfers Overall transfer level: Needs assistance Equipment used: Rolling walker (2 wheeled) Transfers: Sit to/from Stand Sit to Stand: Supervision         General transfer comment: cues for safety and R LE placement with sit to stand    Balance Overall balance assessment: Needs assistance Sitting-balance support: No upper extremity supported Sitting balance-[name]: Good     Standing balance support: Bilateral upper extremity supported Standing balance-[name]: Fair Standing balance comment: able to release walker to pull up pants and to perform standing  grooming                            ADL Overall ADL's : Needs assistance/impaired Eating/Feeding: Independent;Sitting   Grooming: Wash/dry hands;Oral care;Standing;Set up;Supervision/safety   Upper Body Bathing: Set up;Sitting   Lower Body Bathing: Minimal assistance;Sit to/from stand   Upper Body Dressing : Set up;Sitting   Lower Body Dressing: Minimal assistance;Sit to/from stand   Toilet Transfer: Supervision/safety;RW;Comfort height toilet;Ambulation;Grab bars   Toileting- Clothing Manipulation and Hygiene: Supervision/safety;Sit to/from Nurse, children's Details (indicate cue type and reason): instructed in technique Functional mobility during ADLs: Supervision/safety;Rolling walker General ADL Comments: Instructed in compensatory strategies for LB dressing, recommended long handled bath sponge. Educated in safe footwear. Wife will assist as needed, does not want AE instruction.     Vision     Perception     Praxis      Pertinent Vitals/Pain Pain Assessment: Faces Faces Pain Scale: Hurts a little bit Pain Location: R knee Pain Descriptors / Indicators: Sore Pain Intervention(s): Monitored during session;Repositioned     Hand Dominance Right   Extremity/Trunk Assessment Upper Extremity Assessment Upper Extremity Assessment: Overall WFL for tasks assessed   Lower Extremity Assessment Lower Extremity Assessment: Defer to PT evaluation RLE Deficits / Details: able to perform SLR with lag       Communication Communication Communication: No difficulties   Cognition Arousal/Alertness: Awake/alert Behavior During Therapy: WFL for tasks assessed/performed Overall Cognitive Status: Within Functional Limits for tasks assessed  General Comments       Exercises       Shoulder Instructions      Home Living Family/patient expects to be discharged to:: Private residence Living Arrangements:  Spouse/significant other Available Help at Discharge: Family;Available 24 hours/day Type of Home: House Home Access: Stairs to enter [employer] of Steps: 1 Entrance Stairs-Rails: None Home Layout: One level     Bathroom Shower/Tub: Walk-in shower;Curtain   Bathroom Toilet: Handicapped height (with a riser)     Home Equipment: Walker - 2 wheels;Toilet riser;Grab bars - tub/shower;Hand held shower head;Shower seat          Prior Functioning/Environment Level of Independence: Independent             OT Diagnosis: Generalized weakness;Acute pain   OT Problem List:     OT Treatment/Interventions:      OT Goals(Current goals can be found in the care plan section) Acute Rehab OT Goals Patient Stated Goal: get back home, have next surgery in 6 weeks.   OT Frequency:     Barriers to D/C:            Co-evaluation              End of Session Equipment Utilized During Treatment: Gait belt;Rolling walker  Activity Tolerance: Patient tolerated treatment well Patient left: in bed;with call bell/phone within reach (PT with pt)   Time: 1410-1440 OT Time Calculation (min): 30 min Charges:  OT General Charges $OT Visit: 1 Procedure OT Evaluation $OT Eval Low Complexity: 1 Procedure OT Treatments $Self Care/Home Management : 8-22 mins G-Codes:    Stanley Kelly 07/06/2016, 2:48 PM  [phone]

## 2016-07-06 NOTE — Discharge Instructions (Signed)
Knee Flexion  Heel Slide    Bend left knee and pull heel toward buttocks. Repeat _10 per hour while awake and not in cpm_ times. Do __multiple__ sessions per day.  http://gt2.exer.us/300   Copyright  VHI. All rights reserved.    Copyright  VHI. All rights reserved.  Total Knee Replacement Total knee replacement is a procedure to replace your knee joint with an artificial knee joint (prosthetic knee joint). The purpose of this surgery is to reduce pain and improve your knee function. LET [facility] CARE PROVIDER KNOW ABOUT:   Any allergies you have.  All medicines you are taking, including vitamins, herbs, eye drops, creams, and over-the-counter medicines.  Previous problems you or members of your family have had with the use of anesthetics.  Any blood disorders you have.  Previous surgeries you have had.  Medical conditions you have. RISKS AND COMPLICATIONS  Generally, total knee replacement is a safe procedure. However, problems can occur, including:  Loss of range of motion of the knee or instability of the knee.  Loosening of the prosthesis.  Infection.  Persistent pain. BEFORE THE PROCEDURE   Plan to have someone take you home after the procedure.  Do not eat or drink anything after midnight on the night before the procedure or as directed by your health care provider.  Ask your health care provider about:  Changing or stopping your regular medicines. This is especially important if you are taking diabetes medicines or blood thinners.  Taking medicines such as aspirin and ibuprofen. These medicines can thin your blood. Do not take these medicines before your procedure if your health care provider asks you not to.  Ask your health care provider about how your surgical site will be marked or identified.  You may be given antibiotic medicines to help prevent infection. PROCEDURE   To reduce your risk of infection:  Your health care team will wash or  sanitize their hands.  Your skin will be washed with soap.  An IV tube will be inserted into one of your veins. You will be given one or more of the following:  A medicine that makes you drowsy (sedative).  A medicine that makes you fall asleep (general anesthetic).  A medicine injected into your spine that numbs your body below the waist (spinal anesthetic).  A medicine to block feeling in your leg (nerve block) to help ease pain after surgery.  An incision will be made in your knee. Your surgeon will take out any damaged cartilage and bone by sawing off the damaged surfaces.  The surgeon will then put a new metal liner over the sawed-off portion of your thigh bone (femur) and a plastic liner over the sawed-off portion of one of the bones of your lower leg (tibia). This is to restore alignment and function to your knee. A plastic piece is often used to restore the surface of your knee cap. AFTER THE PROCEDURE   You will stay in a recovery area until your medicines wear off.  You may have drainage tubes to drain excess fluid from your knee. These tubes attach to a device that removes these fluids.  Once you are awake, stable, and taking fluids well, you will be taken to your hospital room.  You may be directed to take actions to help prevent blood clots. These may include:  Walking shortly after surgery, with someone assisting you. Moving around after surgery helps to improve blood flow.  Wearing compression stockings or using different  types of devices.  You will receive physical therapy as prescribed by your health care provider.   This information is not intended to replace advice given to you by your health care provider. Make sure you discuss any questions you have with your health care provider.   Document Released: 02/28/2001 Document Revised: 08/13/2015 Document Reviewed: 01/02/2012 Elsevier Interactive Patient Education [employer].

## 2016-07-06 NOTE — Progress Notes (Signed)
Subjective: 1 Day Post-Op Procedure(s) (LRB): RIGHT TOTAL KNEE ARTHROPLASTY (Right) Patient reports pain as moderate.    Objective: Vital signs in last 24 hours: Temp:  [97.5 F (36.4 C)-98.5 F (36.9 C)] 98.1 F (36.7 C) (08/01 0624) Pulse Rate:  [53-84] 63 (08/01 0624) Resp:  [11-20] 18 (08/01 0624) BP: (95-133)/(58-85) 104/58 (08/01 0624) SpO2:  [98 %-100 %] 99 % (08/01 0624) Weight:  [97.3 kg (214 lb 9.6 oz)] 97.3 kg (214 lb 9.6 oz) (07/31 1011)  Intake/Output from previous day: 07/31 0701 - 08/01 0700 In: 2010 [P.O.:210; I.V.:1800] Out: 2050 [Urine:1800; Blood:250] Intake/Output this shift: No intake/output data recorded.   Recent Labs  07/06/16 0555  HGB 11.7*    Recent Labs  07/06/16 0555  WBC 5.5  RBC 3.84*  HCT 34.8*  PLT 208   No results for input(s): NA, K, CL, CO2, BUN, CREATININE, GLUCOSE, CALCIUM in the last 72 hours. No results for input(s): LABPT, INR in the last 72 hours.  Neurologically intact ABD soft Neurovascular intact Sensation intact distally Intact pulses distally Dorsiflexion/Plantar flexion intact No cellulitis present Compartment soft  Assessment/Plan: 1 Day Post-Op Procedure(s) (LRB): RIGHT TOTAL KNEE ARTHROPLASTY (Right) Advance diet Up with therapy  Consider d/c today if pain and mobility appropriate iof not then d/c tomorrow.  Mirca Yale L 07/06/2016, 8:03 AM

## 2016-07-07 ENCOUNTER — Ambulatory Visit (HOSPITAL_COMMUNITY): Payer: Managed Care, Other (non HMO) | Attending: Orthopedic Surgery | Admitting: Physical Therapy

## 2016-07-07 DIAGNOSIS — R2681 Unsteadiness on feet: Secondary | ICD-10-CM

## 2016-07-07 DIAGNOSIS — R262 Difficulty in walking, not elsewhere classified: Secondary | ICD-10-CM | POA: Insufficient documentation

## 2016-07-07 DIAGNOSIS — M25561 Pain in right knee: Secondary | ICD-10-CM | POA: Insufficient documentation

## 2016-07-07 DIAGNOSIS — M25661 Stiffness of right knee, not elsewhere classified: Secondary | ICD-10-CM | POA: Diagnosis present

## 2016-07-07 NOTE — Therapy (Signed)
[place], [place], [address] Phone: [phone]   Fax:  [phone]  Physical Therapy Evaluation  Patient Details  Name: Stanley Kelly MRN: [id number] Date of Birth: August 13, 1951 Referring Provider: Dorna Leitz  Encounter Date: 07/07/2016      PT End of Session - 07/07/16 0944    Visit Number 1   Number of Visits 24   Date for PT Re-Evaluation 08/06/16   Authorization Type [employer]   Authorization - Visit Number 1   Authorization - Number of Visits 10   PT Start Time 0904   PT Stop Time 0945   PT Time Calculation (min) 41 min   Activity Tolerance Patient tolerated treatment well   Behavior During Therapy [facility] for tasks assessed/performed      Past Medical History:  Diagnosis Date  . Arthritis   . Hx of knee surgery     Past Surgical History:  Procedure Laterality Date  . INJECTION KNEE Bilateral 08/18/2015   Procedure: BILATERAL TOTAL KNEE BILATERAL CORTISONE INJECTION;  Surgeon: Dorna Leitz, MD;  Location: [place];  Service: Orthopedics;  Laterality: Bilateral;  . JOINT REPLACEMENT    . KNEE SURGERY    . TOTAL HIP ARTHROPLASTY Left 08/18/2015   Procedure: TOTAL HIP ARTHROPLASTY ANTERIOR APPROACH;  Surgeon: Dorna Leitz, MD;  Location: [place];  Service: Orthopedics;  Laterality: Left;  . TOTAL KNEE ARTHROPLASTY Right 07/05/2016   Procedure: RIGHT TOTAL KNEE ARTHROPLASTY;  Surgeon: Dorna Leitz, MD;  Location: [place];  Service: Orthopedics;  Laterality: Right;  Marland Kitchen VASECTOMY  1985    There were no vitals filed for this visit.       Subjective Assessment - 07/07/16 [id number]    Subjective Stanley Kelly had a Rt TKR on 07/05/2016, discharged on 07/06/2016 and is now being referred to skilled therapy.  He is currently walking with a walker.  He normally does not use any assistive device.  Occasionally if he was on uneven terrain he would use a cane.    Pertinent History Lt THR;    How long can you sit comfortably? Has not tried to  sit for prolong time    How long can you stand comfortably? Has not tried   How long can you walk comfortably? to the car approximately 100 ft at a time with the walker    Currently in Pain? Yes   Pain Score 4   pain meds taken this AM   Pain Location Knee   Pain Orientation Right   Pain Descriptors / Indicators Sharp;Constant;Aching   Pain Type Surgical pain   Pain Onset In the past 7 days   Pain Frequency Constant   Aggravating Factors  motion   Pain Relieving Factors ice   Effect of Pain on Daily Activities increases            [facility] PT Assessment - 07/07/16 0001      Assessment   Medical Diagnosis RT TKR   Referring Provider Dorna Leitz   Onset Date/Surgical Date 07/05/16   Next MD Visit 07/20/2016   Prior Therapy acute     Precautions   Precautions None     Restrictions   Weight Bearing Restrictions No     Balance Screen   Has the patient fallen in the past 6 months No   Has the patient had a decrease in activity level because of a fear of falling?  Yes   Is the patient reluctant to leave their  home because of a fear of falling?  Yes     Prior Function   Level of Independence Independent   Vocation Full time employment   Vocation Requirements driver   Leisure walk, yard work, works on Public affairs consultant Status Within [employer] for tasks assessed     Observation/Other Assessments   Focus on Therapeutic Outcomes (FOTO)  35     ROM / Strength   AROM / PROM / Strength AROM;Strength     AROM   AROM Assessment Site Knee   Right/Left Knee Right   Right Knee Extension 9   Right Knee Flexion 60     Strength   Strength Assessment Site Hip;Knee;Ankle   Right/Left Hip Right   Right Hip Flexion 2-/5   Right Hip ABduction 3+/5   Right/Left Knee Right   Right Knee Extension 2+/5   Right/Left Ankle Right   Right Ankle Dorsiflexion 5/5                   [facility] Adult PT Treatment/Exercise - 07/07/16 0001       Exercises   Exercises Knee/Hip     Knee/Hip Exercises: Standing   Heel Raises --     Knee/Hip Exercises: Seated   Long Arc Quad Right;10 reps     Knee/Hip Exercises: Supine   Quad Sets Right;10 reps   Heel Slides AAROM;Right;10 reps   Straight Leg Raises AAROM;Right;10 reps     Knee/Hip Exercises: Sidelying   Hip ABduction AROM;Right;10 reps                PT Education - 07/07/16 0944    Education provided Yes   Education Details To keep icing and HEP   Person(s) Educated Patient   Methods Explanation;Handout;Tactile cues   Comprehension Verbalized understanding;Returned demonstration;Verbal cues required;Tactile cues required          PT Short Term Goals - 07/07/16 1022      PT SHORT TERM GOAL #1   Title Pt to be walking in his home with a cane   Time 2   Period Weeks   Status New     PT SHORT TERM GOAL #2   Title Pt ROM in Right knee to be five or less degrees to allow pt to ambulate with a normal gait pattern    Time 2   Period Weeks   Status New     PT SHORT TERM GOAL #3   Title Pt pain in his right knee to be no greater than a 2/10 with medication to allow pt to be weight bearing for 30 minutes straight without resting    Time 4   Period Weeks     PT SHORT TERM GOAL #4   Title Pt flexion of his right knee to be to 105 to allow patient to ascend and descend steps with ease   Time 4   Period Weeks           PT Long Term Goals - 07/07/16 1025      PT LONG TERM GOAL #1   Title Pt to be walking without a cane inside and with a cane on uneven terrain.    Time 6   Period Weeks   Status New     PT LONG TERM GOAL #2   Title Pt Rt knee rom to be to 120 to allow pt to squat down and pick items off the ground with ease.  Time 8   Period Weeks     PT LONG TERM GOAL #3   Title Pt SLS on both legs to be up to 30 seconds to allow pt to feel confident walking on uneven terrain without an assistive device.    Time 8   Period Weeks   Status New      PT LONG TERM GOAL #4   Title Pt to be able to tolerate being weight bearing for an hour and a half without increased pain in his right knee   Time 8   Period Weeks   Status New               Plan - 07/07/16 0945    Clinical Impression Statement Stanley Kelly is a 65 yo male who had a right TKR on 07/05/2016 and is now being referred to skilled physical therapy.  Examination demonstrates decreased ROM, decreased strength, abnormal gait, decreased balance, increased pain, increased edema and difficulty walking.  Stanley Kelly will benefit from skilled physical therapy to address these issues and maximize his functional abillty to return him to his previous functional level.     Rehab Potential Good   PT Frequency 3x / week   PT Duration 8 weeks   PT Treatment/Interventions ADLs/Self Care Home Management;Cryotherapy;Electrical Stimulation;Moist Heat;Gait training;Stair training;Functional mobility training;Therapeutic activities;Therapeutic exercise;Balance training;Neuromuscular re-education;Patient/family education;Manual techniques;Passive range of motion   PT Next Visit Plan begin standing heel raises, functional squats and knee flexion       Patient will benefit from skilled therapeutic intervention in order to improve the following deficits and impairments:  Abnormal gait, Decreased activity tolerance, Decreased balance, Decreased endurance, Decreased range of motion, Decreased strength, Difficulty walking, Increased edema, Impaired flexibility, Pain  Visit Diagnosis: Pain in right knee - Plan: PT plan of care cert/re-cert  Stiffness of right knee, not elsewhere classified - Plan: PT plan of care cert/re-cert  Difficulty in walking, not elsewhere classified - Plan: PT plan of care cert/re-cert  Unsteadiness on feet - Plan: PT plan of care cert/re-cert     Problem List Patient Active Problem List   Diagnosis Date Noted  . S/P knee replacement 07/05/2016  . Primary  osteoarthritis of left hip 08/18/2015  . Primary osteoarthritis of right knee 08/18/2015  . Primary osteoarthritis of left knee 08/18/2015  . History of osteoarthritis 10/07/2014  . Overweight(278.02) 05/16/2012    Rayetta Humphrey, PT CLT [phone] 07/07/2016, 10:36 AM  [facility] [address] [place], [place], [address] Phone: [phone]   Fax:  [phone]  Name: Stanley Kelly MRN: [id number] Date of Birth: [birth year]

## 2016-07-07 NOTE — Patient Instructions (Signed)
ROM: Plantar / Dorsiflexion    With left leg relaxed, gently flex and extend ankle. Move through full range of motion. Avoid pain. Repeat ___10_ times per set. Do _1___ sets per session. Do _3___ sessions per day.  http://orth.exer.us/34   Copyright  VHI. All rights reserved.  Knee Extension (Sitting)    Place __0__ pound weight on left ankle and straighten knee fully, lower slowly. Repeat 10____ times per set. Do ___1_ sets per session. Do ___3_ sessions per day.  http://orth.exer.us/732   Copyright  VHI. All rights reserved.  Strengthening: Quadriceps Set    Tighten muscles on top of thighs by pushing knees down into surface. Hold __3-5__ seconds. Repeat _10___ times per set. Do __1__ sets per session. Do ___3_ sessions per day.  http://orth.exer.us/602   Copyright  VHI. All rights reserved.  Self-Mobilization: Heel Slide (Supine)    Slide right heel toward buttocks until a gentle stretch is felt. Hold __3-5__ seconds. Relax. Repeat _10___ times per set. Do __1__ sets per session. Do ____ sessions per day. 3 http://orth.exer.us/710   Copyright  VHI. All rights reserved.  Strengthening: Straight Leg Raise (Phase 1)    Tighten muscles on front of right thigh, then lift leg 15____ inches from surface, keeping knee locked.  Repeat _10___ times per set. Do ___1_ sets per session. Do 3____ sessions per day.  http://orth.exer.us/614   Copyright  VHI. All rights reserved.  Strengthening: Hip Abduction (Side-Lying)    Tighten muscles on front of left thigh, then lift leg 12____ inches from surface, keeping knee locked.  Repeat __15__ times per set. Do _1___ sets per session. Do __3__ sessions per day.  http://orth.exer.us/622   Copyright  VHI. All rights reserved.

## 2016-07-07 NOTE — Discharge Summary (Signed)
Physician Discharge Summary  Patient ID: Stanley Kelly MRN: [id number] DOB/AGE: 1951-02-07 65 y.o.  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  He underwent uncomplicated right total knee replacement.  He progressed recently well in therapy.  He was cleared from ambulation and was discharged home.  He will follow up in the office In 2 weeks but certainly can come sooner should any problems arise.   Home health has been set up for the patient.  Consults: None  Significant Diagnostic Studies: labs:  Recent Results (from the past 2160 hour(s))  CBC with Differential/Platelet     Status: Abnormal   Collection Time: 05/17/16 10:05 AM  Result Value Ref Range   WBC 3.4 (L) 3.8 - 10.8 K/uL   RBC 4.95 4.20 - 5.80 MIL/uL   Hemoglobin 15.4 13.2 - 17.1 g/dL   HCT 43.9 38.5 - 50.0 %   MCV 88.7 80.0 - 100.0 fL   MCH 31.1 27.0 - 33.0 pg   MCHC 35.1 32.0 - 36.0 g/dL   RDW 13.2 11.0 - 15.0 %   Platelets 268 140 - 400 K/uL   MPV 9.6 7.5 - 12.5 fL   Neutro Abs 1,938 1,500 - 7,800 cells/uL   Lymphs Abs 1,020 850 - 3,900 cells/uL   Monocytes Absolute 306 200 - 950 cells/uL   Eosinophils Absolute 102 15 - 500 cells/uL   Basophils Absolute 34 0 - 200 cells/uL   Neutrophils Relative % 57 %   Lymphocytes Relative 30 %   Monocytes Relative 9 %   Eosinophils Relative 3 %   Basophils Relative 1 %   Smear Review Criteria for review not met     Comment: ** Please note change in unit of measure and reference range(s). **  COMPLETE METABOLIC PANEL WITH GFR     Status: None   Collection Time: 05/17/16 10:05 AM  Result Value Ref Range   Sodium 138 135 - 146 mmol/L   Potassium 4.2  3.5 - 5.3 mmol/L   Chloride 103 98 - 110 mmol/L   CO2 25 20 - 31 mmol/L   Glucose, Bld 99 65 - 99 mg/dL   BUN 25 7 - 25 mg/dL   Creat 0.88 0.70 - 1.25 mg/dL    Comment:   For patients > or = 63 years of age: The upper reference limit for Creatinine is approximately 13% higher for people identified as African-American.      Total Bilirubin 0.5 0.2 - 1.2 mg/dL   Alkaline Phosphatase 65 40 - 115 U/L   AST 32 10 - 35 U/L   ALT 42 9 - 46 U/L   Total Protein 7.3 6.1 - 8.1 g/dL   Albumin 5.0 3.6 - 5.1 g/dL   Calcium 10.0 8.6 - 10.3 mg/dL   GFR, Est African American >89 >=60 mL/min   GFR, Est Non African American >89 >=60 mL/min  Lipid panel     Status: None   Collection Time: 05/17/16 10:05 AM  Result Value Ref Range   Cholesterol 192 125 - 200 mg/dL   Triglycerides 127 <150 mg/dL   HDL 46 >=40 mg/dL   Total CHOL/HDL Ratio 4.2 <=5.0 Ratio   VLDL 25 <30 mg/dL   LDL Cholesterol 121 <  130 mg/dL    Comment:   Total Cholesterol/HDL Ratio:CHD Risk                        Coronary Heart Disease Risk Table                                        Men       Women          1/2 Average Risk              3.4        3.3              Average Risk              5.0        4.4           2X Average Risk              9.6        7.1           3X Average Risk             23.4       11.0 Use the calculated Patient Ratio above and the CHD Risk table  to determine the patient's CHD Risk.   IFOBT POC (occult bld, rslt in office)     Status: None   Collection Time: 05/21/16  6:30 PM  Result Value Ref Range   IFOBT Negative     Comment: pt did three samples all three were neg  Urinalysis, Routine w reflex microscopic (not at [facility])     Status: None   Collection Time: 06/25/16  8:44 AM  Result Value Ref Range   Color, Urine YELLOW YELLOW   APPearance CLEAR CLEAR   Specific Gravity, Urine 1.018 1.005 - 1.030   pH 5.5 5.0 - 8.0   Glucose, UA NEGATIVE NEGATIVE mg/dL   Hgb urine dipstick NEGATIVE NEGATIVE    Bilirubin Urine NEGATIVE NEGATIVE   Ketones, ur NEGATIVE NEGATIVE mg/dL   Protein, ur NEGATIVE NEGATIVE mg/dL   Nitrite NEGATIVE NEGATIVE   Leukocytes, UA NEGATIVE NEGATIVE    Comment: MICROSCOPIC NOT DONE ON URINES WITH NEGATIVE PROTEIN, BLOOD, LEUKOCYTES, NITRITE, OR GLUCOSE <1000 mg/dL.  Surgical pcr screen     Status: None   Collection Time: 06/25/16  8:44 AM  Result Value Ref Range   MRSA, PCR NEGATIVE NEGATIVE   Staphylococcus aureus NEGATIVE NEGATIVE    Comment:        The Xpert SA Assay (FDA approved for NASAL specimens in patients over 5 years of age), is one component of a comprehensive surveillance program.  Test performance has been validated by [facility] for patients greater than or equal to 4 year old. It is not intended to diagnose infection nor to guide or monitor treatment.   APTT     Status: None   Collection Time: 06/25/16  8:45 AM  Result Value Ref Range   aPTT 28 24 - 37 seconds  CBC WITH DIFFERENTIAL     Status: Abnormal   Collection Time: 06/25/16  8:45 AM  Result Value Ref Range   WBC 3.0 (L) 4.0 - 10.5 K/uL   RBC 4.63 4.22 - 5.81 MIL/uL   Hemoglobin 14.2 13.0 - 17.0 g/dL   HCT 41.4 39.0 - 52.0 %   MCV  89.4 78.0 - 100.0 fL   MCH 30.7 26.0 - 34.0 pg   MCHC 34.3 30.0 - 36.0 g/dL   RDW 12.5 11.5 - 15.5 %   Platelets 262 150 - 400 K/uL   Neutrophils Relative % 41 %   Neutro Abs 1.3 (L) 1.7 - 7.7 K/uL   Lymphocytes Relative 45 %   Lymphs Abs 1.4 0.7 - 4.0 K/uL   Monocytes Relative 10 %   Monocytes Absolute 0.3 0.1 - 1.0 K/uL   Eosinophils Relative 3 %   Eosinophils Absolute 0.1 0.0 - 0.7 K/uL   Basophils Relative 1 %   Basophils Absolute 0.0 0.0 - 0.1 K/uL  Comprehensive metabolic panel     Status: Abnormal   Collection Time: 06/25/16  8:45 AM  Result Value Ref Range   Sodium 138 135 - 145 mmol/L   Potassium 3.9 3.5 - 5.1 mmol/L   Chloride 107 101 - 111 mmol/L   CO2 25 22 - 32 mmol/L   Glucose, Bld 110 (H) 65 - 99 mg/dL   BUN 13 6 -  20 mg/dL   Creatinine, Ser 0.97 0.61 - 1.24 mg/dL   Calcium 9.7 8.9 - 10.3 mg/dL   Total Protein 6.5 6.5 - 8.1 g/dL   Albumin 4.1 3.5 - 5.0 g/dL   AST 24 15 - 41 U/L   ALT 25 17 - 63 U/L   Alkaline Phosphatase 65 38 - 126 U/L   Total Bilirubin 0.5 0.3 - 1.2 mg/dL   GFR calc non Af Amer >60 >60 mL/min   GFR calc Af Amer >60 >60 mL/min    Comment: (NOTE) The eGFR has been calculated using the CKD EPI equation. This calculation has not been validated in all clinical situations. eGFR's persistently <60 mL/min signify possible Chronic Kidney Disease.    Anion gap 6 5 - 15  Protime-INR     Status: None   Collection Time: 06/25/16  8:45 AM  Result Value Ref Range   Prothrombin Time 13.9 11.6 - 15.2 seconds   INR 1.05 0.00 - 1.49  Type and screen Order type and screen if day of surgery is less than 15 days from draw of preadmission visit or order morning of surgery if day of surgery is greater than 6 days from preadmission visit.     Status: None   Collection Time: 06/25/16  9:00 AM  Result Value Ref Range   ABO/RH(D) AB POS    Antibody Screen NEG    Sample Expiration 07/09/2016    Extend sample reason NO TRANSFUSIONS OR PREGNANCY IN THE PAST 3 MONTHS   CBC     Status: Abnormal   Collection Time: 07/06/16  5:55 AM  Result Value Ref Range   WBC 5.5 4.0 - 10.5 K/uL   RBC 3.84 (L) 4.22 - 5.81 MIL/uL   Hemoglobin 11.7 (L) 13.0 - 17.0 g/dL   HCT 34.8 (L) 39.0 - 52.0 %   MCV 90.6 78.0 - 100.0 fL   MCH 30.5 26.0 - 34.0 pg   MCHC 33.6 30.0 - 36.0 g/dL   RDW 12.7 11.5 - 15.5 %   Platelets 208 150 - 400 K/uL  Basic metabolic panel     Status: Abnormal   Collection Time: 07/06/16  5:55 AM  Result Value Ref Range   Sodium 138 135 - 145 mmol/L   Potassium 3.6 3.5 - 5.1 mmol/L   Chloride 105 101 - 111 mmol/L   CO2 28 22 - 32 mmol/L   Glucose,  Bld 100 (H) 65 - 99 mg/dL   BUN 14 6 - 20 mg/dL   Creatinine, Ser 0.82 0.61 - 1.24 mg/dL   Calcium 8.9 8.9 - 10.3 mg/dL   GFR calc non Af Amer  >60 >60 mL/min   GFR calc Af Amer >60 >60 mL/min    Comment: (NOTE) The eGFR has been calculated using the CKD EPI equation. This calculation has not been validated in all clinical situations. eGFR's persistently <60 mL/min signify possible Chronic Kidney Disease.    Anion gap 5 5 - 15    Treatments: IV hydration and antibiotics: Ancef  Discharge Exam: Blood pressure (!) 104/58, pulse 63, temperature 98.1 F (36.7 C), temperature source Oral, resp. rate 18, height '5\' 9"'  (1.753 m), weight 97.3 kg (214 lb 9.6 oz), SpO2 99 %. General appearance: alert Well-developed well-nourished patient in no acute distress. Alert and oriented x3 HEENT:within normal limits Cardiac: Regular rate and rhythm Pulmonary: Lungs clear to auscultation Abdomen: Soft and nontender.  Normal active bowel sounds  Musculoskeletal: (Right knee  On presentation had limited and painful range of motion.  Postoperatively he had limited range of motion and significant pain.  Disposition: 01-Home or Self Care     Medication List    STOP taking these medications   aspirin 81 MG tablet Replaced by:  aspirin 325 MG EC tablet   meloxicam 15 MG tablet Commonly known as:  MOBIC     TAKE these medications   aspirin 325 MG EC tablet Take 1 tablet (325 mg total) by mouth daily with breakfast. Replaces:  aspirin 81 MG tablet   aspirin EC 325 MG tablet Take 1 tablet (325 mg total) by mouth 2 (two) times daily.   FIBER PO Take 1 tablet by mouth daily.   methocarbamol 500 MG tablet Commonly known as:  ROBAXIN Take 1 tablet (500 mg total) by mouth every 6 (six) hours as needed for muscle spasms.   multivitamin with minerals Tabs tablet Take 1 tablet by mouth daily.   niacin 50 MG tablet Take 50 mg by mouth daily with breakfast.   OMEGA 3-6-9 COMPLEX PO Take 2 capsules by mouth daily.   omeprazole 20 MG capsule Commonly known as:  PRILOSEC Take 40 mg by mouth daily.   oxyCODONE-acetaminophen 5-325 MG  tablet Commonly known as:  ROXICET Take 1-2 tablets by mouth every 4 (four) hours as needed for severe pain.   promethazine 50 MG tablet Commonly known as:  PHENERGAN Take 1 tablet (50 mg total) by mouth every 6 (six) hours as needed for nausea or vomiting.   Red Yeast Rice 600 MG Caps Take 600 mg by mouth 2 (two) times daily.   vitamin B-12 1000 MCG tablet Commonly known as:  CYANOCOBALAMIN Take 1,000 mcg by mouth daily.   vitamin C 500 MG tablet Commonly known as:  ASCORBIC ACID Take 1,000 mg by mouth daily.   vitamin E 100 UNIT capsule Take 200 Units by mouth daily.      Follow-up Information    Lorisa Scheid L, MD Follow up in 2 week(s).   Specialty:  Orthopedic Surgery Contact information: [place] [address] [phone]        [place] .   Why:  Patient already has scheduled appointment to begin outpatient therapy. 07/07/16 Contact information: [address] [place] [address] [phone]          Signed: Alta Corning 07/07/2016, 6:43 PM

## 2016-07-09 ENCOUNTER — Ambulatory Visit (HOSPITAL_COMMUNITY): Payer: Managed Care, Other (non HMO) | Admitting: Physical Therapy

## 2016-07-09 DIAGNOSIS — M25561 Pain in right knee: Secondary | ICD-10-CM | POA: Diagnosis not present

## 2016-07-09 DIAGNOSIS — R262 Difficulty in walking, not elsewhere classified: Secondary | ICD-10-CM

## 2016-07-09 DIAGNOSIS — R2681 Unsteadiness on feet: Secondary | ICD-10-CM

## 2016-07-09 DIAGNOSIS — M25661 Stiffness of right knee, not elsewhere classified: Secondary | ICD-10-CM

## 2016-07-09 NOTE — Therapy (Signed)
[facility] [place], [place], [address] Phone: [phone]   Fax:  [phone]  Physical Therapy Treatment  Patient Details  Name: Stanley Kelly MRN: [id number] Date of Birth: 12/07/1950 Referring Provider: Dorna Leitz  Encounter Date: 07/09/2016      PT End of Session - 07/09/16 1029    Visit Number 2   Number of Visits 24   Date for PT Re-Evaluation 08/06/16   Authorization Type [employer]   Authorization - Visit Number 2   Authorization - Number of Visits 10   PT Start Time [phone]   PT Stop Time 1028   PT Time Calculation (min) 41 min   Activity Tolerance Patient tolerated treatment well   Behavior During Therapy [facility] for tasks assessed/performed      Past Medical History:  Diagnosis Date  . Arthritis   . Hx of knee surgery     Past Surgical History:  Procedure Laterality Date  . INJECTION KNEE Bilateral 08/18/2015   Procedure: BILATERAL TOTAL KNEE BILATERAL CORTISONE INJECTION;  Surgeon: Dorna Leitz, MD;  Location: [place];  Service: Orthopedics;  Laterality: Bilateral;  . JOINT REPLACEMENT    . KNEE SURGERY    . TOTAL HIP ARTHROPLASTY Left 08/18/2015   Procedure: TOTAL HIP ARTHROPLASTY ANTERIOR APPROACH;  Surgeon: Dorna Leitz, MD;  Location: [place];  Service: Orthopedics;  Laterality: Left;  . TOTAL KNEE ARTHROPLASTY Right 07/05/2016   Procedure: RIGHT TOTAL KNEE ARTHROPLASTY;  Surgeon: Dorna Leitz, MD;  Location: [place];  Service: Orthopedics;  Laterality: Right;  Marland Kitchen VASECTOMY  1985    There were no vitals filed for this visit.      Subjective Assessment - 07/09/16 0949    Subjective Pt reports pain currently. States he is having a rough day. He is trying to use his machine 3x/day for 2 hours at a time.    Pertinent History Lt THR;    How long can you sit comfortably? Has not tried to sit for prolong time    How long can you stand comfortably? Has not tried   How long can you walk comfortably? to the car  approximately 100 ft at a time with the walker    Currently in Pain? Yes   Pain Score 8    Pain Location Knee   Pain Orientation Right   Pain Type Surgical pain   Pain Onset In the past 7 days                         [facility] Adult PT Treatment/Exercise - 07/09/16 0001      Ambulation/Gait   Ambulation/Gait Yes   Ambulation/Gait Assistance 6: Modified independent (Device/Increase time)   Ambulation Distance (Feet) 120 Feet   Assistive device Rolling walker   Gait Pattern Decreased dorsiflexion - right;Decreased hip/knee flexion - right;Decreased stance time - right;Decreased step length - left   Gait Comments verbal cues to improve heel contact/knee flexion; adjusted height of RW     Knee/Hip Exercises: Standing   Other Standing Knee Exercises retrostepping in // bars 2x2 min      Knee/Hip Exercises: Seated   Other Seated Knee/Hip Exercises Rt knee flexion stretch 10x5 sec each      Knee/Hip Exercises: Supine   Quad Sets Right;15 reps;1 set   Quad Sets Limitations 3 sec hold    Heel Slides Right;1 set;10 reps   Heel Slides Limitations 10 sec hold    Other Supine  Knee/Hip Exercises qlute sets x20 reps                 PT Education - 07/09/16 1026    Education provided Yes   Education Details reviewed HEP/technique; gait mechanics; parameters for ice/elevation to manage pain and swelling   Person(s) Educated Patient   Methods Explanation;Demonstration;Verbal cues   Comprehension Verbalized understanding;Returned demonstration;Need further instruction          PT Short Term Goals - 07/07/16 1022      PT SHORT TERM GOAL #1   Title Pt to be walking in his home with a cane   Time 2   Period Weeks   Status New     PT SHORT TERM GOAL #2   Title Pt ROM in Right knee to be five or less degrees to allow pt to ambulate with a normal gait pattern    Time 2   Period Weeks   Status New     PT SHORT TERM GOAL #3   Title Pt pain in his right knee to be  no greater than a 2/10 with medication to allow pt to be weight bearing for 30 minutes straight without resting    Time 4   Period Weeks     PT SHORT TERM GOAL #4   Title Pt flexion of his right knee to be to 105 to allow patient to ascend and descend steps with ease   Time 4   Period Weeks           PT Long Term Goals - 07/07/16 1025      PT LONG TERM GOAL #1   Title Pt to be walking without a cane inside and with a cane on uneven terrain.    Time 6   Period Weeks   Status New     PT LONG TERM GOAL #2   Title Pt Rt knee rom to be to 120 to allow pt to squat down and pick items off the ground with ease.   Time 8   Period Weeks     PT LONG TERM GOAL #3   Title Pt SLS on both legs to be up to 30 seconds to allow pt to feel confident walking on uneven terrain without an assistive device.    Time 8   Period Weeks   Status New     PT LONG TERM GOAL #4   Title Pt to be able to tolerate being weight bearing for an hour and a half without increased pain in his right knee   Time 8   Period Weeks   Status New               Plan - 07/09/16 1207    Clinical Impression Statement Pt presents to the clinic with continued reports of swelling, pain and stiffness. Session focused on therex to improve knee ROM and I reviewed correct mechanics with gait using RW. He initially demonstrated slow speed with decreased knee flexion and heel contact which improved after verbal cues and education were provided. Discussed parameters for ice/elevation to manage swelling and educated pt on the functional importance of gaining knee extension/flexion. He verbalized understanding. Will continue with current POC.   Rehab Potential Good   PT Frequency 3x / week   PT Duration 8 weeks   PT Treatment/Interventions ADLs/Self Care Home Management;Cryotherapy;Electrical Stimulation;Moist Heat;Gait training;Stair training;Functional mobility training;Therapeutic activities;Therapeutic exercise;Balance  training;Neuromuscular re-education;Patient/family education;Manual techniques;Passive range of motion   PT Next Visit Plan edema  management techniques; review and update HEP with knee ROM therex (if not already provided); address knee flexion/ext ROM; gait training in // bars to improve posture   PT Home Exercise Plan No updates this visit    Consulted and Agree with Plan of Care Patient      Patient will benefit from skilled therapeutic intervention in order to improve the following deficits and impairments:  Abnormal gait, Decreased activity tolerance, Decreased balance, Decreased endurance, Decreased range of motion, Decreased strength, Difficulty walking, Increased edema, Impaired flexibility, Pain  Visit Diagnosis: Pain in right knee  Difficulty in walking, not elsewhere classified  Unsteadiness on feet  Stiffness of right knee, not elsewhere classified     Problem List Patient Active Problem List   Diagnosis Date Noted  . S/P knee replacement 07/05/2016  . Primary osteoarthritis of left hip 08/18/2015  . Primary osteoarthritis of right knee 08/18/2015  . Primary osteoarthritis of left knee 08/18/2015  . History of osteoarthritis 10/07/2014  . Overweight(278.02) 05/16/2012    12:13 PM,07/09/16 Elly Modena PT, DPT [facility] [place] [address] [place], [place], [address] Phone: [phone]   Fax:  [phone]  Name: Stanley Kelly MRN: [id number] Date of Birth: November 04, 1951

## 2016-07-12 ENCOUNTER — Ambulatory Visit (HOSPITAL_COMMUNITY): Payer: Managed Care, Other (non HMO) | Admitting: Physical Therapy

## 2016-07-12 DIAGNOSIS — R262 Difficulty in walking, not elsewhere classified: Secondary | ICD-10-CM

## 2016-07-12 DIAGNOSIS — R2681 Unsteadiness on feet: Secondary | ICD-10-CM

## 2016-07-12 DIAGNOSIS — M25561 Pain in right knee: Secondary | ICD-10-CM | POA: Diagnosis not present

## 2016-07-12 DIAGNOSIS — M25661 Stiffness of right knee, not elsewhere classified: Secondary | ICD-10-CM

## 2016-07-12 NOTE — Therapy (Signed)
[place], [place], [address] Phone: [phone]   Fax:  [phone]  Physical Therapy Treatment  Patient Details  Name: Stanley Kelly MRN: [id number] Date of Birth: Sep 11, 1951 Referring Provider: Dorna Leitz  Encounter Date: 07/12/2016      PT End of Session - 07/12/16 1113    Visit Number 2   Number of Visits 24   Date for PT Re-Evaluation 08/06/16   Authorization Type [employer]   Authorization - Visit Number 2   Authorization - Number of Visits 10   PT Start Time 0815   PT Stop Time 0900   PT Time Calculation (min) 45 min   Activity Tolerance Patient tolerated treatment well   Behavior During Therapy [facility] for tasks assessed/performed      Past Medical History:  Diagnosis Date  . Arthritis   . Hx of knee surgery     Past Surgical History:  Procedure Laterality Date  . INJECTION KNEE Bilateral 08/18/2015   Procedure: BILATERAL TOTAL KNEE BILATERAL CORTISONE INJECTION;  Surgeon: Dorna Leitz, MD;  Location: [place];  Service: Orthopedics;  Laterality: Bilateral;  . JOINT REPLACEMENT    . KNEE SURGERY    . TOTAL HIP ARTHROPLASTY Left 08/18/2015   Procedure: TOTAL HIP ARTHROPLASTY ANTERIOR APPROACH;  Surgeon: Dorna Leitz, MD;  Location: [place];  Service: Orthopedics;  Laterality: Left;  . TOTAL KNEE ARTHROPLASTY Right 07/05/2016   Procedure: RIGHT TOTAL KNEE ARTHROPLASTY;  Surgeon: Dorna Leitz, MD;  Location: [place];  Service: Orthopedics;  Laterality: Right;  Marland Kitchen VASECTOMY  1985    There were no vitals filed for this visit.      Subjective Assessment - 07/12/16 0823    Subjective Pt states he is having pain all down the back of his Rt leg from hamstring area to ankle. Gives 8/10 pain rating.  States he is completely worn out and stressed.  suppose to have his Lt knee done in 5 weeks.    Currently in Pain? Yes   Pain Score 8    Pain Location Knee   Pain Orientation Right   Pain Descriptors / Indicators  Sharp;Constant   Pain Type Surgical pain                         [facility] Adult PT Treatment/Exercise - 07/12/16 0001      Ambulation/Gait   Gait Comments verbal cues to improve heel contact/knee flexion; adjusted height of RW     Knee/Hip Exercises: Stretches   Knee: Self-Stretch to increase Flexion Right;10 seconds   Knee: Self-Stretch Limitations 10 reps   Gastroc Stretch Right;3 reps;20 seconds     Knee/Hip Exercises: Standing   Heel Raises Both;10 reps   Heel Raises Limitations toe raises 10 reps   Knee Flexion Right;10 reps   Functional Squat --     Knee/Hip Exercises: Supine   Quad Sets Right;15 reps;1 set   Heel Slides Right;1 set;10 reps     Manual Therapy   Manual Therapy Edema management   Manual therapy comments supine completed at EOS seperate from all other skilled interventions   Edema Management retro massage to decrease edema with LE elevated                   PT Short Term Goals - 07/07/16 1022      PT SHORT TERM GOAL #1   Title Pt to be walking in his home with a  cane   Time 2   Period Weeks   Status New     PT SHORT TERM GOAL #2   Title Pt ROM in Right knee to be five or less degrees to allow pt to ambulate with a normal gait pattern    Time 2   Period Weeks   Status New     PT SHORT TERM GOAL #3   Title Pt pain in his right knee to be no greater than a 2/10 with medication to allow pt to be weight bearing for 30 minutes straight without resting    Time 4   Period Weeks     PT SHORT TERM GOAL #4   Title Pt flexion of his right knee to be to 105 to allow patient to ascend and descend steps with ease   Time 4   Period Weeks           PT Long Term Goals - 07/07/16 1025      PT LONG TERM GOAL #1   Title Pt to be walking without a cane inside and with a cane on uneven terrain.    Time 6   Period Weeks   Status New     PT LONG TERM GOAL #2   Title Pt Rt knee rom to be to 120 to allow pt to squat down and pick  items off the ground with ease.   Time 8   Period Weeks     PT LONG TERM GOAL #3   Title Pt SLS on both legs to be up to 30 seconds to allow pt to feel confident walking on uneven terrain without an assistive device.    Time 8   Period Weeks   Status New     PT LONG TERM GOAL #4   Title Pt to be able to tolerate being weight bearing for an hour and a half without increased pain in his right knee   Time 8   Period Weeks   Status New               Plan - 07/12/16 1114    Clinical Impression Statement continued discomfort with noted antalgic gait.  Cues to walk into walker to keep upright posturing.  Focused continued on ROM of Rt knee.  Pt with proximal LE bruising and overall discomfort but without induration.  Added standing flexion stretch and gastroc stretch as well as retro massage to help decrease swelling and discomfort.  Pt reported overall increased comfort at EOS.     Rehab Potential Good   PT Frequency 3x / week   PT Duration 8 weeks   PT Treatment/Interventions ADLs/Self Care Home Management;Cryotherapy;Electrical Stimulation;Moist Heat;Gait training;Stair training;Functional mobility training;Therapeutic activities;Therapeutic exercise;Balance training;Neuromuscular re-education;Patient/family education;Manual techniques;Passive range of motion   PT Next Visit Plan edema management techniques; review and update HEP with knee ROM therex (if not already provided); address knee flexion/ext ROM; gait training in // bars to improve posture   PT Home Exercise Plan No updates this visit    Consulted and Agree with Plan of Care Patient      Patient will benefit from skilled therapeutic intervention in order to improve the following deficits and impairments:  Abnormal gait, Decreased activity tolerance, Decreased balance, Decreased endurance, Decreased range of motion, Decreased strength, Difficulty walking, Increased edema, Impaired flexibility, Pain  Visit Diagnosis: Pain  in right knee  Difficulty in walking, not elsewhere classified  Unsteadiness on feet  Stiffness of right knee, not elsewhere classified  Problem List Patient Active Problem List   Diagnosis Date Noted  . S/P knee replacement 07/05/2016  . Primary osteoarthritis of left hip 08/18/2015  . Primary osteoarthritis of right knee 08/18/2015  . Primary osteoarthritis of left knee 08/18/2015  . History of osteoarthritis 10/07/2014  . Overweight(278.02) 05/16/2012    Teena Irani, PTA/CLT [phone]  07/12/2016, 11:17 AM  [place], [place], [address] Phone: [phone]   Fax:  [phone]  Name: CAYLON GRUNOW MRN: [id number] Date of Birth: Aug 26, 1951

## 2016-07-14 ENCOUNTER — Ambulatory Visit (HOSPITAL_COMMUNITY): Payer: Managed Care, Other (non HMO)

## 2016-07-14 DIAGNOSIS — M25561 Pain in right knee: Secondary | ICD-10-CM | POA: Diagnosis not present

## 2016-07-14 DIAGNOSIS — M25661 Stiffness of right knee, not elsewhere classified: Secondary | ICD-10-CM

## 2016-07-14 DIAGNOSIS — R262 Difficulty in walking, not elsewhere classified: Secondary | ICD-10-CM

## 2016-07-14 DIAGNOSIS — R2681 Unsteadiness on feet: Secondary | ICD-10-CM

## 2016-07-14 NOTE — Therapy (Signed)
[place], [place], [address] Phone: [phone]   Fax:  [phone]  Physical Therapy Treatment  Patient Details  Name: Stanley Kelly MRN: [id number] Date of Birth: [birth year] Referring Provider: Dorna Leitz  Encounter Date: 07/14/2016      PT End of Session - 07/14/16 0912    Visit Number 4   Number of Visits 24   Date for PT Re-Evaluation 08/06/16   Authorization Type [employer]   Authorization - Visit Number 4   Authorization - Number of Visits 10   PT Start Time 0908   PT Stop Time 0948   PT Time Calculation (min) 40 min   Activity Tolerance Patient tolerated treatment well   Behavior During Therapy [facility] for tasks assessed/performed      Past Medical History:  Diagnosis Date  . Arthritis   . Hx of knee surgery     Past Surgical History:  Procedure Laterality Date  . INJECTION KNEE Bilateral 08/18/2015   Procedure: BILATERAL TOTAL KNEE BILATERAL CORTISONE INJECTION;  Surgeon: Dorna Leitz, MD;  Location: [place];  Service: Orthopedics;  Laterality: Bilateral;  . JOINT REPLACEMENT    . KNEE SURGERY    . TOTAL HIP ARTHROPLASTY Left 08/18/2015   Procedure: TOTAL HIP ARTHROPLASTY ANTERIOR APPROACH;  Surgeon: Dorna Leitz, MD;  Location: [place];  Service: Orthopedics;  Laterality: Left;  . TOTAL KNEE ARTHROPLASTY Right 07/05/2016   Procedure: RIGHT TOTAL KNEE ARTHROPLASTY;  Surgeon: Dorna Leitz, MD;  Location: [place];  Service: Orthopedics;  Laterality: Right;  Marland Kitchen VASECTOMY  1985    There were no vitals filed for this visit.      Subjective Assessment - 07/14/16 0908    Subjective Pt states knee feels really tight around knee today, pain scale 2/10.     Pertinent History Lt THR;    Currently in Pain? Yes   Pain Score 2    Pain Location Knee   Pain Orientation Right                         [facility] Adult PT Treatment/Exercise - 07/14/16 0001      Ambulation/Gait   Ambulation/Gait Yes   Ambulation/Gait Assistance 6: Modified independent (Device/Increase time)   Ambulation Distance (Feet) 452 Feet   Assistive device Rolling walker   Gait Pattern Decreased dorsiflexion - right;Decreased hip/knee flexion - right;Decreased stance time - right;Decreased step length - left   Gait Comments verbal cues to improve heel contact/knee flexion and improve posture     Knee/Hip Exercises: Stretches   Active Hamstring Stretch 3 reps;30 seconds   Active Hamstring Stretch Limitations supine with towel   Knee: Self-Stretch to increase Flexion Right;10 seconds   Knee: Self-Stretch Limitations 10 reps   Other Knee/Hip Stretches Extension following anterior hip precautions to improve posture     Knee/Hip Exercises: Standing   Gait Training 452 feet with focus on heel to toe and cueing for posture     Knee/Hip Exercises: Supine   Quad Sets Right;15 reps;1 set   Short Arc Quad Sets AAROM;10 reps   Heel Slides Right;1 set;10 reps     Manual Therapy   Manual Therapy Edema management;Joint mobilization;Passive ROM   Manual therapy comments supine completed at EOS seperate from all other skilled interventions   Edema Management retro massage to decrease edema with LE elevated    Joint Mobilization patella  PT Short Term Goals - 07/07/16 1022      PT SHORT TERM GOAL #1   Title Pt to be walking in his home with a cane   Time 2   Period Weeks   Status New     PT SHORT TERM GOAL #2   Title Pt ROM in Right knee to be five or less degrees to allow pt to ambulate with a normal gait pattern    Time 2   Period Weeks   Status New     PT SHORT TERM GOAL #3   Title Pt pain in his right knee to be no greater than a 2/10 with medication to allow pt to be weight bearing for 30 minutes straight without resting    Time 4   Period Weeks     PT SHORT TERM GOAL #4   Title Pt flexion of his right knee to be to 105 to allow patient to ascend and descend steps with ease    Time 4   Period Weeks           PT Long Term Goals - 07/07/16 1025      PT LONG TERM GOAL #1   Title Pt to be walking without a cane inside and with a cane on uneven terrain.    Time 6   Period Weeks   Status New     PT LONG TERM GOAL #2   Title Pt Rt knee rom to be to 120 to allow pt to squat down and pick items off the ground with ease.   Time 8   Period Weeks     PT LONG TERM GOAL #3   Title Pt SLS on both legs to be up to 30 seconds to allow pt to feel confident walking on uneven terrain without an assistive device.    Time 8   Period Weeks   Status New     PT LONG TERM GOAL #4   Title Pt to be able to tolerate being weight bearing for an hour and a half without increased pain in his right knee   Time 8   Period Weeks   Status New               Plan - 07/14/16 1011    Clinical Impression Statement Session focus on improving ROM and gait mechanics to improve posture and assist with ROM.  Manual techniuqes complete initially this session to assist wtih edema control with therex focus on ROM following.  Pt continues to have proximal LE bruising and some discomfort with flexion based exercises, pt feels staples/sutures are limiting ability to flex knee; MD apt next week.  Gait training with education on importance of proper posture and heel to toe pattern to improve gait mechanics.  No report of pain through session, pt with improved posture and overall gait mechanics at EOS.     Rehab Potential Good   PT Frequency 3x / week   PT Duration 8 weeks   PT Treatment/Interventions ADLs/Self Care Home Management;Cryotherapy;Electrical Stimulation;Moist Heat;Gait training;Stair training;Functional mobility training;Therapeutic activities;Therapeutic exercise;Balance training;Neuromuscular re-education;Patient/family education;Manual techniques;Passive range of motion   PT Next Visit Plan edema management techniques; review and update HEP with knee ROM therex (if not already  provided); address knee flexion/ext ROM; gait training in // bars to improve posture   PT Home Exercise Plan reviewed HEP, no additional exercises given this session.        Patient will benefit from skilled therapeutic intervention  in order to improve the following deficits and impairments:  Abnormal gait, Decreased activity tolerance, Decreased balance, Decreased endurance, Decreased range of motion, Decreased strength, Difficulty walking, Increased edema, Impaired flexibility, Pain  Visit Diagnosis: Pain in right knee  Difficulty in walking, not elsewhere classified  Unsteadiness on feet  Stiffness of right knee, not elsewhere classified     Problem List Patient Active Problem List   Diagnosis Date Noted  . S/P knee replacement 07/05/2016  . Primary osteoarthritis of left hip 08/18/2015  . Primary osteoarthritis of right knee 08/18/2015  . Primary osteoarthritis of left knee 08/18/2015  . History of osteoarthritis 10/07/2014  . Overweight(278.02) 05/16/2012   Ihor Austin, [place]; [place]  Aldona Lento 07/14/2016, 2:05 PM  [place] [address] [place], [place], [address] Phone: [phone]   Fax:  [phone]  Name: Stanley Kelly MRN: [id number] Date of Birth: 07-15-1951

## 2016-07-16 ENCOUNTER — Ambulatory Visit (HOSPITAL_COMMUNITY): Payer: Managed Care, Other (non HMO) | Admitting: Physical Therapy

## 2016-07-16 DIAGNOSIS — R262 Difficulty in walking, not elsewhere classified: Secondary | ICD-10-CM

## 2016-07-16 DIAGNOSIS — M25561 Pain in right knee: Secondary | ICD-10-CM | POA: Diagnosis not present

## 2016-07-16 DIAGNOSIS — M25661 Stiffness of right knee, not elsewhere classified: Secondary | ICD-10-CM

## 2016-07-16 DIAGNOSIS — R2681 Unsteadiness on feet: Secondary | ICD-10-CM

## 2016-07-16 NOTE — Therapy (Signed)
[place], [place], [address] Phone: [phone]   Fax:  [phone]  Physical Therapy Treatment  Patient Details  Name: Stanley Kelly MRN: [id number] Date of Birth: 1951-11-04 Referring Provider: Dorna Leitz  Encounter Date: 07/16/2016      PT End of Session - 07/16/16 0941    Visit Number 5   Number of Visits 24   Date for PT Re-Evaluation 08/06/16   Authorization Type [employer]   Authorization - Visit Number 5   Authorization - Number of Visits 10   PT Start Time 0905   PT Stop Time 0945   PT Time Calculation (min) 40 min   Activity Tolerance Patient tolerated treatment well   Behavior During Therapy [facility] for tasks assessed/performed      Past Medical History:  Diagnosis Date  . Arthritis   . Hx of knee surgery     Past Surgical History:  Procedure Laterality Date  . INJECTION KNEE Bilateral 08/18/2015   Procedure: BILATERAL TOTAL KNEE BILATERAL CORTISONE INJECTION;  Surgeon: Dorna Leitz, MD;  Location: [place];  Service: Orthopedics;  Laterality: Bilateral;  . JOINT REPLACEMENT    . KNEE SURGERY    . TOTAL HIP ARTHROPLASTY Left 08/18/2015   Procedure: TOTAL HIP ARTHROPLASTY ANTERIOR APPROACH;  Surgeon: Dorna Leitz, MD;  Location: [place];  Service: Orthopedics;  Laterality: Left;  . TOTAL KNEE ARTHROPLASTY Right 07/05/2016   Procedure: RIGHT TOTAL KNEE ARTHROPLASTY;  Surgeon: Dorna Leitz, MD;  Location: [place];  Service: Orthopedics;  Laterality: Right;  Marland Kitchen VASECTOMY  1985    There were no vitals filed for this visit.      Subjective Assessment - 07/16/16 0908    Subjective Pt states that his Lt knee is giving him more pain than her right knee.    Pertinent History Lt THR;    Currently in Pain? Yes   Pain Score 7    Pain Location Knee   Pain Orientation Right   Pain Descriptors / Indicators Aching;Throbbing   Pain Type Surgical pain   Pain Onset Today   Aggravating Factors  ice    Pain Relieving  Factors ice    Effect of Pain on Daily Activities increases pain                          [facility] Adult PT Treatment/Exercise - 07/16/16 0001      Exercises   Exercises Knee/Hip     Knee/Hip Exercises: Stretches   Passive Hamstring Stretch Right;60 seconds     Knee/Hip Exercises: Standing   Heel Raises 15 reps     Knee/Hip Exercises: Seated   Long Arc Quad 10 reps   Heel Slides 10 reps     Knee/Hip Exercises: Supine   Quad Sets 15 reps   Short Arc Quad Sets 15 reps   Heel Slides 15 reps   Straight Leg Raises 10 reps   Knee Extension Limitations 7   Knee Flexion Limitations 75     Manual Therapy   Manual Therapy Edema management;Joint mobilization;Passive ROM   Manual therapy comments supine completed at EOS seperate from all other skilled interventions   Edema Management decongestive techniques from hip to ankle    Joint Mobilization patella                  PT Short Term Goals - 07/16/16 0944      PT SHORT TERM GOAL #1  Title Pt to be walking in his home with a cane   Time 2   Period Weeks   Status On-going     PT SHORT TERM GOAL #2   Title Pt ROM in Right knee to be five or less degrees to allow pt to ambulate with a normal gait pattern    Time 2   Period Weeks   Status On-going     PT SHORT TERM GOAL #3   Title Pt pain in his right knee to be no greater than a 2/10 with medication to allow pt to be weight bearing for 30 minutes straight without resting    Time 4   Period Weeks   Status On-going     PT SHORT TERM GOAL #4   Title Pt flexion of his right knee to be to 105 to allow patient to ascend and descend steps with ease   Time 4   Period Weeks   Status On-going           PT Long Term Goals - 07/16/16 0945      PT LONG TERM GOAL #1   Title Pt to be walking without a cane inside and with a cane on uneven terrain.    Time 6   Period Weeks   Status Not Met     PT LONG TERM GOAL #2   Title Pt Rt knee rom to be to  120 to allow pt to squat down and pick items off the ground with ease.   Time 8   Period Weeks   Status Not Met     PT LONG TERM GOAL #3   Title Pt SLS on both legs to be up to 30 seconds to allow pt to feel confident walking on uneven terrain without an assistive device.    Time 8   Period Weeks   Status Not Met     PT LONG TERM GOAL #4   Title Pt to be able to tolerate being weight bearing for an hour and a half without increased pain in his right knee   Time 8   Period Weeks   Status Not Met               Plan - 07/16/16 0941    Clinical Impression Statement Pt treatment began with manual technique to decrease swelling and pain.  Pt treatment continues to focus on ROM and open chained exercises due to high level of pain.  Pt ROM 7-75 degrees today  was 9-60 at evaluation.  Pt  has been walking 60mntues twice a day.  Due to high pain level recommended pt to decrease to 10 minutes twice a day,    Rehab Potential Good   PT Frequency 3x / week   PT Duration 8 weeks   PT Treatment/Interventions ADLs/Self Care Home Management;Cryotherapy;Electrical Stimulation;Moist Heat;Gait training;Stair training;Functional mobility training;Therapeutic activities;Therapeutic exercise;Balance training;Neuromuscular re-education;Patient/family education;Manual techniques;Passive range of motion   PT Next Visit Plan edema management techniques; focus on ROM    PT Home Exercise Plan reviewed HEP, no additional exercises given this session.     Consulted and Agree with Plan of Care Patient      Patient will benefit from skilled therapeutic intervention in order to improve the following deficits and impairments:  Abnormal gait, Decreased activity tolerance, Decreased balance, Decreased endurance, Decreased range of motion, Decreased strength, Difficulty walking, Increased edema, Impaired flexibility, Pain  Visit Diagnosis: Pain in right knee  Difficulty in walking, not elsewhere  classified  Unsteadiness on feet  Stiffness of right knee, not elsewhere classified     Problem List Patient Active Problem List   Diagnosis Date Noted  . S/P knee replacement 07/05/2016  . Primary osteoarthritis of left hip 08/18/2015  . Primary osteoarthritis of right knee 08/18/2015  . Primary osteoarthritis of left knee 08/18/2015  . History of osteoarthritis 10/07/2014  . Overweight(278.02) 05/16/2012    Rayetta Humphrey, PT CLT [phone] 07/16/2016, 9:47 AM  [place] [address] [place], [place], [address] Phone: [phone]   Fax:  [phone]  Name: Stanley Kelly MRN: [id number] Date of Birth: [birth year]

## 2016-07-19 ENCOUNTER — Ambulatory Visit (HOSPITAL_COMMUNITY): Payer: Managed Care, Other (non HMO) | Admitting: Physical Therapy

## 2016-07-19 DIAGNOSIS — M25561 Pain in right knee: Secondary | ICD-10-CM | POA: Diagnosis not present

## 2016-07-19 DIAGNOSIS — R2681 Unsteadiness on feet: Secondary | ICD-10-CM

## 2016-07-19 DIAGNOSIS — M25661 Stiffness of right knee, not elsewhere classified: Secondary | ICD-10-CM

## 2016-07-19 DIAGNOSIS — R262 Difficulty in walking, not elsewhere classified: Secondary | ICD-10-CM

## 2016-07-19 NOTE — Therapy (Signed)
[place], [place], [address] Phone: [phone]   Fax:  [phone]  Physical Therapy Treatment  Patient Details  Name: Stanley Kelly MRN: [id number] Date of Birth: 05/29/1951 Referring Provider: Dorna Leitz  Encounter Date: 07/19/2016      PT End of Session - 07/19/16 1006    Visit Number 5   Number of Visits 24   Date for PT Re-Evaluation 08/06/16   Authorization Type [employer]   Authorization - Visit Number 5   Authorization - Number of Visits 10   PT Start Time 0902   PT Stop Time 0945   PT Time Calculation (min) 43 min   Activity Tolerance Patient tolerated treatment well   Behavior During Therapy [facility] for tasks assessed/performed      Past Medical History:  Diagnosis Date  . Arthritis   . Hx of knee surgery     Past Surgical History:  Procedure Laterality Date  . INJECTION KNEE Bilateral 08/18/2015   Procedure: BILATERAL TOTAL KNEE BILATERAL CORTISONE INJECTION;  Surgeon: Dorna Leitz, MD;  Location: [place];  Service: Orthopedics;  Laterality: Bilateral;  . JOINT REPLACEMENT    . KNEE SURGERY    . TOTAL HIP ARTHROPLASTY Left 08/18/2015   Procedure: TOTAL HIP ARTHROPLASTY ANTERIOR APPROACH;  Surgeon: Dorna Leitz, MD;  Location: [place];  Service: Orthopedics;  Laterality: Left;  . TOTAL KNEE ARTHROPLASTY Right 07/05/2016   Procedure: RIGHT TOTAL KNEE ARTHROPLASTY;  Surgeon: Dorna Leitz, MD;  Location: [place];  Service: Orthopedics;  Laterality: Right;  Marland Kitchen VASECTOMY  1985    There were no vitals filed for this visit.      Subjective Assessment - 07/19/16 0906    Subjective pt states he returns to Md tomorrow to get his staples removed.  STates he's having more problems at night sleeping and first thing in the morning due to stiffness.  Pt is walking indoors at this home using his cane.    Currently in Pain? Yes   Pain Score 2    Pain Location Knee   Pain Orientation Right   Pain Descriptors / Indicators  Tightness                         [facility] Adult PT Treatment/Exercise - 07/19/16 0001      Knee/Hip Exercises: Stretches   Knee: Self-Stretch to increase Flexion Right;10 seconds   Knee: Self-Stretch Limitations 10 reps     Knee/Hip Exercises: Standing   Heel Raises 15 reps   Knee Flexion Right;10 reps   Gait Training with SPC 125 feet      Knee/Hip Exercises: Seated   Long Arc Quad 15 reps     Knee/Hip Exercises: Supine   Quad Sets 15 reps   Short Arc Quad Sets 15 reps   Heel Slides 15 reps   Straight Leg Raises 15 reps   Knee Extension Limitations 5   Knee Flexion Limitations 80                  PT Short Term Goals - 07/16/16 0944      PT SHORT TERM GOAL #1   Title Pt to be walking in his home with a cane   Time 2   Period Weeks   Status On-going     PT SHORT TERM GOAL #2   Title Pt ROM in Right knee to be five or less degrees to allow pt to ambulate  with a normal gait pattern    Time 2   Period Weeks   Status On-going     PT SHORT TERM GOAL #3   Title Pt pain in his right knee to be no greater than a 2/10 with medication to allow pt to be weight bearing for 30 minutes straight without resting    Time 4   Period Weeks   Status On-going     PT SHORT TERM GOAL #4   Title Pt flexion of his right knee to be to 105 to allow patient to ascend and descend steps with ease   Time 4   Period Weeks   Status On-going           PT Long Term Goals - 07/16/16 0945      PT LONG TERM GOAL #1   Title Pt to be walking without a cane inside and with a cane on uneven terrain.    Time 6   Period Weeks   Status Not Met     PT LONG TERM GOAL #2   Title Pt Rt knee rom to be to 120 to allow pt to squat down and pick items off the ground with ease.   Time 8   Period Weeks   Status Not Met     PT LONG TERM GOAL #3   Title Pt SLS on both legs to be up to 30 seconds to allow pt to feel confident walking on uneven terrain without an assistive  device.    Time 8   Period Weeks   Status Not Met     PT LONG TERM GOAL #4   Title Pt to be able to tolerate being weight bearing for an hour and a half without increased pain in his right knee   Time 8   Period Weeks   Status Not Met               Plan - 07/19/16 1007    Clinical Impression Statement Primary focus continues to be Rt knee ROM.  Pt with improved ROM today 5-80 (was 7-75 last session).  Pt reports to working hard on his ROM at home with most discomfort during the night and mornings.  Overall pain reduction since decreasing his walking at home ot 10 minute bouts, 2X day.  No adhesions or fascial tightness around knee, however continues to be swollen restricting ROM.    Encouraged continued focus on ROM at home with strength progression as it improves to more functional range.    Rehab Potential Good   Clinical Impairments Affecting Rehab Potential Painful Lt knee; having TKA on Lt knee first week in September   PT Frequency 3x / week   PT Duration 8 weeks   PT Treatment/Interventions ADLs/Self Care Home Management;Cryotherapy;Electrical Stimulation;Moist Heat;Gait training;Stair training;Functional mobility training;Therapeutic activities;Therapeutic exercise;Balance training;Neuromuscular re-education;Patient/family education;Manual techniques;Passive range of motion   PT Next Visit Plan edema management techniques; review and update HEP with knee ROM therex (if not already provided); address knee flexion/ext ROM; gait training in // bars to improve posture      Patient will benefit from skilled therapeutic intervention in order to improve the following deficits and impairments:  Abnormal gait, Decreased activity tolerance, Decreased balance, Decreased endurance, Decreased range of motion, Decreased strength, Difficulty walking, Increased edema, Impaired flexibility, Pain  Visit Diagnosis: Pain in right knee  Difficulty in walking, not elsewhere  classified  Unsteadiness on feet  Stiffness of right knee, not elsewhere classified  Problem List Patient Active Problem List   Diagnosis Date Noted  . S/P knee replacement 07/05/2016  . Primary osteoarthritis of left hip 08/18/2015  . Primary osteoarthritis of right knee 08/18/2015  . Primary osteoarthritis of left knee 08/18/2015  . History of osteoarthritis 10/07/2014  . Overweight(278.02) 05/16/2012    Teena Irani, PTA/CLT [phone]  07/19/2016, 10:12 AM  [facility] [address] [place], [place], [address] Phone: [phone]   Fax:  [phone]  Name: Stanley Kelly MRN: [id number] Date of Birth: 1951/08/30

## 2016-07-21 ENCOUNTER — Ambulatory Visit (HOSPITAL_COMMUNITY): Payer: Managed Care, Other (non HMO) | Admitting: Physical Therapy

## 2016-07-21 DIAGNOSIS — M25561 Pain in right knee: Secondary | ICD-10-CM | POA: Diagnosis not present

## 2016-07-21 DIAGNOSIS — M25661 Stiffness of right knee, not elsewhere classified: Secondary | ICD-10-CM

## 2016-07-21 DIAGNOSIS — R262 Difficulty in walking, not elsewhere classified: Secondary | ICD-10-CM

## 2016-07-21 DIAGNOSIS — R2681 Unsteadiness on feet: Secondary | ICD-10-CM

## 2016-07-21 NOTE — Therapy (Signed)
[place], [place], [address] Phone: [phone]   Fax:  [phone]  Physical Therapy Treatment  Patient Details  Name: Stanley Kelly MRN: [id number] Date of Birth: 27-Aug-1951 Referring Provider: Dorna Leitz  Encounter Date: 07/21/2016      PT End of Session - 07/21/16 1229    Visit Number 6   Number of Visits 24   Date for PT Re-Evaluation 08/06/16   Authorization Type [employer]   Authorization - Visit Number 6   Authorization - Number of Visits 10   PT Start Time 0900   PT Stop Time 0945   PT Time Calculation (min) 45 min   Activity Tolerance Patient tolerated treatment well   Behavior During Therapy [facility] for tasks assessed/performed      Past Medical History:  Diagnosis Date  . Arthritis   . Hx of knee surgery     Past Surgical History:  Procedure Laterality Date  . INJECTION KNEE Bilateral 08/18/2015   Procedure: BILATERAL TOTAL KNEE BILATERAL CORTISONE INJECTION;  Surgeon: Dorna Leitz, MD;  Location: [place];  Service: Orthopedics;  Laterality: Bilateral;  . JOINT REPLACEMENT    . KNEE SURGERY    . TOTAL HIP ARTHROPLASTY Left 08/18/2015   Procedure: TOTAL HIP ARTHROPLASTY ANTERIOR APPROACH;  Surgeon: Dorna Leitz, MD;  Location: [place];  Service: Orthopedics;  Laterality: Left;  . TOTAL KNEE ARTHROPLASTY Right 07/05/2016   Procedure: RIGHT TOTAL KNEE ARTHROPLASTY;  Surgeon: Dorna Leitz, MD;  Location: [place];  Service: Orthopedics;  Laterality: Right;  Marland Kitchen VASECTOMY  1985    There were no vitals filed for this visit.      Subjective Assessment - 07/21/16 0918    Subjective Pt states MD was pleased.  States he had no staples,only sutures.  Reports he is "rocking" on his bike at home.  Knee feels much looser now that bandage is gone.   Still sceduled to have Lt knee replaced in September.   Pt reports no pain, only stiffness and is not taking pain pills, only muscle relaxer   Currently in Pain? No/denies                          [facility] Adult PT Treatment/Exercise - 07/21/16 0001      Knee/Hip Exercises: Stretches   Knee: Self-Stretch to increase Flexion Right;10 seconds   Knee: Self-Stretch Limitations 10 reps   Gastroc Stretch Both;3 reps;30 seconds   Gastroc Stretch Limitations slant board     Knee/Hip Exercises: Standing   Heel Raises 15 reps   Knee Flexion Right;10 reps     Knee/Hip Exercises: Seated   Long Arc Quad 15 reps   Heel Slides 5 reps;Limitations   Heel Slides Limitations seated EOB stretch using opposite LE to stretch 10" holds     Knee/Hip Exercises: Supine   Quad Sets 15 reps   Short Arc Quad Sets 15 reps   Heel Slides 15 reps   Straight Leg Raises 15 reps   Knee Extension Limitations 5   Knee Flexion Limitations 80     Manual Therapy   Manual Therapy Soft tissue mobilization;Myofascial release   Manual therapy comments supine completed at EOS seperate from all other skilled interventions   Soft tissue mobilization scar tissue    Myofascial Release adhesions perimeter of knee to increase ROM                  PT  Short Term Goals - 07/16/16 0944      PT SHORT TERM GOAL #1   Title Pt to be walking in his home with a cane   Time 2   Period Weeks   Status On-going     PT SHORT TERM GOAL #2   Title Pt ROM in Right knee to be five or less degrees to allow pt to ambulate with a normal gait pattern    Time 2   Period Weeks   Status On-going     PT SHORT TERM GOAL #3   Title Pt pain in his right knee to be no greater than a 2/10 with medication to allow pt to be weight bearing for 30 minutes straight without resting    Time 4   Period Weeks   Status On-going     PT SHORT TERM GOAL #4   Title Pt flexion of his right knee to be to 105 to allow patient to ascend and descend steps with ease   Time 4   Period Weeks   Status On-going           PT Long Term Goals - 07/16/16 0945      PT LONG TERM GOAL #1   Title Pt to be  walking without a cane inside and with a cane on uneven terrain.    Time 6   Period Weeks   Status Not Met     PT LONG TERM GOAL #2   Title Pt Rt knee rom to be to 120 to allow pt to squat down and pick items off the ground with ease.   Time 8   Period Weeks   Status Not Met     PT LONG TERM GOAL #3   Title Pt SLS on both legs to be up to 30 seconds to allow pt to feel confident walking on uneven terrain without an assistive device.    Time 8   Period Weeks   Status Not Met     PT LONG TERM GOAL #4   Title Pt to be able to tolerate being weight bearing for an hour and a half without increased pain in his right knee   Time 8   Period Weeks   Status Not Met               Plan - 07/21/16 1230    Clinical Impression Statement cotinued focus on Rt knee ROM.   Scar now exposed with noted tightness throughout.  Edcuated pateint with scar massage and completed to loosen adhesiions.  Also completed myofascial techniques to perimeter of knee to help increase ROM.  Encouraged pateint to complete these techniques at home as well as continue rocking on his bike and prone extension.  Pt verbalized understanding.    Rehab Potential Good   Clinical Impairments Affecting Rehab Potential Painful Lt knee; having TKA on Lt knee first week in September   PT Frequency 3x / week   PT Duration 8 weeks   PT Treatment/Interventions ADLs/Self Care Home Management;Cryotherapy;Electrical Stimulation;Moist Heat;Gait training;Stair training;Functional mobility training;Therapeutic activities;Therapeutic exercise;Balance training;Neuromuscular re-education;Patient/family education;Manual techniques;Passive range of motion   PT Next Visit Plan continue with primary focus on ROM.  continue manual to improve tightness and ROM      Patient will benefit from skilled therapeutic intervention in order to improve the following deficits and impairments:  Abnormal gait, Decreased activity tolerance, Decreased  balance, Decreased endurance, Decreased range of motion, Decreased strength, Difficulty walking, Increased edema, Impaired  flexibility, Pain  Visit Diagnosis: Pain in right knee  Difficulty in walking, not elsewhere classified  Unsteadiness on feet  Stiffness of right knee, not elsewhere classified     Problem List Patient Active Problem List   Diagnosis Date Noted  . S/P knee replacement 07/05/2016  . Primary osteoarthritis of left hip 08/18/2015  . Primary osteoarthritis of right knee 08/18/2015  . Primary osteoarthritis of left knee 08/18/2015  . History of osteoarthritis 10/07/2014  . Overweight(278.02) 05/16/2012    Teena Irani, PTA/CLT [phone]  07/21/2016, 12:33 PM  [facility] [address] [place], [place], [address] Phone: [phone]   Fax:  [phone]  Name: Stanley Kelly MRN: [id number] Date of Birth: 1951/04/20

## 2016-07-23 ENCOUNTER — Ambulatory Visit (HOSPITAL_COMMUNITY): Payer: Managed Care, Other (non HMO) | Admitting: Physical Therapy

## 2016-07-23 DIAGNOSIS — M25661 Stiffness of right knee, not elsewhere classified: Secondary | ICD-10-CM

## 2016-07-23 DIAGNOSIS — M25561 Pain in right knee: Secondary | ICD-10-CM

## 2016-07-23 DIAGNOSIS — R262 Difficulty in walking, not elsewhere classified: Secondary | ICD-10-CM

## 2016-07-23 DIAGNOSIS — R2681 Unsteadiness on feet: Secondary | ICD-10-CM

## 2016-07-23 NOTE — Therapy (Signed)
[place], [place], [address] Phone: [phone]   Fax:  [phone]  Physical Therapy Treatment  Patient Details  Name: Stanley Kelly MRN: [id number] Date of Birth: [birth year] Referring Provider: Dorna Leitz  Encounter Date: 07/23/2016      PT End of Session - 07/23/16 0945    Visit Number 7   Number of Visits 24   Date for PT Re-Evaluation 08/06/16   Authorization Type [employer]   Authorization - Visit Number 7   Authorization - Number of Visits 10   PT Start Time 0903   PT Stop Time 0947   PT Time Calculation (min) 44 min   Activity Tolerance Patient tolerated treatment well   Behavior During Therapy [facility] for tasks assessed/performed      Past Medical History:  Diagnosis Date  . Arthritis   . Hx of knee surgery     Past Surgical History:  Procedure Laterality Date  . INJECTION KNEE Bilateral 08/18/2015   Procedure: BILATERAL TOTAL KNEE BILATERAL CORTISONE INJECTION;  Surgeon: Dorna Leitz, MD;  Location: [place];  Service: Orthopedics;  Laterality: Bilateral;  . JOINT REPLACEMENT    . KNEE SURGERY    . TOTAL HIP ARTHROPLASTY Left 08/18/2015   Procedure: TOTAL HIP ARTHROPLASTY ANTERIOR APPROACH;  Surgeon: Dorna Leitz, MD;  Location: [place];  Service: Orthopedics;  Laterality: Left;  . TOTAL KNEE ARTHROPLASTY Right 07/05/2016   Procedure: RIGHT TOTAL KNEE ARTHROPLASTY;  Surgeon: Dorna Leitz, MD;  Location: [place];  Service: Orthopedics;  Laterality: Right;  Marland Kitchen VASECTOMY  1985    There were no vitals filed for this visit.      Subjective Assessment - 07/23/16 0903    Subjective Pt states that his Lt knee is giving him more pain than his right knee. He is going without his cane as much as possible.    Pertinent History Lt THR;    Currently in Pain? Yes   Pain Score 4    Pain Location Knee   Pain Orientation Right   Pain Onset Today                         [facility] Adult PT Treatment/Exercise  - 07/23/16 0001      Knee/Hip Exercises: Stretches   Sports administrator Right;3 reps;30 seconds   Knee: Self-Stretch to increase Flexion Right;10 seconds   Knee: Self-Stretch Limitations 10 reps   Gastroc Stretch Both;3 reps;30 seconds   Gastroc Stretch Limitations slant board     Knee/Hip Exercises: Standing   Heel Raises 15 reps   Lateral Step Up Right;10 reps   Forward Step Up Right;10 reps;Hand Hold: 1;Step Height: 4"   Step Down Right;10 reps;Step Height: 2"   Rocker Board 2 minutes   SLS Rt only      Knee/Hip Exercises: Supine   Quad Sets 15 reps   Short Arc Quad Sets 15 reps   Heel Slides 15 reps   Knee Extension Limitations 3   Knee Flexion Limitations 86     Knee/Hip Exercises: Prone   Hamstring Curl 15 reps   Contract/Relax to Increase Flexion x6     Manual Therapy   Manual Therapy Edema management;Soft tissue mobilization   Manual therapy comments supine completed at EOS seperate from all other skilled interventions   Soft tissue mobilization decongestive manual techniqes                 PT Education -  07/23/16 0945    Education provided Yes   Education Details The importance of pushing flexion; pt to ice for 10 minutes 6 times a day   Person(s) Educated Patient   Methods Explanation   Comprehension Verbalized understanding          PT Short Term Goals - 07/23/16 0948      PT SHORT TERM GOAL #1   Title Pt to be walking in his home with a cane   Time 2   Period Weeks   Status Achieved     PT SHORT TERM GOAL #2   Title Pt ROM in Right knee to be five or less degrees to allow pt to ambulate with a normal gait pattern    Time 2   Period Weeks   Status Achieved     PT SHORT TERM GOAL #3   Title Pt pain in his right knee to be no greater than a 2/10 with medication to allow pt to be weight bearing for 30 minutes straight without resting    Time 4   Period Weeks   Status On-going     PT SHORT TERM GOAL #4   Title Pt flexion of his right knee to  be to 105 to allow patient to ascend and descend steps with ease   Time 4   Period Weeks   Status On-going           PT Long Term Goals - 07/23/16 0949      PT LONG TERM GOAL #1   Title Pt to be walking without a cane inside and with a cane on uneven terrain.    Time 6   Period Weeks   Status Achieved     PT LONG TERM GOAL #2   Title Pt Rt knee rom to be to 120 to allow pt to squat down and pick items off the ground with ease.   Time 8   Period Weeks   Status Not Met     PT LONG TERM GOAL #3   Title Pt SLS on both legs to be up to 30 seconds to allow pt to feel confident walking on uneven terrain without an assistive device.    Time 8   Period Weeks   Status Not Met     PT LONG TERM GOAL #4   Title Pt to be able to tolerate being weight bearing for an hour and a half without increased pain in his right knee   Time 8   Period Weeks   Status Not Met               Plan - 07/23/16 0946    Clinical Impression Statement Pt encouraged to push flexion at home by therapist.  Pt ROM to 86 degrees with verbal encouragement from therapist not to stop.  Began step ups and step downs as hills are difficult for pt to manuever.  Pt iced the last 10 minutes of treatment this was not counted in time or charged for as the therapist had patient take ice off and leave on his own.    Rehab Potential Good   Clinical Impairments Affecting Rehab Potential Painful Lt knee; having TKA on Lt knee first week in September   PT Frequency 3x / week   PT Duration 8 weeks   PT Treatment/Interventions ADLs/Self Care Home Management;Cryotherapy;Electrical Stimulation;Moist Heat;Gait training;Stair training;Functional mobility training;Therapeutic activities;Therapeutic exercise;Balance training;Neuromuscular re-education;Patient/family education;Manual techniques;Passive range of motion   PT Next Visit Plan continue  with primary focus on flexion..  Continue manual to improve tightness and ROM       Patient will benefit from skilled therapeutic intervention in order to improve the following deficits and impairments:  Abnormal gait, Decreased activity tolerance, Decreased balance, Decreased endurance, Decreased range of motion, Decreased strength, Difficulty walking, Increased edema, Impaired flexibility, Pain  Visit Diagnosis: Pain in right knee  Difficulty in walking, not elsewhere classified  Unsteadiness on feet  Stiffness of right knee, not elsewhere classified     Problem List Patient Active Problem List   Diagnosis Date Noted  . S/P knee replacement 07/05/2016  . Primary osteoarthritis of left hip 08/18/2015  . Primary osteoarthritis of right knee 08/18/2015  . Primary osteoarthritis of left knee 08/18/2015  . History of osteoarthritis 10/07/2014  . Overweight(278.02) 05/16/2012    Rayetta Humphrey, PT CLT [phone] 07/23/2016, 9:50 AM  [facility] [address] [place], [place], [address] Phone: [phone]   Fax:  [phone]  Name: TAHSIN BENYO MRN: [id number] Date of Birth: 1951/09/15

## 2016-07-26 ENCOUNTER — Ambulatory Visit (HOSPITAL_COMMUNITY): Payer: Managed Care, Other (non HMO) | Admitting: Physical Therapy

## 2016-07-26 DIAGNOSIS — R262 Difficulty in walking, not elsewhere classified: Secondary | ICD-10-CM

## 2016-07-26 DIAGNOSIS — M25561 Pain in right knee: Secondary | ICD-10-CM | POA: Diagnosis not present

## 2016-07-26 DIAGNOSIS — R2681 Unsteadiness on feet: Secondary | ICD-10-CM

## 2016-07-26 DIAGNOSIS — M25661 Stiffness of right knee, not elsewhere classified: Secondary | ICD-10-CM

## 2016-07-26 NOTE — Therapy (Signed)
[place], [place], [address] Phone: [phone]   Fax:  [phone]  Physical Therapy Treatment  Patient Details  Name: Stanley Kelly MRN: [id number] Date of Birth: Jan 12, 1951 Referring Provider: Dorna Leitz  Encounter Date: 07/26/2016      PT End of Session - 07/26/16 1741    Visit Number 8   Number of Visits 24   Date for PT Re-Evaluation 08/06/16   Authorization Type [employer]   Authorization - Visit Number 8   Authorization - Number of Visits 10   PT Start Time 0905   PT Stop Time 0948   PT Time Calculation (min) 43 min   Activity Tolerance Patient tolerated treatment well   Behavior During Therapy [facility] for tasks assessed/performed      Past Medical History:  Diagnosis Date  . Arthritis   . Hx of knee surgery     Past Surgical History:  Procedure Laterality Date  . INJECTION KNEE Bilateral 08/18/2015   Procedure: BILATERAL TOTAL KNEE BILATERAL CORTISONE INJECTION;  Surgeon: Dorna Leitz, MD;  Location: [place];  Service: Orthopedics;  Laterality: Bilateral;  . JOINT REPLACEMENT    . KNEE SURGERY    . TOTAL HIP ARTHROPLASTY Left 08/18/2015   Procedure: TOTAL HIP ARTHROPLASTY ANTERIOR APPROACH;  Surgeon: Dorna Leitz, MD;  Location: [place];  Service: Orthopedics;  Laterality: Left;  . TOTAL KNEE ARTHROPLASTY Right 07/05/2016   Procedure: RIGHT TOTAL KNEE ARTHROPLASTY;  Surgeon: Dorna Leitz, MD;  Location: [place];  Service: Orthopedics;  Laterality: Right;  Marland Kitchen VASECTOMY  1985    There were no vitals filed for this visit.      Subjective Assessment - 07/26/16 1736    Subjective Pt states he can go all the way around on his bike but believes he was using his hip alot as it is sore today.                          [place] Adult PT Treatment/Exercise - 07/26/16 1736      Knee/Hip Exercises: Stretches   Active Hamstring Stretch Right;3 reps;30 seconds   Active Hamstring Stretch Limitations 12" box    Knee: Self-Stretch to increase Flexion Right;10 seconds   Knee: Self-Stretch Limitations 10 reps   Gastroc Stretch Both;3 reps;30 seconds   Gastroc Stretch Limitations slant board     Knee/Hip Exercises: Standing   Heel Raises 15 reps     Knee/Hip Exercises: Supine   Quad Sets 15 reps   Short Arc Quad Sets 15 reps   Heel Slides 15 reps   Knee Extension AROM;PROM   Knee Extension Limitations 3   Knee Flexion AROM;PROM   Knee Flexion Limitations 90     Manual Therapy   Manual Therapy Soft tissue mobilization;Myofascial release   Manual therapy comments supine completed at EOS seperate from all other skilled interventions   Soft tissue mobilization scar tissue mobilization/instruction   Myofascial Release adhesions perimeter of knee to increase ROM                  PT Short Term Goals - 07/23/16 0948      PT SHORT TERM GOAL #1   Title Pt to be walking in his home with a cane   Time 2   Period Weeks   Status Achieved     PT SHORT TERM GOAL #2   Title Pt ROM in Right knee to be five or  less degrees to allow pt to ambulate with a normal gait pattern    Time 2   Period Weeks   Status Achieved     PT SHORT TERM GOAL #3   Title Pt pain in his right knee to be no greater than a 2/10 with medication to allow pt to be weight bearing for 30 minutes straight without resting    Time 4   Period Weeks   Status On-going     PT SHORT TERM GOAL #4   Title Pt flexion of his right knee to be to 105 to allow patient to ascend and descend steps with ease   Time 4   Period Weeks   Status On-going           PT Long Term Goals - 07/23/16 0949      PT LONG TERM GOAL #1   Title Pt to be walking without a cane inside and with a cane on uneven terrain.    Time 6   Period Weeks   Status Achieved     PT LONG TERM GOAL #2   Title Pt Rt knee rom to be to 120 to allow pt to squat down and pick items off the ground with ease.   Time 8   Period Weeks   Status Not Met      PT LONG TERM GOAL #3   Title Pt SLS on both legs to be up to 30 seconds to allow pt to feel confident walking on uneven terrain without an assistive device.    Time 8   Period Weeks   Status Not Met     PT LONG TERM GOAL #4   Title Pt to be able to tolerate being weight bearing for an hour and a half without increased pain in his right knee   Time 8   Period Weeks   Status Not Met               Plan - 07/26/16 1742    Clinical Impression Statement Continued focus on increasing ROM in Rt knee.  Worked on mobilization of scar tissue and adhesions prior to PROM of joint.  Able to achieve 90 degrees flexion this session following.  Pt reported no increased pain or symtoms at EOS. Encouraged patient to complete scar massage at home.    Rehab Potential Good   Clinical Impairments Affecting Rehab Potential Painful Lt knee; having TKA on Lt knee first week in September   PT Frequency 3x / week   PT Duration 8 weeks   PT Treatment/Interventions ADLs/Self Care Home Management;Cryotherapy;Electrical Stimulation;Moist Heat;Gait training;Stair training;Functional mobility training;Therapeutic activities;Therapeutic exercise;Balance training;Neuromuscular re-education;Patient/family education;Manual techniques;Passive range of motion   PT Next Visit Plan continue with primary focus on flexion..  Continue manual to improve tightness and ROM      Patient will benefit from skilled therapeutic intervention in order to improve the following deficits and impairments:  Abnormal gait, Decreased activity tolerance, Decreased balance, Decreased endurance, Decreased range of motion, Decreased strength, Difficulty walking, Increased edema, Impaired flexibility, Pain  Visit Diagnosis: Pain in right knee  Difficulty in walking, not elsewhere classified  Unsteadiness on feet  Stiffness of right knee, not elsewhere classified     Problem List Patient Active Problem List   Diagnosis Date Noted  .  S/P knee replacement 07/05/2016  . Primary osteoarthritis of left hip 08/18/2015  . Primary osteoarthritis of right knee 08/18/2015  . Primary osteoarthritis of left knee 08/18/2015  . History  of osteoarthritis 10/07/2014  . Overweight(278.02) 05/16/2012    Teena Irani, PTA/CLT [phone]  07/26/2016, 5:46 PM  [facility] [address] [place], [place], [address] Phone: [phone]   Fax:  [phone]  Name: CLEAVE TERNES MRN: [id number] Date of Birth: 1951/10/01

## 2016-07-28 ENCOUNTER — Ambulatory Visit (HOSPITAL_COMMUNITY): Payer: Managed Care, Other (non HMO) | Admitting: Physical Therapy

## 2016-07-28 DIAGNOSIS — R2681 Unsteadiness on feet: Secondary | ICD-10-CM

## 2016-07-28 DIAGNOSIS — R262 Difficulty in walking, not elsewhere classified: Secondary | ICD-10-CM

## 2016-07-28 DIAGNOSIS — M25661 Stiffness of right knee, not elsewhere classified: Secondary | ICD-10-CM

## 2016-07-28 DIAGNOSIS — M25561 Pain in right knee: Secondary | ICD-10-CM

## 2016-07-28 NOTE — Therapy (Signed)
[place], [place], [address] Phone: [phone]   Fax:  [phone]  Physical Therapy Treatment  Patient Details  Name: Stanley Kelly MRN: [id number] Date of Birth: 28-Nov-1951 Referring Provider: Dorna Leitz  Encounter Date: 07/28/2016      PT End of Session - 07/28/16 0953    Visit Number 9   Number of Visits 24   Date for PT Re-Evaluation 08/06/16   Authorization Type [employer]   Authorization - Visit Number 9   Authorization - Number of Visits 10   PT Start Time 0900   PT Stop Time 0950   PT Time Calculation (min) 50 min   Activity Tolerance Patient tolerated treatment well   Behavior During Therapy [facility] for tasks assessed/performed      Past Medical History:  Diagnosis Date  . Arthritis   . Hx of knee surgery     Past Surgical History:  Procedure Laterality Date  . INJECTION KNEE Bilateral 08/18/2015   Procedure: BILATERAL TOTAL KNEE BILATERAL CORTISONE INJECTION;  Surgeon: Dorna Leitz, MD;  Location: [place];  Service: Orthopedics;  Laterality: Bilateral;  . JOINT REPLACEMENT    . KNEE SURGERY    . TOTAL HIP ARTHROPLASTY Left 08/18/2015   Procedure: TOTAL HIP ARTHROPLASTY ANTERIOR APPROACH;  Surgeon: Dorna Leitz, MD;  Location: [place];  Service: Orthopedics;  Laterality: Left;  . TOTAL KNEE ARTHROPLASTY Right 07/05/2016   Procedure: RIGHT TOTAL KNEE ARTHROPLASTY;  Surgeon: Dorna Leitz, MD;  Location: [place];  Service: Orthopedics;  Laterality: Right;  Marland Kitchen VASECTOMY  1985    There were no vitals filed for this visit.      Subjective Assessment - 07/28/16 0903    Subjective Pt states he is using his cane this morning for his Lt LE.  States he has been working on bending his Rt knee but just cant get it bending far.  Pt without pain in his Rt knee this morning.    Currently in Pain? No/denies                         [facility] Adult PT Treatment/Exercise - 07/28/16 0001      Knee/Hip  Exercises: Stretches   Active Hamstring Stretch Right;3 reps;30 seconds   Active Hamstring Stretch Limitations 12" box   Knee: Self-Stretch to increase Flexion Right;10 seconds   Knee: Self-Stretch Limitations 10 reps   Gastroc Stretch Both;3 reps;30 seconds   Gastroc Stretch Limitations slant board     Knee/Hip Exercises: Standing   Knee Flexion Right;3 sets;15 reps   Knee Flexion Limitations TKF with 4" box     Knee/Hip Exercises: Supine   Quad Sets 15 reps   Short Arc Quad Sets 15 reps   Heel Slides 15 reps   Heel Slides Limitations AAROM/PROM at end range 10X achieving 98 degrees   Knee Extension AROM;PROM   Knee Extension Limitations 3   Knee Flexion AROM;PROM   Knee Flexion Limitations 90, PROM to 98     Manual Therapy   Manual Therapy Soft tissue mobilization;Myofascial release   Manual therapy comments supine completed at EOS seperate from all other skilled interventions   Soft tissue mobilization scar tissue mobilization/instruction   Myofascial Release adhesions perimeter of knee to increase ROM                  PT Short Term Goals - 07/23/16 0948      PT SHORT TERM  GOAL #1   Title Pt to be walking in his home with a cane   Time 2   Period Weeks   Status Achieved     PT SHORT TERM GOAL #2   Title Pt ROM in Right knee to be five or less degrees to allow pt to ambulate with a normal gait pattern    Time 2   Period Weeks   Status Achieved     PT SHORT TERM GOAL #3   Title Pt pain in his right knee to be no greater than a 2/10 with medication to allow pt to be weight bearing for 30 minutes straight without resting    Time 4   Period Weeks   Status On-going     PT SHORT TERM GOAL #4   Title Pt flexion of his right knee to be to 105 to allow patient to ascend and descend steps with ease   Time 4   Period Weeks   Status On-going           PT Long Term Goals - 07/23/16 0949      PT LONG TERM GOAL #1   Title Pt to be walking without a cane  inside and with a cane on uneven terrain.    Time 6   Period Weeks   Status Achieved     PT LONG TERM GOAL #2   Title Pt Rt knee rom to be to 120 to allow pt to squat down and pick items off the ground with ease.   Time 8   Period Weeks   Status Not Met     PT LONG TERM GOAL #3   Title Pt SLS on both legs to be up to 30 seconds to allow pt to feel confident walking on uneven terrain without an assistive device.    Time 8   Period Weeks   Status Not Met     PT LONG TERM GOAL #4   Title Pt to be able to tolerate being weight bearing for an hour and a half without increased pain in his right knee   Time 8   Period Weeks   Status Not Met               Plan - 07/28/16 0953    Clinical Impression Statement Focused alot on manual techniques to decrease adhesions and improve ROM.  Able to achieve 98 degrees flexion with AAROM.  Added terminal knee flexion in standing.  Pt reported no increased pain at EOS.  Continues to work diligently on his ROM at home.     Rehab Potential Good   Clinical Impairments Affecting Rehab Potential Painful Lt knee; having TKA on Lt knee first week in September   PT Frequency 3x / week   PT Duration 8 weeks   PT Treatment/Interventions ADLs/Self Care Home Management;Cryotherapy;Electrical Stimulation;Moist Heat;Gait training;Stair training;Functional mobility training;Therapeutic activities;Therapeutic exercise;Balance training;Neuromuscular re-education;Patient/family education;Manual techniques;Passive range of motion   PT Next Visit Plan continue with primary focus on flexion..  Continue manual to improve tightness and ROM      Patient will benefit from skilled therapeutic intervention in order to improve the following deficits and impairments:  Abnormal gait, Decreased activity tolerance, Decreased balance, Decreased endurance, Decreased range of motion, Decreased strength, Difficulty walking, Increased edema, Impaired flexibility, Pain  Visit  Diagnosis: Pain in right knee  Difficulty in walking, not elsewhere classified  Unsteadiness on feet  Stiffness of right knee, not elsewhere classified  Problem List Patient Active Problem List   Diagnosis Date Noted  . S/P knee replacement 07/05/2016  . Primary osteoarthritis of left hip 08/18/2015  . Primary osteoarthritis of right knee 08/18/2015  . Primary osteoarthritis of left knee 08/18/2015  . History of osteoarthritis 10/07/2014  . Overweight(278.02) 05/16/2012    Teena Irani, PTA/CLT [phone]  07/28/2016, 9:59 AM  [facility] [address] [place], [place], [address] Phone: [phone]   Fax:  [phone]  Name: Stanley Kelly MRN: [id number] Date of Birth: 03/25/1951

## 2016-07-29 ENCOUNTER — Telehealth (HOSPITAL_COMMUNITY): Payer: Self-pay | Admitting: Physical Therapy

## 2016-07-29 NOTE — Telephone Encounter (Signed)
mothers car broke down and she can not come in 8/25 at 9:45 he will see Clarise Cruz. NF

## 2016-07-30 ENCOUNTER — Ambulatory Visit (HOSPITAL_COMMUNITY): Payer: Managed Care, Other (non HMO)

## 2016-07-30 ENCOUNTER — Other Ambulatory Visit: Payer: Self-pay | Admitting: Orthopedic Surgery

## 2016-07-30 DIAGNOSIS — M25561 Pain in right knee: Secondary | ICD-10-CM

## 2016-07-30 DIAGNOSIS — R262 Difficulty in walking, not elsewhere classified: Secondary | ICD-10-CM

## 2016-07-30 DIAGNOSIS — R2681 Unsteadiness on feet: Secondary | ICD-10-CM

## 2016-07-30 DIAGNOSIS — M25661 Stiffness of right knee, not elsewhere classified: Secondary | ICD-10-CM

## 2016-07-30 NOTE — Therapy (Signed)
[facility] [address] [place], [place], [address] Phone: [phone]   Fax:  [phone]  Physical Therapy Treatment  Patient Details  Name: Stanley Kelly MRN: [id number] Date of Birth: 06/02/1951 Referring Provider: John Graves  Encounter Date: 07/30/2016      PT End of Session - 07/30/16 1030    Visit Number 10   Number of Visits 24   Date for PT Re-Evaluation 08/06/16   Authorization Type [employer]   Authorization - Visit Number 10   Authorization - Number of Visits 10   PT Start Time 0947   PT Stop Time 1036   PT Time Calculation (min) 49 min   Activity Tolerance Patient tolerated treatment well   Behavior During Therapy [facility] for tasks assessed/performed      Past Medical History:  Diagnosis Date  . Arthritis   . Hx of knee surgery     Past Surgical History:  Procedure Laterality Date  . INJECTION KNEE Bilateral 08/18/2015   Procedure: BILATERAL TOTAL KNEE BILATERAL CORTISONE INJECTION;  Surgeon: John Graves, MD;  [facility];  Service: Orthopedics;  Laterality: Bilateral;  . JOINT REPLACEMENT    . KNEE SURGERY    . TOTAL HIP ARTHROPLASTY Left 08/18/2015   Procedure: TOTAL HIP ARTHROPLASTY ANTERIOR APPROACH;  Surgeon: John Graves, MD;  Location[facility];  Service: Orthopedics;  Laterality: Left;  . TOTAL KNEE ARTHROPLASTY Right 07/05/2016   Procedure: RIGHT TOTAL KNEE ARTHROPLASTY;  Surgeon: John Graves, MD;  Location[facility];  Service: Orthopedics;  Laterality: Right;  . VASECTOMY  1985    There were no vitals filed for this visit.      Subjective Assessment - 07/30/16 0949    Subjective Pt stated his pain is minimal just really tight today.     Pertinent History Lt THR;    Currently in Pain? Yes   Pain Score 1    Pain Location Knee   Pain Orientation Right   Pain Descriptors / Indicators Tightness   Pain Type Surgical pain   Pain Onset Today   Pain Frequency Constant   Aggravating Factors  increased flexion   Pain Relieving Factors ice   Effect of Pain on Daily Activities increases pain                         [facility] Adult PT Treatment/Exercise - 07/30/16 0001      Knee/Hip Exercises: Stretches   Active Hamstring Stretch Right;3 reps;30 seconds   Active Hamstring Stretch Limitations supine with rope   Quad Stretch Right;3 reps;30 seconds   Knee: Self-Stretch to increase Flexion Right;10 seconds   Knee: Self-Stretch Limitations 10 reps     Knee/Hip Exercises: Supine   Quad Sets 15 reps   Short Arc Quad Sets 15 reps   Heel Slides 15 reps   Heel Slides Limitations AAROM/PROM at end range 10X achieving 98 degrees   Knee Extension AROM;PROM   Knee Extension Limitations 3   Knee Flexion AROM;AAROM   Knee Flexion Limitations AROM 92; AAROM 99     Manual Therapy   Manual Therapy Edema management;Joint mobilization;Soft tissue mobilization;Myofascial release   Manual therapy comments manual complete separate rest of treatment; complete in supine position   Edema Management retro massage for edema control   Joint Mobilization patella all direction and tib/fib grade I-II   Soft tissue mobilization scar tissue mobilization/instruction   Myofascial Release adhesions perimeter of knee to increase ROM                  PT Education - 07/30/16 1112    Education provided Yes   Education Details Educated scar tissue massage technqiues; reinformed importance of application with ice multiple times a day   Person(s) Educated Patient   Methods Explanation;Demonstration   Comprehension Verbalized understanding;Returned demonstration          PT Short Term Goals - 07/23/16 0948      PT SHORT TERM GOAL #1   Title Pt to be walking in his home with a cane   Time 2   Period Weeks   Status Achieved     PT SHORT TERM GOAL #2   Title Pt ROM in Right knee to be five or less degrees to allow pt to ambulate with a normal gait pattern    Time 2   Period Weeks   Status Achieved      PT SHORT TERM GOAL #3   Title Pt pain in his right knee to be no greater than a 2/10 with medication to allow pt to be weight bearing for 30 minutes straight without resting    Time 4   Period Weeks   Status On-going     PT SHORT TERM GOAL #4   Title Pt flexion of his right knee to be to 105 to allow patient to ascend and descend steps with ease   Time 4   Period Weeks   Status On-going           PT Long Term Goals - 07/23/16 0949      PT LONG TERM GOAL #1   Title Pt to be walking without a cane inside and with a cane on uneven terrain.    Time 6   Period Weeks   Status Achieved     PT LONG TERM GOAL #2   Title Pt Rt knee rom to be to 120 to allow pt to squat down and pick items off the ground with ease.   Time 8   Period Weeks   Status Not Met     PT LONG TERM GOAL #3   Title Pt SLS on both legs to be up to 30 seconds to allow pt to feel confident walking on uneven terrain without an assistive device.    Time 8   Period Weeks   Status Not Met     PT LONG TERM GOAL #4   Title Pt to be able to tolerate being weight bearing for an hour and a half without increased pain in his right knee   Time 8   Period Weeks   Status Not Met               Plan - 07/30/16 1031    Clinical Impression Statement Pt presents with noted increased edema, decreased joint mobilty and adhesions on scar tissue and proximal knee which limits ROM.  Increased focus with manual techniques to decrease adhesions and retro massage for edema control to assist with ROM.  EOS pt able to acheive AAROM 96 degress and PROM to 99 degrees.  Pt adviced to increase frequency with application of ice for edema control and educated with scar tissue massage techniques.  No reports of increased pain at EOS.     Rehab Potential Good   Clinical Impairments Affecting Rehab Potential Painful Lt knee; having TKA on Lt knee first week in September   PT Frequency 3x / week   PT Duration 8 weeks   PT  Treatment/Interventions ADLs/Self Care Home Management;Cryotherapy;Electrical Stimulation;Moist Heat;Gait training;Stair training;Functional   mobility training;Therapeutic activities;Therapeutic exercise;Balance training;Neuromuscular re-education;Patient/family education;Manual techniques;Passive range of motion   PT Next Visit Plan continue with primary focus on flexion..  Continue manual to improve tightness and ROM      Patient will benefit from skilled therapeutic intervention in order to improve the following deficits and impairments:  Abnormal gait, Decreased activity tolerance, Decreased balance, Decreased endurance, Decreased range of motion, Decreased strength, Difficulty walking, Increased edema, Impaired flexibility, Pain  Visit Diagnosis: Pain in right knee  Difficulty in walking, not elsewhere classified  Unsteadiness on feet  Stiffness of right knee, not elsewhere classified     Problem List Patient Active Problem List   Diagnosis Date Noted  . S/P knee replacement 07/05/2016  . Primary osteoarthritis of left hip 08/18/2015  . Primary osteoarthritis of right knee 08/18/2015  . Primary osteoarthritis of left knee 08/18/2015  . History of osteoarthritis 10/07/2014  . Overweight(278.02) 05/16/2012   Casey Cockerham, LPTA; CBIS [phone]  Cockerham, Casey Jo 07/30/2016, 12:43 PM  [facility] [address] [place], [place], [address] Phone: [phone]   Fax:  [phone]  Name: Stanley Kelly MRN: [id number] Date of Birth: 05/10/1951    

## 2016-08-02 ENCOUNTER — Ambulatory Visit (HOSPITAL_COMMUNITY): Payer: Managed Care, Other (non HMO) | Admitting: Physical Therapy

## 2016-08-02 DIAGNOSIS — M25561 Pain in right knee: Secondary | ICD-10-CM

## 2016-08-02 DIAGNOSIS — R262 Difficulty in walking, not elsewhere classified: Secondary | ICD-10-CM

## 2016-08-02 DIAGNOSIS — M25661 Stiffness of right knee, not elsewhere classified: Secondary | ICD-10-CM

## 2016-08-02 DIAGNOSIS — R2681 Unsteadiness on feet: Secondary | ICD-10-CM

## 2016-08-02 NOTE — Therapy (Signed)
[place], [place], [address] Phone: [phone]   Fax:  [phone]  Physical Therapy Treatment  Patient Details  Name: Stanley Kelly MRN: [id number] Date of Birth: 10-10-1951 Referring Provider: Dorna Leitz  Encounter Date: 08/02/2016      PT End of Session - 08/02/16 1741    Visit Number 12   Number of Visits 24   Date for PT Re-Evaluation 08/06/16   Authorization Type [employer]   PT Start Time 0905   PT Stop Time 0950   PT Time Calculation (min) 45 min   Activity Tolerance Patient tolerated treatment well   Behavior During Therapy [facility] for tasks assessed/performed      Past Medical History:  Diagnosis Date  . Arthritis   . Hx of knee surgery     Past Surgical History:  Procedure Laterality Date  . INJECTION KNEE Bilateral 08/18/2015   Procedure: BILATERAL TOTAL KNEE BILATERAL CORTISONE INJECTION;  Surgeon: Dorna Leitz, MD;  Location: [place];  Service: Orthopedics;  Laterality: Bilateral;  . JOINT REPLACEMENT    . KNEE SURGERY    . TOTAL HIP ARTHROPLASTY Left 08/18/2015   Procedure: TOTAL HIP ARTHROPLASTY ANTERIOR APPROACH;  Surgeon: Dorna Leitz, MD;  Location: [place];  Service: Orthopedics;  Laterality: Left;  . TOTAL KNEE ARTHROPLASTY Right 07/05/2016   Procedure: RIGHT TOTAL KNEE ARTHROPLASTY;  Surgeon: Dorna Leitz, MD;  Location: [facility];  Service: Orthopedics;  Laterality: Right;  Marland Kitchen VASECTOMY  1985    There were no vitals filed for this visit.      Subjective Assessment - 08/02/16 1738    Subjective Pt states he's had a lot of tightness in the top of his scar.  STates he's also having difficulty sleeping at night. Still scheduled to have his surgery next Friday for his Lt TKA.   Currently in Pain? Yes   Pain Score 2    Pain Location Knee   Pain Orientation Right   Pain Descriptors / Indicators Tightness                         [facility] Adult PT Treatment/Exercise - 08/02/16 0001       Knee/Hip Exercises: Stretches   Active Hamstring Stretch Right;3 reps;30 seconds   Active Hamstring Stretch Limitations supine with rope   Knee: Self-Stretch to increase Flexion Right;10 seconds   Knee: Self-Stretch Limitations 10 reps   Gastroc Stretch Both;3 reps;30 seconds   Gastroc Stretch Limitations slant board     Knee/Hip Exercises: Supine   Quad Sets 15 reps   Heel Slides 15 reps   Heel Slides Limitations AAROM/PROM at end range 10X achieving 98 degrees   Knee Extension AROM;PROM   Knee Flexion AROM;AAROM   Knee Flexion Limitations AROM 92; AAROM 100     Manual Therapy   Manual Therapy Soft tissue mobilization;Myofascial release   Manual therapy comments manual complete separate rest of treatment; complete in supine position   Soft tissue mobilization scar tissue mobilization/instruction   Myofascial Release adhesions perimeter of knee to increase ROM                  PT Short Term Goals - 07/23/16 0948      PT SHORT TERM GOAL #1   Title Pt to be walking in his home with a cane   Time 2   Period Weeks   Status Achieved     PT SHORT TERM GOAL #  2   Title Pt ROM in Right knee to be five or less degrees to allow pt to ambulate with a normal gait pattern    Time 2   Period Weeks   Status Achieved     PT SHORT TERM GOAL #3   Title Pt pain in his right knee to be no greater than a 2/10 with medication to allow pt to be weight bearing for 30 minutes straight without resting    Time 4   Period Weeks   Status On-going     PT SHORT TERM GOAL #4   Title Pt flexion of his right knee to be to 105 to allow patient to ascend and descend steps with ease   Time 4   Period Weeks   Status On-going           PT Long Term Goals - 07/23/16 0949      PT LONG TERM GOAL #1   Title Pt to be walking without a cane inside and with a cane on uneven terrain.    Time 6   Period Weeks   Status Achieved     PT LONG TERM GOAL #2   Title Pt Rt knee rom to be to 120  to allow pt to squat down and pick items off the ground with ease.   Time 8   Period Weeks   Status Not Met     PT LONG TERM GOAL #3   Title Pt SLS on both legs to be up to 30 seconds to allow pt to feel confident walking on uneven terrain without an assistive device.    Time 8   Period Weeks   Status Not Met     PT LONG TERM GOAL #4   Title Pt to be able to tolerate being weight bearing for an hour and a half without increased pain in his right knee   Time 8   Period Weeks   Status Not Met               Plan - 08/02/16 1742    Clinical Impression Statement Focused primarily on tissue mobilty and improving ROM.  Able to achieve 100 degrees AAROM and 105 degrees PROM at end of session following manual techniques.  Noted hardness/adhesions in superior aspect of scar but able to resolve and decreased tightness.  Pt also with noted discomfort and sensitivity on lateral aspect of knee.  Instructed patient to ice when returning home   Rehab Potential Good   Clinical Impairments Affecting Rehab Potential Painful Lt knee; having TKA on Lt knee first week in September   PT Frequency 3x / week   PT Duration 8 weeks   PT Treatment/Interventions ADLs/Self Care Home Management;Cryotherapy;Electrical Stimulation;Moist Heat;Gait training;Stair training;Functional mobility training;Therapeutic activities;Therapeutic exercise;Balance training;Neuromuscular re-education;Patient/family education;Manual techniques;Passive range of motion   PT Next Visit Plan continue with primary focus on flexion..  Continue manual to improve tightness and ROM      Patient will benefit from skilled therapeutic intervention in order to improve the following deficits and impairments:  Abnormal gait, Decreased activity tolerance, Decreased balance, Decreased endurance, Decreased range of motion, Decreased strength, Difficulty walking, Increased edema, Impaired flexibility, Pain  Visit Diagnosis: Pain in right  knee  Difficulty in walking, not elsewhere classified  Unsteadiness on feet  Stiffness of right knee, not elsewhere classified     Problem List Patient Active Problem List   Diagnosis Date Noted  . S/P knee replacement 07/05/2016  . Primary  osteoarthritis of left hip 08/18/2015  . Primary osteoarthritis of right knee 08/18/2015  . Primary osteoarthritis of left knee 08/18/2015  . History of osteoarthritis 10/07/2014  . Overweight(278.02) 05/16/2012    Teena Irani, PTA/CLT [phone]  08/02/2016, 5:45 PM  [facility] [address] [place], [place], [address] Phone: [phone]   Fax:  [phone]  Name: Stanley Kelly MRN: [id number] Date of Birth: [birth year]

## 2016-08-03 ENCOUNTER — Telehealth (HOSPITAL_COMMUNITY): Payer: Self-pay

## 2016-08-03 NOTE — Telephone Encounter (Signed)
29/17 pt left a message to cx and only said that he would make the next Wed. appt and didn't want to RS right now

## 2016-08-04 ENCOUNTER — Ambulatory Visit (HOSPITAL_COMMUNITY): Payer: Managed Care, Other (non HMO) | Admitting: Physical Therapy

## 2016-08-04 DIAGNOSIS — R2681 Unsteadiness on feet: Secondary | ICD-10-CM

## 2016-08-04 DIAGNOSIS — M25561 Pain in right knee: Secondary | ICD-10-CM

## 2016-08-04 DIAGNOSIS — M25661 Stiffness of right knee, not elsewhere classified: Secondary | ICD-10-CM

## 2016-08-04 DIAGNOSIS — R262 Difficulty in walking, not elsewhere classified: Secondary | ICD-10-CM

## 2016-08-04 NOTE — Therapy (Signed)
[place], [place], [address] Phone: [phone]   Fax:  [phone]  Physical Therapy Treatment  Patient Details  Name: Stanley Kelly MRN: [id number] Date of Birth: 1951/01/19 Referring Provider: Dorna Leitz  Encounter Date: 08/04/2016      PT End of Session - 08/04/16 1004    Visit Number 13   Number of Visits 24   Date for PT Re-Evaluation 08/06/16   Authorization Type [employer]   PT Start Time 0815   PT Stop Time 0900   PT Time Calculation (min) 45 min   Activity Tolerance Patient tolerated treatment well   Behavior During Therapy [facility] for tasks assessed/performed      Past Medical History:  Diagnosis Date  . Arthritis   . Hx of knee surgery     Past Surgical History:  Procedure Laterality Date  . INJECTION KNEE Bilateral 08/18/2015   Procedure: BILATERAL TOTAL KNEE BILATERAL CORTISONE INJECTION;  Surgeon: Dorna Leitz, MD;  Location: [facility];  Service: Orthopedics;  Laterality: Bilateral;  . JOINT REPLACEMENT    . KNEE SURGERY    . TOTAL HIP ARTHROPLASTY Left 08/18/2015   Procedure: TOTAL HIP ARTHROPLASTY ANTERIOR APPROACH;  Surgeon: Dorna Leitz, MD;  Location: [facility];  Service: Orthopedics;  Laterality: Left;  . TOTAL KNEE ARTHROPLASTY Right 07/05/2016   Procedure: RIGHT TOTAL KNEE ARTHROPLASTY;  Surgeon: Dorna Leitz, MD;  Location: [facility];  Service: Orthopedics;  Laterality: Right;  Marland Kitchen VASECTOMY  1985    There were no vitals filed for this visit.      Subjective Assessment - 08/04/16 1001    Subjective Pt states he has less tightness today but his knee is sore.  continues to work on his ROM at home and use his bike.  He is now able to make full revolutions.  Has one more appointment here next week prior to his surgery on Friday.   Currently in Pain? Yes   Pain Score 2    Pain Location Knee   Pain Orientation Right   Pain Descriptors / Indicators Tightness                         [facility]  Adult PT Treatment/Exercise - 08/04/16 0001      Knee/Hip Exercises: Stretches   Active Hamstring Stretch Right;3 reps;30 seconds   Active Hamstring Stretch Limitations 12" step   Knee: Self-Stretch to increase Flexion Right;10 seconds   Knee: Self-Stretch Limitations 10 reps   Gastroc Stretch Both;3 reps;30 seconds   Gastroc Stretch Limitations slant board     Knee/Hip Exercises: Supine   Quad Sets 15 reps   Short Arc Quad Sets 15 reps   Heel Slides 15 reps   Heel Slides Limitations AAROM/PROM at end range 10X achieving 98 degrees   Knee Extension AROM;PROM   Knee Extension Limitations AROM 3, PROM 0   Knee Flexion AROM;AAROM   Knee Flexion Limitations AROM 92; AAROM 100     Manual Therapy   Manual Therapy Soft tissue mobilization;Myofascial release   Manual therapy comments manual complete separate rest of treatment; complete in supine position   Soft tissue mobilization scar tissue mobilization/instruction   Myofascial Release adhesions perimeter of knee to increase ROM                  PT Short Term Goals - 07/23/16 0948      PT SHORT TERM GOAL #1   Title Pt  to be walking in his home with a cane   Time 2   Period Weeks   Status Achieved     PT SHORT TERM GOAL #2   Title Pt ROM in Right knee to be five or less degrees to allow pt to ambulate with a normal gait pattern    Time 2   Period Weeks   Status Achieved     PT SHORT TERM GOAL #3   Title Pt pain in his right knee to be no greater than a 2/10 with medication to allow pt to be weight bearing for 30 minutes straight without resting    Time 4   Period Weeks   Status On-going     PT SHORT TERM GOAL #4   Title Pt flexion of his right knee to be to 105 to allow patient to ascend and descend steps with ease   Time 4   Period Weeks   Status On-going           PT Long Term Goals - 07/23/16 0949      PT LONG TERM GOAL #1   Title Pt to be walking without a cane inside and with a cane on uneven  terrain.    Time 6   Period Weeks   Status Achieved     PT LONG TERM GOAL #2   Title Pt Rt knee rom to be to 120 to allow pt to squat down and pick items off the ground with ease.   Time 8   Period Weeks   Status Not Met     PT LONG TERM GOAL #3   Title Pt SLS on both legs to be up to 30 seconds to allow pt to feel confident walking on uneven terrain without an assistive device.    Time 8   Period Weeks   Status Not Met     PT LONG TERM GOAL #4   Title Pt to be able to tolerate being weight bearing for an hour and a half without increased pain in his right knee   Time 8   Period Weeks   Status Not Met               Plan - 08/04/16 1004    Clinical Impression Statement Continued with focus on Rt knee ROM. Less tenderness and tightness noted perimeter of knee, however pt with general soreness.  Overall reduction of adhesions palpated with manual techniques.  Pt to return to surgeon on Tuesday to find out plans for therapy after surgery.  Still unable to get greater than 100 degrees flexion AAROM.   Rehab Potential Good   Clinical Impairments Affecting Rehab Potential Painful Lt knee; having TKA on Lt knee first week in September   PT Frequency 3x / week   PT Duration 8 weeks   PT Treatment/Interventions ADLs/Self Care Home Management;Cryotherapy;Electrical Stimulation;Moist Heat;Gait training;Stair training;Functional mobility training;Therapeutic activities;Therapeutic exercise;Balance training;Neuromuscular re-education;Patient/family education;Manual techniques;Passive range of motion   PT Next Visit Plan Re-eval for Rt knee.  Appointment made for Monday following surgery to resume therapy on Rt knee and evaluate Lt knee.      Patient will benefit from skilled therapeutic intervention in order to improve the following deficits and impairments:  Abnormal gait, Decreased activity tolerance, Decreased balance, Decreased endurance, Decreased range of motion, Decreased  strength, Difficulty walking, Increased edema, Impaired flexibility, Pain  Visit Diagnosis: Pain in right knee  Difficulty in walking, not elsewhere classified  Unsteadiness on feet  Stiffness  of right knee, not elsewhere classified     Problem List Patient Active Problem List   Diagnosis Date Noted  . S/P knee replacement 07/05/2016  . Primary osteoarthritis of left hip 08/18/2015  . Primary osteoarthritis of right knee 08/18/2015  . Primary osteoarthritis of left knee 08/18/2015  . History of osteoarthritis 10/07/2014  . Overweight(278.02) 05/16/2012    Teena Irani, PTA/CLT [phone]  08/04/2016, 10:11 AM  [facility] [address] [place], [place], [address] Phone: [phone]   Fax:  [phone]  Name: Stanley Kelly MRN: [id number] Date of Birth: [birth year]

## 2016-08-04 NOTE — Pre-Procedure Instructions (Signed)
Stanley Kelly  08/04/2016      [id number] - [place], [place] - [address] [place] [address] Phone: [phone] Fax: [place], [place] [address] [place] [address] Phone: [phone] Fax: [phone]    Your procedure is scheduled on Sept 8.  Report to [facility] Admitting at 530 A.M.  Call this number if you have problems the morning of surgery:  [phone]   Remember:  Do not eat food or drink liquids after midnight.  Take these medicines the morning of surgery with A SIP OF WATER Methocarbamol (Robaxin) if needed, Oxycodone (Roxicet) if needed, Promethazine (Phenergan) if needed  Stop taking aspirin, BC's, Goody's, Herbal medications, Fish oil, Ibuprofen, Advil, Motrin, Aleve, Vitamins   Do not wear jewelry, make-up or nail polish.  Do not wear lotions, powders, or perfumes, or deoderant.  Do not shave 48 hours prior to surgery.  Men may shave face and neck.  Do not bring valuables to the hospital.  [facility] is not responsible for any belongings or valuables.  Contacts, dentures or bridgework may not be worn into surgery.  Leave your suitcase in the car.  After surgery it may be brought to your room.  For patients admitted to the hospital, discharge time will be determined by your treatment team.  Patients discharged the day of surgery will not be allowed to drive home.    Special instructions:  [facility] - Preparing for Surgery  Before surgery, you can play an important role.  Because skin is not sterile, your skin needs to be as free of germs as possible.  You can reduce the number of germs on you skin by washing with CHG (chlorahexidine gluconate) soap before surgery.  CHG is an antiseptic cleaner which kills germs and bonds with the skin to continue killing germs even after washing.  Please DO NOT use if you have an  allergy to CHG or antibacterial soaps.  If your skin becomes reddened/irritated stop using the CHG and inform your nurse when you arrive at Short Stay.  Do not shave (including legs and underarms) for at least 48 hours prior to the first CHG shower.  You may shave your face.  Please follow these instructions carefully:   1.  Shower with CHG Soap the night before surgery and the  morning of Surgery.  2.  If you choose to wash your hair, wash your hair first as usual with your       normal shampoo.  3.  After you shampoo, rinse your hair and body thoroughly to remove the                      Shampoo.  4.  Use CHG as you would any other liquid soap.  You can apply chg directly       to the skin and wash gently with scrungie or a clean washcloth.  5.  Apply the CHG Soap to your body ONLY FROM THE NECK DOWN.        Do not use on open wounds or open sores.  Avoid contact with your eyes,       ears, mouth and genitals (private parts).  Wash genitals (private parts)       with your normal soap.  6.  Wash thoroughly, paying special attention to the area where your  surgery        will be performed.  7.  Thoroughly rinse your body with warm water from the neck down.  8.  DO NOT shower/wash with your normal soap after using and rinsing off       the CHG Soap.  9.  Pat yourself dry with a clean towel.            10.  Wear clean pajamas.            11.  Place clean sheets on your bed the night of your first shower and do not        sleep with pets.  Day of Surgery  Do not apply any lotions/deoderants the morning of surgery.  Please wear clean clothes to the hospital/surgery center.    Please read over the following fact sheets that you were given. Pain Booklet, MRSA Information and Surgical Site Infection Prevention

## 2016-08-05 ENCOUNTER — Other Ambulatory Visit: Payer: Self-pay | Admitting: Orthopedic Surgery

## 2016-08-05 ENCOUNTER — Telehealth: Payer: Self-pay

## 2016-08-05 ENCOUNTER — Encounter (HOSPITAL_COMMUNITY): Payer: Self-pay

## 2016-08-05 ENCOUNTER — Encounter (HOSPITAL_COMMUNITY)
Admission: RE | Admit: 2016-08-05 | Discharge: 2016-08-05 | Disposition: A | Discharge status: Home / Self Care | Payer: Managed Care, Other (non HMO) | Source: Ambulatory Visit | Attending: Orthopedic Surgery | Admitting: Orthopedic Surgery

## 2016-08-05 DIAGNOSIS — Z01812 Encounter for preprocedural laboratory examination: Secondary | ICD-10-CM | POA: Diagnosis present

## 2016-08-05 DIAGNOSIS — M1712 Unilateral primary osteoarthritis, left knee: Secondary | ICD-10-CM | POA: Diagnosis not present

## 2016-08-05 LAB — CBC
HEMATOCRIT: 39.2 % (ref 39.0–52.0)
HEMOGLOBIN: 13.2 g/dL (ref 13.0–17.0)
MCH: 30.5 pg (ref 26.0–34.0)
MCHC: 33.7 g/dL (ref 30.0–36.0)
MCV: 90.5 fL (ref 78.0–100.0)
PLATELETS: 276 10*3/uL (ref 150–400)
RBC: 4.33 MIL/uL (ref 4.22–5.81)
RDW: 12.7 % (ref 11.5–15.5)
WBC: 3 10*3/uL — AB (ref 4.0–10.5)

## 2016-08-05 LAB — SURGICAL PCR SCREEN
MRSA, PCR: NEGATIVE
Staphylococcus aureus: NEGATIVE

## 2016-08-05 LAB — BASIC METABOLIC PANEL
ANION GAP: 8 (ref 5–15)
BUN: 15 mg/dL (ref 6–20)
CHLORIDE: 105 mmol/L (ref 101–111)
CO2: 24 mmol/L (ref 22–32)
Calcium: 9.4 mg/dL (ref 8.9–10.3)
Creatinine, Ser: 0.84 mg/dL (ref 0.61–1.24)
GFR calc Af Amer: >60 mL/min (ref >60)
GLUCOSE: 117 mg/dL — AB (ref 65–99)
POTASSIUM: 4.2 mmol/L (ref 3.5–5.1)
Sodium: 137 mmol/L (ref 135–145)

## 2016-08-05 NOTE — Progress Notes (Signed)
PCP is Dr. Delman Cheadle Denies ever seeing a cardiologist. Denies ever having a stress test, Echo, or card cath. Teaching done on Incentive spirometry use. See note form Myra Gianotti from 06-29-2016

## 2016-08-05 NOTE — Telephone Encounter (Signed)
[place] - Pt had CPE on 05-17-16 and now needs form filled out and faxed to work.  Fax to [phone]  I have left this in the nurses box

## 2016-08-05 NOTE — Progress Notes (Signed)
Dr.Graves  office called and informed to sign orders spoke with Elmyra Ricks

## 2016-08-06 ENCOUNTER — Encounter (HOSPITAL_COMMUNITY): Payer: Managed Care, Other (non HMO) | Admitting: Physical Therapy

## 2016-08-06 NOTE — Telephone Encounter (Signed)
I will place in your box. 

## 2016-08-06 NOTE — Telephone Encounter (Signed)
Route

## 2016-08-11 ENCOUNTER — Ambulatory Visit (HOSPITAL_COMMUNITY): Payer: Managed Care, Other (non HMO) | Attending: Orthopedic Surgery | Admitting: Physical Therapy

## 2016-08-11 DIAGNOSIS — M25662 Stiffness of left knee, not elsewhere classified: Secondary | ICD-10-CM | POA: Insufficient documentation

## 2016-08-11 DIAGNOSIS — R262 Difficulty in walking, not elsewhere classified: Secondary | ICD-10-CM | POA: Insufficient documentation

## 2016-08-11 DIAGNOSIS — M25661 Stiffness of right knee, not elsewhere classified: Secondary | ICD-10-CM

## 2016-08-11 DIAGNOSIS — M25561 Pain in right knee: Secondary | ICD-10-CM | POA: Diagnosis present

## 2016-08-11 DIAGNOSIS — M25562 Pain in left knee: Secondary | ICD-10-CM | POA: Diagnosis present

## 2016-08-11 DIAGNOSIS — R2681 Unsteadiness on feet: Secondary | ICD-10-CM | POA: Insufficient documentation

## 2016-08-11 NOTE — Therapy (Signed)
[facility] [address] [place], [place], [address] Phone: [phone]   Fax:  [phone]  Physical Therapy Treatment (Re-Assessment)  Patient Details  Name: Stanley Kelly MRN: [id number] Date of Birth: 16-Jun-1951 Referring Provider: Dorna Leitz  Encounter Date: 08/11/2016      PT End of Session - 08/11/16 1238    Visit Number 14   Number of Visits 24   Date for PT Re-Evaluation 08/16/16   Authorization Type [employer]   PT Start Time 0815   PT Stop Time 0855   PT Time Calculation (min) 40 min   Activity Tolerance Patient tolerated treatment well   Behavior During Therapy [facility] for tasks assessed/performed      Past Medical History:  Diagnosis Date  . Arthritis   . Hx of knee surgery     Past Surgical History:  Procedure Laterality Date  . INJECTION KNEE Bilateral 08/18/2015   Procedure: BILATERAL TOTAL KNEE BILATERAL CORTISONE INJECTION;  Surgeon: Dorna Leitz, MD;  Location: [facility];  Service: Orthopedics;  Laterality: Bilateral;  . JOINT REPLACEMENT    . KNEE SURGERY    . TOTAL HIP ARTHROPLASTY Left 08/18/2015   Procedure: TOTAL HIP ARTHROPLASTY ANTERIOR APPROACH;  Surgeon: Dorna Leitz, MD;  Location: [facility];  Service: Orthopedics;  Laterality: Left;  . TOTAL KNEE ARTHROPLASTY Right 07/05/2016   Procedure: RIGHT TOTAL KNEE ARTHROPLASTY;  Surgeon: Dorna Leitz, MD;  Location: [facility];  Service: Orthopedics;  Laterality: Right;  Marland Kitchen VASECTOMY  1985    There were no vitals filed for this visit.      Subjective Assessment - 08/11/16 0817    Subjective Patient arrives today stating he is feeling stiff today, more so than usual; he is still scheduled for surgery on his L knee on Friday. They took his CPM away but he has been keeping up working on the bike for his ROM.  No falls or close calls recently. He mowed the yard the other day.    Pertinent History Lt THR;    How long can you sit comfortably? 9/6- no real issues    How long can you  stand comfortably? 9/6- has not really tried yet    How long can you walk comfortably? 9/6- walks up and down street to take dogs outside    Patient Stated Goals get more ROM in his knee    Currently in Pain? Yes   Pain Score 2    Pain Location Knee   Pain Orientation Right   Pain Descriptors / Indicators Throbbing;Pressure   Pain Type Acute pain   Pain Radiating Towards none    Pain Onset 1 to 4 weeks ago   Pain Frequency Constant   Aggravating Factors  not a lot unless it is a stiff day    Pain Relieving Factors ice    Effect of Pain on Daily Activities none             [facility] PT Assessment - 08/11/16 0001      Observation/Other Assessments   Focus on Therapeutic Outcomes (FOTO)  34% limited (was 65% limited)     AROM   Right Knee Extension 10  but at best has been 3 degrees    Right Knee Flexion 100     Strength   Right Hip Flexion 4+/5   Right Hip Extension 2+/5   Right Hip ABduction 3/5   Right Knee Flexion 3/5  within available ROM    Right Knee Extension 5/5  Right Ankle Dorsiflexion 5/5     6 minute walk test results    Aerobic Endurance Distance Walked 588   Endurance additional comments 3MWT     High Level Balance   High Level Balance Comments TUG 8.6                      [facility] Adult PT Treatment/Exercise - 08/11/16 0001      Manual Therapy   Manual Therapy Edema management   Manual therapy comments manual complete separate rest of treatment; complete in supine position   Edema Management retro massage for edema control                PT Education - 08/11/16 1238    Education provided Yes   Education Details progress with skilled PT services, POC moving forward, use of compression sleeve on R LE to help control edema/improve ROM    Person(s) Educated Patient   Methods Explanation   Comprehension Verbalized understanding          PT Short Term Goals - 08/11/16 0843      PT SHORT TERM GOAL #1   Title Pt to be walking  in his home with a cane   Time 2   Period Weeks   Status Achieved     PT SHORT TERM GOAL #2   Title Pt ROM in Right knee to be five or less degrees to allow pt to ambulate with a normal gait pattern    Baseline 9/6- 10 degrees but has gotten to 3 degrees at best    Time 2   Period Weeks   Status Achieved     PT SHORT TERM GOAL #3   Title Pt pain in his right knee to be no greater than a 2/10 with medication to allow pt to be weight bearing for 30 minutes straight without resting    Time 4   Period Weeks   Status Achieved     PT SHORT TERM GOAL #4   Title Pt flexion of his right knee to be to 105 to allow patient to ascend and descend steps with ease   Baseline 9/6- 100 actively, 105 passively    Time 4   Period Weeks   Status On-going           PT Long Term Goals - 08/11/16 0844      PT LONG TERM GOAL #1   Title Pt to be walking without a cane inside and with a cane on uneven terrain.    Time 6   Period Weeks   Status Achieved     PT LONG TERM GOAL #2   Title Pt Rt knee rom to be to 120 to allow pt to squat down and pick items off the ground with ease.   Time 8   Period Weeks   Status On-going     PT LONG TERM GOAL #3   Title Pt SLS on both legs to be up to 30 seconds to allow pt to feel confident walking on uneven terrain without an assistive device.    Baseline 9/6- 10 seconds R, DNT L due to pain L knee      PT LONG TERM GOAL #4   Title Pt to be able to tolerate being weight bearing for an hour and a half without increased pain in his right knee   Baseline 9/6- has not really tried    Time 8   Period Weeks  Status On-going               Plan - 08/11/16 1239    Clinical Impression Statement Re-assessment performed today. Patient reports subjective improvement, however upon examination continues to reveal significant R knee stiffness with localized edema, functional muscle weakness, reduced balance, and reduced functional task performance tolerance.  He has not really tried to push how far or how long he can remain weight bearing but does report that he feels much more limited by his L knee than his R; he does have surgery scheduled for his L knee on Friday 9/8. Educated patient regarding increased wear of compression stocking to assist in improving edema and consequentially ROM in R knee, also educated regarding ongoing activity with HEP and bike for R knee ROM. Recommend ongoing skilled PT services to continue focus on R knee as well as to evaluate/treat L knee per MD order following his surgery on Friday.    Rehab Potential Good   Clinical Impairments Affecting Rehab Potential Painful Lt knee; having TKA on Lt knee first week in September   PT Frequency 3x / week   PT Duration 8 weeks   PT Treatment/Interventions ADLs/Self Care Home Management;Cryotherapy;Electrical Stimulation;Moist Heat;Gait training;Stair training;Functional mobility training;Therapeutic activities;Therapeutic exercise;Balance training;Neuromuscular re-education;Patient/family education;Manual techniques;Passive range of motion   PT Next Visit Plan Eval for L knee; continue treating R knee as well as long as it remains appropriate, continue focus on R flexion ROM/edema control    PT Home Exercise Plan reviewed HEP, no additional exercises given this session.     Consulted and Agree with Plan of Care Patient      Patient will benefit from skilled therapeutic intervention in order to improve the following deficits and impairments:  Abnormal gait, Decreased activity tolerance, Decreased balance, Decreased endurance, Decreased range of motion, Decreased strength, Difficulty walking, Increased edema, Impaired flexibility, Pain  Visit Diagnosis: Pain in right knee  Difficulty in walking, not elsewhere classified  Unsteadiness on feet  Stiffness of right knee, not elsewhere classified     Problem List Patient Active Problem List   Diagnosis Date Noted  . S/P knee  replacement 07/05/2016  . Primary osteoarthritis of left hip 08/18/2015  . Primary osteoarthritis of right knee 08/18/2015  . Primary osteoarthritis of left knee 08/18/2015  . History of osteoarthritis 10/07/2014  . Overweight(278.02) 05/16/2012    Deniece Ree PT, DPT [facility] [address] [place], [place], [address] Phone: [phone]   Fax:  [phone]  Name: Stanley Kelly MRN: [id number] Date of Birth: 1951/07/22

## 2016-08-12 MED ORDER — CEFAZOLIN SODIUM-DEXTROSE 2-4 GM/100ML-% IV SOLN
2.0000 g | INTRAVENOUS | Status: AC
  Administered 2016-08-13: 2 g via INTRAVENOUS
  Filled 2016-08-12: qty 100

## 2016-08-12 NOTE — H&P (Signed)
TOTAL KNEE ADMISSION H&P  Patient is being admitted for left total knee arthroplasty.  Subjective:  Chief Complaint:left knee pain.  HPI: Stanley Kelly, 65 y.o. male, has a history of pain and functional disability in the left knee due to arthritis and has failed non-surgical conservative treatments for greater than 12 weeks to includeNSAID's and/or analgesics, corticosteriod injections, viscosupplementation injections, use of assistive devices, weight reduction as appropriate and activity modification.  Onset of symptoms was gradual, starting 5 years ago with gradually worsening course since that time. The patient noted no past surgery on the left knee(s).  Patient currently rates pain in the left knee(s) at 9 out of 10 with activity. Patient has night pain, worsening of pain with activity and weight bearing, pain that interferes with activities of daily living, pain with passive range of motion, crepitus and joint swelling.  Patient has evidence of subchondral cysts, subchondral sclerosis, periarticular osteophytes, joint subluxation and joint space narrowing by imaging studies. This patient has had failure of all reasonable conservative care. There is no active infection.  Patient Active Problem List   Diagnosis Date Noted  . S/P knee replacement 07/05/2016  . Primary osteoarthritis of left hip 08/18/2015  . Primary osteoarthritis of right knee 08/18/2015  . Primary osteoarthritis of left knee 08/18/2015  . History of osteoarthritis 10/07/2014  . Overweight(278.02) 05/16/2012   Past Medical History:  Diagnosis Date  . Arthritis   . Hx of knee surgery     Past Surgical History:  Procedure Laterality Date  . INJECTION KNEE Bilateral 08/18/2015   Procedure: BILATERAL TOTAL KNEE BILATERAL CORTISONE INJECTION;  Surgeon: Dorna Leitz, MD;  Location: [facility];  Service: Orthopedics;  Laterality: Bilateral;  . JOINT REPLACEMENT    . KNEE SURGERY    . TOTAL HIP ARTHROPLASTY Left 08/18/2015   Procedure: TOTAL HIP ARTHROPLASTY ANTERIOR APPROACH;  Surgeon: Dorna Leitz, MD;  Location: [facility];  Service: Orthopedics;  Laterality: Left;  . TOTAL KNEE ARTHROPLASTY Right 07/05/2016   Procedure: RIGHT TOTAL KNEE ARTHROPLASTY;  Surgeon: Dorna Leitz, MD;  Location: [facility];  Service: Orthopedics;  Laterality: Right;  Marland Kitchen VASECTOMY  1985    No prescriptions prior to admission.   Allergies  Allergen Reactions  . No Known Allergies     Social History  Substance Use Topics  . Smoking status: Never Smoker  . Smokeless tobacco: Never Used  . Alcohol use No    Family History  Problem Relation Age of Onset  . Adopted: Yes     ROS ROS: I have reviewed the patient's review of systems thoroughly and there are no positive responses as relates to the HPI. Objective:  Physical Exam  Vital signs in last 24 hours:   Well-developed well-nourished patient in no acute distress. Alert and oriented x3 HEENT:within normal limits Cardiac: Regular rate and rhythm Pulmonary: Lungs clear to auscultation Abdomen: Soft and nontender.  Normal active bowel sounds  Musculoskeletal: left knee: Painful range of motion.  Limited range of motion.  Obvious severe varus malalignment.  Range of motion 10-90.) Labs:  Recent Results (from the past 2160 hour(s))  CBC with Differential/Platelet     Status: Abnormal   Collection Time: 05/17/16 10:05 AM  Result Value Ref Range   WBC 3.4 (L) 3.8 - 10.8 K/uL   RBC 4.95 4.20 - 5.80 MIL/uL   Hemoglobin 15.4 13.2 - 17.1 g/dL   HCT 43.9 38.5 - 50.0 %   MCV 88.7 80.0 - 100.0 fL   MCH 31.1 27.0 -  33.0 pg   MCHC 35.1 32.0 - 36.0 g/dL   RDW 13.2 11.0 - 15.0 %   Platelets 268 140 - 400 K/uL   MPV 9.6 7.5 - 12.5 fL   Neutro Abs 1,938 1,500 - 7,800 cells/uL   Lymphs Abs 1,020 850 - 3,900 cells/uL   Monocytes Absolute 306 200 - 950 cells/uL   Eosinophils Absolute 102 15 - 500 cells/uL   Basophils Absolute 34 0 - 200 cells/uL   Neutrophils Relative % 57 %   Lymphocytes  Relative 30 %   Monocytes Relative 9 %   Eosinophils Relative 3 %   Basophils Relative 1 %   Smear Review Criteria for review not met     Comment: ** Please note change in unit of measure and reference range(s). **  COMPLETE METABOLIC PANEL WITH GFR     Status: None   Collection Time: 05/17/16 10:05 AM  Result Value Ref Range   Sodium 138 135 - 146 mmol/L   Potassium 4.2 3.5 - 5.3 mmol/L   Chloride 103 98 - 110 mmol/L   CO2 25 20 - 31 mmol/L   Glucose, Bld 99 65 - 99 mg/dL   BUN 25 7 - 25 mg/dL   Creat 0.88 0.70 - 1.25 mg/dL    Comment:   For patients > or = 63 years of age: The upper reference limit for Creatinine is approximately 13% higher for people identified as African-American.      Total Bilirubin 0.5 0.2 - 1.2 mg/dL   Alkaline Phosphatase 65 40 - 115 U/L   AST 32 10 - 35 U/L   ALT 42 9 - 46 U/L   Total Protein 7.3 6.1 - 8.1 g/dL   Albumin 5.0 3.6 - 5.1 g/dL   Calcium 10.0 8.6 - 10.3 mg/dL   GFR, Est African American >89 >=60 mL/min   GFR, Est Non African American >89 >=60 mL/min  Lipid panel     Status: None   Collection Time: 05/17/16 10:05 AM  Result Value Ref Range   Cholesterol 192 125 - 200 mg/dL   Triglycerides 127 <150 mg/dL   HDL 46 >=40 mg/dL   Total CHOL/HDL Ratio 4.2 <=5.0 Ratio   VLDL 25 <30 mg/dL   LDL Cholesterol 121 <130 mg/dL    Comment:   Total Cholesterol/HDL Ratio:CHD Risk                        Coronary Heart Disease Risk Table                                        Men       Women          1/2 Average Risk              3.4        3.3              Average Risk              5.0        4.4           2X Average Risk              9.6        7.1           3X Average Risk  23.4       11.0 Use the calculated Patient Ratio above and the CHD Risk table  to determine the patient's CHD Risk.   IFOBT POC (occult bld, rslt in office)     Status: None   Collection Time: 05/21/16  6:30 PM  Result Value Ref Range   IFOBT Negative      Comment: pt did three samples all three were neg  Urinalysis, Routine w reflex microscopic (not at [facility])     Status: None   Collection Time: 06/25/16  8:44 AM  Result Value Ref Range   Color, Urine YELLOW YELLOW   APPearance CLEAR CLEAR   Specific Gravity, Urine 1.018 1.005 - 1.030   pH 5.5 5.0 - 8.0   Glucose, UA NEGATIVE NEGATIVE mg/dL   Hgb urine dipstick NEGATIVE NEGATIVE   Bilirubin Urine NEGATIVE NEGATIVE   Ketones, ur NEGATIVE NEGATIVE mg/dL   Protein, ur NEGATIVE NEGATIVE mg/dL   Nitrite NEGATIVE NEGATIVE   Leukocytes, UA NEGATIVE NEGATIVE    Comment: MICROSCOPIC NOT DONE ON URINES WITH NEGATIVE PROTEIN, BLOOD, LEUKOCYTES, NITRITE, OR GLUCOSE <1000 mg/dL.  Surgical pcr screen     Status: None   Collection Time: 06/25/16  8:44 AM  Result Value Ref Range   MRSA, PCR NEGATIVE NEGATIVE   Staphylococcus aureus NEGATIVE NEGATIVE    Comment:        The Xpert SA Assay (FDA approved for NASAL specimens in patients over 49 years of age), is one component of a comprehensive surveillance program.  Test performance has been validated by [facility] for patients greater than or equal to 77 year old. It is not intended to diagnose infection nor to guide or monitor treatment.   APTT     Status: None   Collection Time: 06/25/16  8:45 AM  Result Value Ref Range   aPTT 28 24 - 37 seconds  CBC WITH DIFFERENTIAL     Status: Abnormal   Collection Time: 06/25/16  8:45 AM  Result Value Ref Range   WBC 3.0 (L) 4.0 - 10.5 K/uL   RBC 4.63 4.22 - 5.81 MIL/uL   Hemoglobin 14.2 13.0 - 17.0 g/dL   HCT 41.4 39.0 - 52.0 %   MCV 89.4 78.0 - 100.0 fL   MCH 30.7 26.0 - 34.0 pg   MCHC 34.3 30.0 - 36.0 g/dL   RDW 12.5 11.5 - 15.5 %   Platelets 262 150 - 400 K/uL   Neutrophils Relative % 41 %   Neutro Abs 1.3 (L) 1.7 - 7.7 K/uL   Lymphocytes Relative 45 %   Lymphs Abs 1.4 0.7 - 4.0 K/uL   Monocytes Relative 10 %   Monocytes Absolute 0.3 0.1 - 1.0 K/uL   Eosinophils Relative 3 %    Eosinophils Absolute 0.1 0.0 - 0.7 K/uL   Basophils Relative 1 %   Basophils Absolute 0.0 0.0 - 0.1 K/uL  Comprehensive metabolic panel     Status: Abnormal   Collection Time: 06/25/16  8:45 AM  Result Value Ref Range   Sodium 138 135 - 145 mmol/L   Potassium 3.9 3.5 - 5.1 mmol/L   Chloride 107 101 - 111 mmol/L   CO2 25 22 - 32 mmol/L   Glucose, Bld 110 (H) 65 - 99 mg/dL   BUN 13 6 - 20 mg/dL   Creatinine, Ser 0.97 0.61 - 1.24 mg/dL   Calcium 9.7 8.9 - 10.3 mg/dL   Total Protein 6.5 6.5 - 8.1 g/dL   Albumin 4.1  3.5 - 5.0 g/dL   AST 24 15 - 41 U/L   ALT 25 17 - 63 U/L   Alkaline Phosphatase 65 38 - 126 U/L   Total Bilirubin 0.5 0.3 - 1.2 mg/dL   GFR calc non Af Amer >60 >60 mL/min   GFR calc Af Amer >60 >60 mL/min    Comment: (NOTE) The eGFR has been calculated using the CKD EPI equation. This calculation has not been validated in all clinical situations. eGFR's persistently <60 mL/min signify possible Chronic Kidney Disease.    Anion gap 6 5 - 15  Protime-INR     Status: None   Collection Time: 06/25/16  8:45 AM  Result Value Ref Range   Prothrombin Time 13.9 11.6 - 15.2 seconds   INR 1.05 0.00 - 1.49  Type and screen Order type and screen if day of surgery is less than 15 days from draw of preadmission visit or order morning of surgery if day of surgery is greater than 6 days from preadmission visit.     Status: None   Collection Time: 06/25/16  9:00 AM  Result Value Ref Range   ABO/RH(D) AB POS    Antibody Screen NEG    Sample Expiration 07/09/2016    Extend sample reason NO TRANSFUSIONS OR PREGNANCY IN THE PAST 3 MONTHS   CBC     Status: Abnormal   Collection Time: 07/06/16  5:55 AM  Result Value Ref Range   WBC 5.5 4.0 - 10.5 K/uL   RBC 3.84 (L) 4.22 - 5.81 MIL/uL   Hemoglobin 11.7 (L) 13.0 - 17.0 g/dL   HCT 34.8 (L) 39.0 - 52.0 %   MCV 90.6 78.0 - 100.0 fL   MCH 30.5 26.0 - 34.0 pg   MCHC 33.6 30.0 - 36.0 g/dL   RDW 12.7 11.5 - 15.5 %   Platelets 208 150 - 400  K/uL  Basic metabolic panel     Status: Abnormal   Collection Time: 07/06/16  5:55 AM  Result Value Ref Range   Sodium 138 135 - 145 mmol/L   Potassium 3.6 3.5 - 5.1 mmol/L   Chloride 105 101 - 111 mmol/L   CO2 28 22 - 32 mmol/L   Glucose, Bld 100 (H) 65 - 99 mg/dL   BUN 14 6 - 20 mg/dL   Creatinine, Ser 0.82 0.61 - 1.24 mg/dL   Calcium 8.9 8.9 - 10.3 mg/dL   GFR calc non Af Amer >60 >60 mL/min   GFR calc Af Amer >60 >60 mL/min    Comment: (NOTE) The eGFR has been calculated using the CKD EPI equation. This calculation has not been validated in all clinical situations. eGFR's persistently <60 mL/min signify possible Chronic Kidney Disease.    Anion gap 5 5 - 15  Surgical pcr screen     Status: None   Collection Time: 08/05/16  9:25 AM  Result Value Ref Range   MRSA, PCR NEGATIVE NEGATIVE   Staphylococcus aureus NEGATIVE NEGATIVE    Comment:        The Xpert SA Assay (FDA approved for NASAL specimens in patients over 25 years of age), is one component of a comprehensive surveillance program.  Test performance has been validated by [facility] for patients greater than or equal to 57 year old. It is not intended to diagnose infection nor to guide or monitor treatment.   CBC     Status: Abnormal   Collection Time: 08/05/16  9:30 AM  Result Value Ref  Range   WBC 3.0 (L) 4.0 - 10.5 K/uL   RBC 4.33 4.22 - 5.81 MIL/uL   Hemoglobin 13.2 13.0 - 17.0 g/dL   HCT 39.2 39.0 - 52.0 %   MCV 90.5 78.0 - 100.0 fL   MCH 30.5 26.0 - 34.0 pg   MCHC 33.7 30.0 - 36.0 g/dL   RDW 12.7 11.5 - 15.5 %   Platelets 276 150 - 400 K/uL  Basic metabolic panel     Status: Abnormal   Collection Time: 08/05/16  9:30 AM  Result Value Ref Range   Sodium 137 135 - 145 mmol/L   Potassium 4.2 3.5 - 5.1 mmol/L   Chloride 105 101 - 111 mmol/L   CO2 24 22 - 32 mmol/L   Glucose, Bld 117 (H) 65 - 99 mg/dL   BUN 15 6 - 20 mg/dL   Creatinine, Ser 0.84 0.61 - 1.24 mg/dL   Calcium 9.4 8.9 - 10.3 mg/dL    GFR calc non Af Amer >60 >60 mL/min   GFR calc Af Amer >60 >60 mL/min    Comment: (NOTE) The eGFR has been calculated using the CKD EPI equation. This calculation has not been validated in all clinical situations. eGFR's persistently <60 mL/min signify possible Chronic Kidney Disease.    Anion gap 8 5 - 15    Estimated body mass index is 31.28 kg/m as calculated from the following:   Height as of 08/05/16: '5\' 9"'  (1.753 m).   Weight as of 08/05/16: 96.1 kg (211 lb 12.8 oz).   Imaging Review Plain radiographs demonstrate severe degenerative joint disease of the left knee(s). The overall alignment issignificant varus. The bone quality appears to be fair for age and reported activity level.  Assessment/Plan:  End stage arthritis, left knee   The patient history, physical examination, clinical judgment of the provider and imaging studies are consistent with end stage degenerative joint disease of the left knee(s) and total knee arthroplasty is deemed medically necessary. The treatment options including medical management, injection therapy arthroscopy and arthroplasty were discussed at length. The risks and benefits of total knee arthroplasty were presented and reviewed. The risks due to aseptic loosening, infection, stiffness, patella tracking problems, thromboembolic complications and other imponderables were discussed. The patient acknowledged the explanation, agreed to proceed with the plan and consent was signed. Patient is being admitted for inpatient treatment for surgery, pain control, PT, OT, prophylactic antibiotics, VTE prophylaxis, progressive ambulation and ADL's and discharge planning. The patient is planning to be discharged home with home health services

## 2016-08-12 NOTE — Telephone Encounter (Signed)
Done - please copy and scan the form - it is up front in the box with provider Rx

## 2016-08-13 ENCOUNTER — Inpatient Hospital Stay (HOSPITAL_COMMUNITY): Payer: Managed Care, Other (non HMO) | Admitting: Certified Registered Nurse Anesthetist

## 2016-08-13 ENCOUNTER — Encounter (HOSPITAL_COMMUNITY)
Admission: RE | Disposition: A | Discharge status: Home / Self Care | Payer: Self-pay | Source: Ambulatory Visit | Attending: Orthopedic Surgery

## 2016-08-13 ENCOUNTER — Inpatient Hospital Stay (HOSPITAL_COMMUNITY)
Admission: RE | Admit: 2016-08-13 | Discharge: 2016-08-14 | DRG: 470 | Disposition: A | Discharge status: Home / Self Care | Payer: Managed Care, Other (non HMO) | Source: Ambulatory Visit | Attending: Orthopedic Surgery | Admitting: Orthopedic Surgery

## 2016-08-13 ENCOUNTER — Encounter (HOSPITAL_COMMUNITY): Payer: Self-pay | Admitting: *Deleted

## 2016-08-13 DIAGNOSIS — Z96651 Presence of right artificial knee joint: Secondary | ICD-10-CM | POA: Diagnosis present

## 2016-08-13 DIAGNOSIS — Z96642 Presence of left artificial hip joint: Secondary | ICD-10-CM | POA: Diagnosis present

## 2016-08-13 DIAGNOSIS — M1712 Unilateral primary osteoarthritis, left knee: Secondary | ICD-10-CM | POA: Diagnosis present

## 2016-08-13 DIAGNOSIS — K219 Gastro-esophageal reflux disease without esophagitis: Secondary | ICD-10-CM | POA: Diagnosis present

## 2016-08-13 DIAGNOSIS — M25562 Pain in left knee: Secondary | ICD-10-CM | POA: Diagnosis present

## 2016-08-13 HISTORY — DX: Calculus of kidney: N20.0

## 2016-08-13 HISTORY — PX: TOTAL KNEE ARTHROPLASTY: SHX125

## 2016-08-13 HISTORY — DX: Gastro-esophageal reflux disease without esophagitis: K21.9

## 2016-08-13 SURGERY — ARTHROPLASTY, KNEE, TOTAL
Anesthesia: Spinal | Laterality: Left

## 2016-08-13 MED ORDER — MAGNESIUM CITRATE PO SOLN: 1.0000 | Freq: Once | ORAL | Status: DC | PRN

## 2016-08-13 MED ORDER — BISACODYL 5 MG PO TBEC: 5.0000 mg | DELAYED_RELEASE_TABLET | Freq: Every day | ORAL | Status: DC | PRN

## 2016-08-13 MED ORDER — LACTATED RINGERS IV SOLN
INTRAVENOUS | Status: DC | PRN
  Administered 2016-08-13 (×2): via INTRAVENOUS

## 2016-08-13 MED ORDER — METHOCARBAMOL 500 MG PO TABS
500.0000 mg | ORAL_TABLET | Freq: Four times a day (QID) | ORAL | Status: DC | PRN
  Administered 2016-08-13 – 2016-08-14 (×2): 500 mg via ORAL
  Filled 2016-08-13 (×2): qty 1

## 2016-08-13 MED ORDER — TRANEXAMIC ACID 1000 MG/10ML IV SOLN
1000.0000 mg | Freq: Once | INTRAVENOUS | Status: AC
  Administered 2016-08-13: 1000 mg via INTRAVENOUS
  Filled 2016-08-13: qty 10

## 2016-08-13 MED ORDER — LIDOCAINE HCL (CARDIAC) 20 MG/ML IV SOLN
INTRAVENOUS | Status: DC | PRN
  Administered 2016-08-13: 40 mg via INTRATRACHEAL

## 2016-08-13 MED ORDER — ONDANSETRON HCL 4 MG PO TABS: 4.0000 mg | ORAL_TABLET | Freq: Four times a day (QID) | ORAL | Status: DC | PRN

## 2016-08-13 MED ORDER — PROPOFOL 1000 MG/100ML IV EMUL
INTRAVENOUS | Status: AC
  Filled 2016-08-13: qty 100

## 2016-08-13 MED ORDER — DOCUSATE SODIUM 100 MG PO CAPS
100.0000 mg | ORAL_CAPSULE | Freq: Two times a day (BID) | ORAL | Status: DC
  Administered 2016-08-13 – 2016-08-14 (×2): 100 mg via ORAL
  Filled 2016-08-13 (×2): qty 1

## 2016-08-13 MED ORDER — ONDANSETRON HCL 4 MG/2ML IJ SOLN
INTRAMUSCULAR | Status: AC
  Filled 2016-08-13: qty 2

## 2016-08-13 MED ORDER — ACETAMINOPHEN 325 MG PO TABS: 650.0000 mg | ORAL_TABLET | Freq: Four times a day (QID) | ORAL | Status: DC | PRN

## 2016-08-13 MED ORDER — GLYCOPYRROLATE 0.2 MG/ML IJ SOLN
INTRAMUSCULAR | Status: DC | PRN
  Administered 2016-08-13 (×2): 0.2 mg via INTRAVENOUS

## 2016-08-13 MED ORDER — LIDOCAINE 2% (20 MG/ML) 5 ML SYRINGE
INTRAMUSCULAR | Status: AC
  Filled 2016-08-13: qty 5

## 2016-08-13 MED ORDER — HYDROMORPHONE HCL 1 MG/ML IJ SOLN
1.0000 mg | INTRAMUSCULAR | Status: DC | PRN
Start: 2016-08-13 — End: 2016-08-14
  Administered 2016-08-13: 2 mg via INTRAVENOUS
  Filled 2016-08-13: qty 2

## 2016-08-13 MED ORDER — SODIUM CHLORIDE 0.9 % IV SOLN
INTRAVENOUS | Status: DC
  Administered 2016-08-13: 14:00:00 via INTRAVENOUS
  Administered 2016-08-14: 100 mL/h via INTRAVENOUS

## 2016-08-13 MED ORDER — BUPIVACAINE HCL (PF) 0.5 % IJ SOLN
INTRAMUSCULAR | Status: AC
  Filled 2016-08-13: qty 30

## 2016-08-13 MED ORDER — OXYCODONE HCL 5 MG PO TABS
ORAL_TABLET | ORAL | Status: AC
  Filled 2016-08-13: qty 2

## 2016-08-13 MED ORDER — CEFAZOLIN SODIUM-DEXTROSE 2-4 GM/100ML-% IV SOLN
2.0000 g | Freq: Four times a day (QID) | INTRAVENOUS | Status: AC
  Administered 2016-08-13 (×2): 2 g via INTRAVENOUS
  Filled 2016-08-13 (×2): qty 100

## 2016-08-13 MED ORDER — GLYCOPYRROLATE 0.2 MG/ML IV SOSY
PREFILLED_SYRINGE | INTRAVENOUS | Status: AC
  Filled 2016-08-13: qty 3

## 2016-08-13 MED ORDER — POLYETHYLENE GLYCOL 3350 17 G PO PACK
17.0000 g | PACK | Freq: Every day | ORAL | Status: DC | PRN
Start: 2016-08-13 — End: 2016-08-14

## 2016-08-13 MED ORDER — ACETAMINOPHEN 650 MG RE SUPP: 650.0000 mg | Freq: Four times a day (QID) | RECTAL | Status: DC | PRN

## 2016-08-13 MED ORDER — PROPOFOL 500 MG/50ML IV EMUL
INTRAVENOUS | Status: DC | PRN
  Administered 2016-08-13: 25 ug/kg/min via INTRAVENOUS

## 2016-08-13 MED ORDER — SODIUM CHLORIDE 0.9 % IR SOLN
Status: DC | PRN
  Administered 2016-08-13 (×2): 1000 mL

## 2016-08-13 MED ORDER — FENTANYL CITRATE (PF) 100 MCG/2ML IJ SOLN
INTRAMUSCULAR | Status: DC | PRN
  Administered 2016-08-13 (×2): 50 ug via INTRAVENOUS

## 2016-08-13 MED ORDER — DEXAMETHASONE SODIUM PHOSPHATE 10 MG/ML IJ SOLN
10.0000 mg | Freq: Two times a day (BID) | INTRAMUSCULAR | Status: AC
  Administered 2016-08-13 – 2016-08-14 (×3): 10 mg via INTRAVENOUS
  Filled 2016-08-13 (×3): qty 1

## 2016-08-13 MED ORDER — PROPOFOL 10 MG/ML IV BOLUS
INTRAVENOUS | Status: AC
  Filled 2016-08-13: qty 20

## 2016-08-13 MED ORDER — BUPIVACAINE HCL (PF) 0.5 % IJ SOLN
INTRAMUSCULAR | Status: DC | PRN
  Administered 2016-08-13: 20 mL

## 2016-08-13 MED ORDER — PANTOPRAZOLE SODIUM 40 MG PO TBEC
40.0000 mg | DELAYED_RELEASE_TABLET | Freq: Every day | ORAL | Status: DC
  Administered 2016-08-13 – 2016-08-14 (×2): 40 mg via ORAL
  Filled 2016-08-13 (×2): qty 1

## 2016-08-13 MED ORDER — ASPIRIN EC 325 MG PO TBEC: 325.0000 mg | DELAYED_RELEASE_TABLET | Freq: Two times a day (BID) | ORAL | 0 refills | Status: DC

## 2016-08-13 MED ORDER — ZOLPIDEM TARTRATE 5 MG PO TABS: 5.0000 mg | ORAL_TABLET | Freq: Every evening | ORAL | Status: DC | PRN

## 2016-08-13 MED ORDER — BUPIVACAINE LIPOSOME 1.3 % IJ SUSP
20.0000 mL | INTRAMUSCULAR | Status: AC
  Administered 2016-08-13: 20 mL
  Filled 2016-08-13: qty 20

## 2016-08-13 MED ORDER — MIDAZOLAM HCL 2 MG/2ML IJ SOLN
INTRAMUSCULAR | Status: DC | PRN
Start: 2016-08-13 — End: 2016-08-13
  Administered 2016-08-13 (×2): 1 mg via INTRAVENOUS

## 2016-08-13 MED ORDER — ONDANSETRON HCL 4 MG/2ML IJ SOLN
INTRAMUSCULAR | Status: DC | PRN
  Administered 2016-08-13: 4 mg via INTRAVENOUS

## 2016-08-13 MED ORDER — ONDANSETRON HCL 4 MG/2ML IJ SOLN: 4.0000 mg | Freq: Four times a day (QID) | INTRAMUSCULAR | Status: DC | PRN

## 2016-08-13 MED ORDER — 0.9 % SODIUM CHLORIDE (POUR BTL) OPTIME
TOPICAL | Status: DC | PRN
  Administered 2016-08-13: 1000 mL

## 2016-08-13 MED ORDER — PROPOFOL 10 MG/ML IV BOLUS
INTRAVENOUS | Status: DC | PRN
  Administered 2016-08-13: 30 mg via INTRAVENOUS
  Administered 2016-08-13: 20 mg via INTRAVENOUS
  Administered 2016-08-13: 30 mg via INTRAVENOUS

## 2016-08-13 MED ORDER — OXYCODONE HCL 5 MG PO TABS
5.0000 mg | ORAL_TABLET | ORAL | Status: DC | PRN
  Administered 2016-08-13: 5 mg via ORAL
  Administered 2016-08-13: 10 mg via ORAL
  Administered 2016-08-14: 5 mg via ORAL
  Administered 2016-08-14: 10 mg via ORAL
  Filled 2016-08-13: qty 2
  Filled 2016-08-13 (×2): qty 1

## 2016-08-13 MED ORDER — DIPHENHYDRAMINE HCL 12.5 MG/5ML PO ELIX: 12.5000 mg | ORAL_SOLUTION | ORAL | Status: DC | PRN

## 2016-08-13 MED ORDER — METHOCARBAMOL 1000 MG/10ML IJ SOLN
500.0000 mg | Freq: Four times a day (QID) | INTRAVENOUS | Status: DC | PRN
  Filled 2016-08-13: qty 5

## 2016-08-13 MED ORDER — ALUM & MAG HYDROXIDE-SIMETH 200-200-20 MG/5ML PO SUSP: 30.0000 mL | ORAL | Status: DC | PRN

## 2016-08-13 MED ORDER — TRANEXAMIC ACID 1000 MG/10ML IV SOLN
1000.0000 mg | INTRAVENOUS | Status: AC
  Administered 2016-08-13: 1000 mg via INTRAVENOUS
  Filled 2016-08-13: qty 10

## 2016-08-13 MED ORDER — CHLORHEXIDINE GLUCONATE 4 % EX LIQD
60.0000 mL | Freq: Once | CUTANEOUS | Status: DC
Start: 2016-08-13 — End: 2016-08-13

## 2016-08-13 MED ORDER — KETAMINE HCL 10 MG/ML IJ SOLN
INTRAMUSCULAR | Status: DC | PRN
  Administered 2016-08-13 (×2): 20 mg via INTRAVENOUS
  Administered 2016-08-13: 10 mg via INTRAVENOUS

## 2016-08-13 MED ORDER — GABAPENTIN 300 MG PO CAPS
300.0000 mg | ORAL_CAPSULE | Freq: Two times a day (BID) | ORAL | Status: DC
  Administered 2016-08-13 – 2016-08-14 (×3): 300 mg via ORAL
  Filled 2016-08-13 (×3): qty 1

## 2016-08-13 MED ORDER — SODIUM CHLORIDE 0.9 % IJ SOLN
INTRAMUSCULAR | Status: DC | PRN
  Administered 2016-08-13: 20 mL

## 2016-08-13 MED ORDER — MIDAZOLAM HCL 2 MG/2ML IJ SOLN
INTRAMUSCULAR | Status: AC
  Filled 2016-08-13: qty 2

## 2016-08-13 MED ORDER — METHOCARBAMOL 750 MG PO TABS: 750.0000 mg | ORAL_TABLET | Freq: Three times a day (TID) | ORAL | 0 refills | Status: DC | PRN

## 2016-08-13 MED ORDER — KETAMINE HCL-SODIUM CHLORIDE 100-0.9 MG/10ML-% IV SOSY
PREFILLED_SYRINGE | INTRAVENOUS | Status: AC
  Filled 2016-08-13: qty 10

## 2016-08-13 MED ORDER — KETOROLAC TROMETHAMINE 15 MG/ML IJ SOLN
15.0000 mg | Freq: Four times a day (QID) | INTRAMUSCULAR | Status: AC
  Administered 2016-08-13 – 2016-08-14 (×4): 15 mg via INTRAVENOUS
  Filled 2016-08-13 (×4): qty 1

## 2016-08-13 MED ORDER — PHENYLEPHRINE HCL 10 MG/ML IJ SOLN
INTRAVENOUS | Status: DC | PRN
  Administered 2016-08-13: 25 ug/min via INTRAVENOUS

## 2016-08-13 MED ORDER — FENTANYL CITRATE (PF) 100 MCG/2ML IJ SOLN
INTRAMUSCULAR | Status: AC
  Filled 2016-08-13: qty 2

## 2016-08-13 MED ORDER — ASPIRIN EC 325 MG PO TBEC
325.0000 mg | DELAYED_RELEASE_TABLET | Freq: Two times a day (BID) | ORAL | Status: DC
  Administered 2016-08-13 – 2016-08-14 (×2): 325 mg via ORAL
  Filled 2016-08-13 (×2): qty 1

## 2016-08-13 MED ORDER — BUPIVACAINE IN DEXTROSE 0.75-8.25 % IT SOLN
INTRATHECAL | Status: DC | PRN
  Administered 2016-08-13: 2 mL via INTRATHECAL

## 2016-08-13 MED ORDER — OXYCODONE-ACETAMINOPHEN 5-325 MG PO TABS: 1.0000 | ORAL_TABLET | ORAL | 0 refills | Status: DC | PRN

## 2016-08-13 SURGICAL SUPPLY — 65 items
BANDAGE ESMARK 6X9 LF (GAUZE/BANDAGES/DRESSINGS) ×1 IMPLANT
BENZOIN TINCTURE PRP APPL 2/3 (GAUZE/BANDAGES/DRESSINGS) ×3 IMPLANT
BLADE SAGITTAL 25.0X1.19X90 (BLADE) ×2 IMPLANT
BLADE SAGITTAL 25.0X1.19X90MM (BLADE) ×1
BLADE SAW SAG 90X13X1.27 (BLADE) ×3 IMPLANT
BNDG ESMARK 6X9 LF (GAUZE/BANDAGES/DRESSINGS) ×3
BOWL SMART MIX CTS (DISPOSABLE) ×3 IMPLANT
CAPT KNEE TOTAL 3 ATTUNE ×3 IMPLANT
CEMENT HV SMART SET (Cement) ×6 IMPLANT
CLOSURE WOUND 1/2 X4 (GAUZE/BANDAGES/DRESSINGS) ×2
COVER SURGICAL LIGHT HANDLE (MISCELLANEOUS) ×3 IMPLANT
CUFF TOURNIQUET SINGLE 34IN LL (TOURNIQUET CUFF) ×3 IMPLANT
CUFF TOURNIQUET SINGLE 44IN (TOURNIQUET CUFF) IMPLANT
DRAPE EXTREMITY T 121X128X90 (DRAPE) ×3 IMPLANT
DRAPE IMP U-DRAPE 54X76 (DRAPES) ×3 IMPLANT
DRAPE U-SHAPE 47X51 STRL (DRAPES) ×3 IMPLANT
DRSG AQUACEL AG ADV 3.5X10 (GAUZE/BANDAGES/DRESSINGS) ×3 IMPLANT
DRSG MEPILEX BORDER 4X12 (GAUZE/BANDAGES/DRESSINGS) ×3 IMPLANT
DRSG PAD ABDOMINAL 8X10 ST (GAUZE/BANDAGES/DRESSINGS) ×3 IMPLANT
DURAPREP 26ML APPLICATOR (WOUND CARE) ×3 IMPLANT
ELECT REM PT RETURN 9FT ADLT (ELECTROSURGICAL) ×3
ELECTRODE REM PT RTRN 9FT ADLT (ELECTROSURGICAL) ×1 IMPLANT
EVACUATOR 1/8 PVC DRAIN (DRAIN) ×3 IMPLANT
FACESHIELD WRAPAROUND (MASK) ×3 IMPLANT
GAUZE SPONGE 4X4 12PLY STRL (GAUZE/BANDAGES/DRESSINGS) ×3 IMPLANT
GLOVE BIOGEL PI IND STRL 8 (GLOVE) ×2 IMPLANT
GLOVE BIOGEL PI INDICATOR 8 (GLOVE) ×4
GLOVE ECLIPSE 7.5 STRL STRAW (GLOVE) ×6 IMPLANT
GOWN STRL REUS W/ TWL LRG LVL3 (GOWN DISPOSABLE) ×1 IMPLANT
GOWN STRL REUS W/ TWL XL LVL3 (GOWN DISPOSABLE) ×2 IMPLANT
GOWN STRL REUS W/TWL LRG LVL3 (GOWN DISPOSABLE) ×2
GOWN STRL REUS W/TWL XL LVL3 (GOWN DISPOSABLE) ×4
HANDPIECE INTERPULSE COAX TIP (DISPOSABLE) ×2
HOOD PEEL AWAY FACE SHEILD DIS (HOOD) ×9 IMPLANT
IMMOBILIZER KNEE 20 (SOFTGOODS) IMPLANT
IMMOBILIZER KNEE 22 (SOFTGOODS) ×3 IMPLANT
IMMOBILIZER KNEE 22 UNIV (SOFTGOODS) ×3 IMPLANT
KIT BASIN OR (CUSTOM PROCEDURE TRAY) ×3 IMPLANT
KIT ROOM TURNOVER OR (KITS) ×3 IMPLANT
MANIFOLD NEPTUNE II (INSTRUMENTS) ×3 IMPLANT
NEEDLE SPNL 22GX3.5 QUINCKE BK (NEEDLE) ×3 IMPLANT
NS IRRIG 1000ML POUR BTL (IV SOLUTION) ×3 IMPLANT
PACK TOTAL JOINT (CUSTOM PROCEDURE TRAY) ×3 IMPLANT
PACK UNIVERSAL I (CUSTOM PROCEDURE TRAY) ×3 IMPLANT
PAD ARMBOARD 7.5X6 YLW CONV (MISCELLANEOUS) ×6 IMPLANT
PAD CAST 4YDX4 CTTN HI CHSV (CAST SUPPLIES) ×1 IMPLANT
PADDING CAST COTTON 4X4 STRL (CAST SUPPLIES) ×2
PADDING CAST COTTON 6X4 STRL (CAST SUPPLIES) ×3 IMPLANT
SET HNDPC FAN SPRY TIP SCT (DISPOSABLE) ×1 IMPLANT
SPONGE GAUZE 4X4 12PLY STER LF (GAUZE/BANDAGES/DRESSINGS) ×3 IMPLANT
STAPLER VISISTAT 35W (STAPLE) IMPLANT
STRIP CLOSURE SKIN 1/2X4 (GAUZE/BANDAGES/DRESSINGS) ×4 IMPLANT
SUCTION FRAZIER HANDLE 10FR (MISCELLANEOUS) ×2
SUCTION TUBE FRAZIER 10FR DISP (MISCELLANEOUS) ×1 IMPLANT
SUT MNCRL AB 3-0 PS2 18 (SUTURE) IMPLANT
SUT VIC AB 0 CTB1 27 (SUTURE) ×6 IMPLANT
SUT VIC AB 1 CT1 27 (SUTURE) ×4
SUT VIC AB 1 CT1 27XBRD ANBCTR (SUTURE) ×2 IMPLANT
SUT VIC AB 2-0 CTB1 (SUTURE) ×6 IMPLANT
SYR 50ML LL SCALE MARK (SYRINGE) ×3 IMPLANT
TOWEL OR 17X24 6PK STRL BLUE (TOWEL DISPOSABLE) ×3 IMPLANT
TOWEL OR 17X26 10 PK STRL BLUE (TOWEL DISPOSABLE) ×3 IMPLANT
TRAY CATH 16FR W/PLASTIC CATH (SET/KITS/TRAYS/PACK) ×3 IMPLANT
TRAY FOLEY CATH 16FRSI W/METER (SET/KITS/TRAYS/PACK) IMPLANT
WRAP KNEE MAXI GEL POST OP (GAUZE/BANDAGES/DRESSINGS) ×3 IMPLANT

## 2016-08-13 NOTE — Transfer of Care (Signed)
Immediate Anesthesia Transfer of Care Note  Patient: Stanley Kelly  Procedure(s) Performed: Procedure(s): TOTAL KNEE ARTHROPLASTY (Left)  Patient Location: PACU  Anesthesia Type:Spinal  Level of Consciousness: awake, alert  and patient cooperative  Airway & Oxygen Therapy: Patient Spontanous Breathing and Patient connected to nasal cannula oxygen  Post-op Assessment: Report given to RN, Post -op Vital signs reviewed and stable and Patient able to stick tongue midline  Post vital signs: Reviewed and stable  Last Vitals:  Vitals:   08/13/16 0951 08/13/16 0952  BP: 106/73   Pulse: 82 78  Resp:  20  Temp:      Last Pain:  Vitals:   08/13/16 0613  TempSrc: Oral         Complications: No apparent anesthesia complications

## 2016-08-13 NOTE — Anesthesia Procedure Notes (Signed)
Spinal  Patient location during procedure: OB Staffing Anesthesiologist: Jakira Mcfadden Preanesthetic Checklist Completed: patient identified, surgical consent, pre-op evaluation, timeout performed, IV checked, risks and benefits discussed and monitors and equipment checked Spinal Block Patient position: sitting Prep: site prepped and draped and DuraPrep Patient monitoring: heart rate, cardiac monitor, continuous pulse ox and blood pressure Approach: midline Location: L3-4 Injection technique: single-shot Needle Needle type: Pencan  Needle gauge: 24 G Needle length: 10 cm Assessment Sensory level: T6

## 2016-08-13 NOTE — Progress Notes (Signed)
Orthopedic Tech Progress Note Patient Details:  Stanley Kelly 06/29/1951 [id number]  CPM Left Knee CPM Left Knee: On Left Knee Flexion (Degrees): 90 Left Knee Extension (Degrees): 0 Additional Comments: trapeze bar patient helper   [name] 08/13/2016, 10:40 AM Viewed order from doctor's order list

## 2016-08-13 NOTE — Discharge Instructions (Signed)

## 2016-08-13 NOTE — Brief Op Note (Signed)
08/13/2016  9:19 AM  PATIENT:  Stanley Kelly  65 y.o. male  PRE-OPERATIVE DIAGNOSIS:  OSTEOARTHRITIS LEFT KNEE  POST-OPERATIVE DIAGNOSIS:  OSTEOARTHRITIS LEFT KNEE  PROCEDURE:  Procedure(s): TOTAL KNEE ARTHROPLASTY (Left)  SURGEON:  Surgeon(s) and Role:    * Dorna Leitz, MD - Primary  PHYSICIAN ASSISTANT:   ASSISTANTS: bethune   ANESTHESIA:   spinal  EBL:  Total I/O In: 1000 [I.V.:1000] Out: 10 [Blood:10]  BLOOD ADMINISTERED:none  DRAINS: none   LOCAL MEDICATIONS USED:  MARCAINE    and OTHER experel  SPECIMEN:  No Specimen  DISPOSITION OF SPECIMEN:  N/A  COUNTS:  YES  TOURNIQUET:   Total Tourniquet Time Documented: Thigh (Left) - 64 minutes Total: Thigh (Left) - 64 minutes   DICTATION: .Other Dictation: Dictation Number [id number]  PLAN OF CARE: Admit to inpatient   PATIENT DISPOSITION:  PACU - hemodynamically stable.   Delay start of Pharmacological VTE agent (>24hrs) due to surgical blood loss or risk of bleeding: no

## 2016-08-13 NOTE — Anesthesia Preprocedure Evaluation (Signed)
Anesthesia Evaluation  Patient identified by MRN, date of birth, ID band Patient awake    Reviewed: Allergy & Precautions, NPO status , Patient's Chart, lab work & pertinent test results  History of Anesthesia Complications Negative for: history of anesthetic complications  Airway Mallampati: II  TM Distance: >3 FB Neck ROM: Full    Dental  (+) Teeth Intact   Pulmonary neg pulmonary ROS,    breath sounds clear to auscultation       Cardiovascular negative cardio ROS   Rhythm:Regular     Neuro/Psych negative psych ROS   GI/Hepatic Neg liver ROS, GERD  Controlled,  Endo/Other  negative endocrine ROS  Renal/GU negative Renal ROS     Musculoskeletal  (+) Arthritis ,   Abdominal   Peds  Hematology negative hematology ROS (+)   Anesthesia Other Findings   Reproductive/Obstetrics                             Anesthesia Physical Anesthesia Plan  ASA: I  Anesthesia Plan: Spinal   Post-op Pain Management:    Induction:   Airway Management Planned: Natural Airway, Nasal Cannula and Simple Face Mask  Additional Equipment: None  Intra-op Plan:   Post-operative Plan: Extubation in OR  Informed Consent: I have reviewed the patients History and Physical, chart, labs and discussed the procedure including the risks, benefits and alternatives for the proposed anesthesia with the patient or authorized representative who has indicated his/her understanding and acceptance.   Dental advisory given  Plan Discussed with: CRNA and Surgeon  Anesthesia Plan Comments:         Anesthesia Quick Evaluation

## 2016-08-13 NOTE — Evaluation (Signed)
Physical Therapy Evaluation Patient Details Name: Stanley Kelly MRN: [id number] DOB: October 12, 1951 Today's Date: 08/13/2016   History of Present Illness  PT is a 65 y.o. male now s/p Lt TKA. PMH: Rt TKA (7/17), Lt THA.   Clinical Impression  Pt is s/p Lt TKA resulting in the deficits listed below (see PT Problem List). Pt able to ambulate 25 ft with rw and min guard assist during the initial PT session. Pt will benefit from skilled PT to increase their independence and safety with mobility to allow discharge to home with family support. Pt states that he does not want HHPT services but is planning to return to [facility] on 08/16/16.      Follow Up Recommendations Outpatient PT;Supervision for mobility/OOB (Pt states he is scheduled at [facility] for 08/16/16. Declines HHPT)    Equipment Recommendations  None recommended by PT    Recommendations for Other Services       Precautions / Restrictions Precautions Precautions: Knee;Fall Precaution Booklet Issued: Yes (comment) Precaution Comments: HEP provided, reviewed knee extension precautions Required Braces or Orthoses: Knee Immobilizer - Left Knee Immobilizer - Left: On when out of bed or walking Restrictions Weight Bearing Restrictions: Yes LLE Weight Bearing: Weight bearing as tolerated      Mobility  Bed Mobility Overal bed mobility: Needs Assistance Bed Mobility: Supine to Sit     Supine to sit: Min assist     General bed mobility comments: Assist needed with LT LE  Transfers Overall transfer level: Needs assistance Equipment used: Rolling walker (2 wheeled) Transfers: Sit to/from Stand Sit to Stand: Min guard         General transfer comment: Pt standing from EOB despite cues to remain seated. Difficulty with sitting due to Limited Rt knee flexion., heavy reliance on UEs  Ambulation/Gait Ambulation/Gait assistance: Min guard Ambulation Distance (Feet): 25 Feet Assistive device: Rolling walker (2 wheeled) Gait  Pattern/deviations: Step-to pattern;Decreased weight shift to left;Decreased step length - left Gait velocity: decreased   General Gait Details: small strides, mild instability but no loss of balance.   Stairs            Wheelchair Mobility    Modified Rankin (Stroke Patients Only)       Balance Overall balance assessment: Needs assistance Sitting-balance support: No upper extremity supported Sitting balance-[name]: Good     Standing balance support: Single extremity supported Standing balance-[name]: Fair Standing balance comment: able to stand at EOB and use one hand to pull up underpants.                              Pertinent Vitals/Pain Pain Assessment: 0-10 Pain Score: 7  Pain Location: Lt knee Pain Descriptors / Indicators: Aching;Throbbing Pain Intervention(s): Limited activity within patient's tolerance;Monitored during session    [place]aine expects to be discharged to:: Private residence Living Arrangements: Spouse/significant other Available Help at Discharge: Family;Available 24 hours/day Type of Home: House Home Access: Stairs to enter Entrance Stairs-Rails: None Entrance Stairs-Number of Steps: 1 Home Layout: One level Home Equipment: Walker - 2 wheels;Toilet riser;Grab bars - tub/shower;Hand held shower head;Shower seat      Prior Function Level of Independence: Independent with assistive device(s)         Comments: using SPC     Hand Dominance        Extremity/Trunk Assessment   Upper Extremity Assessment: Overall WFL for tasks assessed  Lower Extremity Assessment: LLE deficits/detail   LLE Deficits / Details: unable to perform SLR     Communication   Communication: No difficulties  Cognition Arousal/Alertness: Awake/alert Behavior During Therapy: WFL for tasks assessed/performed Overall Cognitive Status: Within Functional Limits for tasks assessed                       General Comments      Exercises        Assessment/Plan    PT Assessment Patient needs continued PT services  PT Diagnosis Difficulty walking   PT Problem List Decreased strength;Decreased range of motion;Decreased balance;Decreased activity tolerance;Decreased mobility  PT Treatment Interventions DME instruction;Gait training;Stair training;Functional mobility training;Therapeutic activities;Therapeutic exercise;Patient/family education   PT Goals (Current goals can be found in the Care Plan section) Acute Rehab PT Goals Patient Stated Goal: get home PT Goal Formulation: With patient Time For Goal Achievement: 08/27/16 Potential to Achieve Goals: Good    Frequency 7X/week   Barriers to discharge        Co-evaluation               End of Session Equipment Utilized During Treatment: Gait belt;Left knee immobilizer Activity Tolerance: Patient tolerated treatment well Patient left: in chair;with call bell/phone within reach;with family/visitor present Nurse Communication: Mobility status;Weight bearing status         Time: [id number] PT Time Calculation (min) (ACUTE ONLY): 31 min   Charges:   PT Evaluation $PT Eval Moderate Complexity: 1 Procedure PT Treatments $Gait Training: 8-22 mins   PT G Codes:        Cassell Clement, PT, CSCS Pager [phone] Office [phone]  08/13/2016, 3:25 PM

## 2016-08-14 LAB — BASIC METABOLIC PANEL
ANION GAP: 7 (ref 5–15)
BUN: 11 mg/dL (ref 6–20)
CALCIUM: 9.5 mg/dL (ref 8.9–10.3)
CO2: 24 mmol/L (ref 22–32)
Chloride: 107 mmol/L (ref 101–111)
Creatinine, Ser: 0.83 mg/dL (ref 0.61–1.24)
Glucose, Bld: 166 mg/dL — ABNORMAL HIGH (ref 65–99)
Potassium: 3.7 mmol/L (ref 3.5–5.1)
Sodium: 138 mmol/L (ref 135–145)

## 2016-08-14 LAB — CBC
HEMATOCRIT: 34 % — AB (ref 39.0–52.0)
Hemoglobin: 11.5 g/dL — ABNORMAL LOW (ref 13.0–17.0)
MCH: 29.8 pg (ref 26.0–34.0)
MCHC: 33.8 g/dL (ref 30.0–36.0)
MCV: 88.1 fL (ref 78.0–100.0)
PLATELETS: 273 10*3/uL (ref 150–400)
RBC: 3.86 MIL/uL — ABNORMAL LOW (ref 4.22–5.81)
RDW: 12.6 % (ref 11.5–15.5)
WBC: 7.2 10*3/uL (ref 4.0–10.5)

## 2016-08-14 NOTE — Discharge Summary (Signed)
Patient ID: Stanley Kelly MRN: [id number] DOB/AGE: 1951-01-18 65 y.o.  Admit date: 08/13/2016 Discharge date: 08/14/2016  Admission Diagnoses:  Principal Problem:   Primary osteoarthritis of left knee   Discharge Diagnoses:  Same  Past Medical History:  Diagnosis Date  . Arthritis    "knees" (08/13/2016)  . GERD (gastroesophageal reflux disease)   . Kidney stones    "no OR" (08/13/2016)    Surgeries: Procedure(s): TOTAL KNEE ARTHROPLASTY on 08/13/2016   Consultants:   Discharged Condition: Improved  Hospital Course: ROMELIO KLEIN is an 65 y.o. male who was admitted 08/13/2016 for operative treatment ofPrimary osteoarthritis of left knee. Patient has severe unremitting pain that affects sleep, daily activities, and work/hobbies. After pre-op clearance the patient was taken to the operating room on 08/13/2016 and underwent  Procedure(s): TOTAL KNEE ARTHROPLASTY.    Patient was given perioperative antibiotics: Anti-infectives    Start     Dose/Rate Route Frequency Ordered Stop   08/13/16 1400  ceFAZolin (ANCEF) IVPB 2g/100 mL premix     2 g 200 mL/hr over 30 Minutes Intravenous Every 6 hours 08/13/16 1230 08/13/16 2017   08/13/16 0700  ceFAZolin (ANCEF) IVPB 2g/100 mL premix     2 g 200 mL/hr over 30 Minutes Intravenous To ShortStay Surgical 08/12/16 1122 08/13/16 0738       Patient was given sequential compression devices, early ambulation, and chemoprophylaxis to prevent DVT.  Patient benefited maximally from hospital stay and there were no complications.    Recent vital signs: Patient Vitals for the past 24 hrs:  BP Temp Temp src Pulse Resp SpO2  08/14/16 0458 124/69 98.7 F (37.1 C) Oral 74 18 98 %  08/13/16 1951 134/72 99.3 F (37.4 C) Oral 97 18 98 %  08/13/16 1300 120/73 97.7 F (36.5 C) Oral 74 17 100 %  08/13/16 1229 119/77 97.9 F (36.6 C) - 72 - 100 %  08/13/16 1212 123/79 97.2 F (36.2 C) - 65 18 96 %  08/13/16 1204 114/81 - - - 16 100 %  08/13/16 1145 (!) 148/80  - - (!) 58 19 98 %  08/13/16 1130 103/73 - - (!) 50 16 98 %  08/13/16 1115 104/69 - - (!) 52 18 98 %  08/13/16 1100 106/70 - - (!) 54 16 98 %  08/13/16 1045 106/67 - - 61 18 99 %  08/13/16 1030 112/74 - - (!) 59 14 99 %  08/13/16 1015 113/78 - - 71 17 100 %  08/13/16 1000 - - - 71 16 100 %  08/13/16 0952 - - - 78 20 100 %  08/13/16 0951 106/73 97.5 F (36.4 C) - 82 - 100 %     Recent laboratory studies:  Recent Labs  08/14/16 0431  WBC 7.2  HGB 11.5*  HCT 34.0*  PLT 273  NA 138  K 3.7  CL 107  CO2 24  BUN 11  CREATININE 0.83  GLUCOSE 166*  CALCIUM 9.5     Discharge Medications:     Medication List    TAKE these medications   aspirin EC 325 MG tablet Take 1 tablet (325 mg total) by mouth 2 (two) times daily after a meal. Take x 1 month post op to decrease risk of blood clots. What changed:  when to take this  additional instructions   esomeprazole 20 MG capsule Commonly known as:  NEXIUM Take 20 mg by mouth daily at 12 noon.   methocarbamol 750 MG tablet Commonly known  as:  ROBAXIN Take 1 tablet (750 mg total) by mouth every 8 (eight) hours as needed for muscle spasms. What changed:  when to take this   oxyCODONE-acetaminophen 5-325 MG tablet Commonly known as:  ROXICET Take 1-2 tablets by mouth every 4 (four) hours as needed for severe pain.   promethazine 50 MG tablet Commonly known as:  PHENERGAN Take 1 tablet (50 mg total) by mouth every 6 (six) hours as needed for nausea or vomiting.       Diagnostic Studies: No results found.  Disposition: 01-Home or Self Care  Discharge Instructions    Call MD / Call 911    Complete by:  As directed   If you experience chest pain or shortness of breath, CALL 911 and be transported to the hospital emergency room.  If you develope a fever above 101 F, pus (white drainage) or increased drainage or redness at the wound, or calf pain, call your surgeon's office.   Constipation Prevention    Complete by:  As  directed   Drink plenty of fluids.  Prune juice may be helpful.  You may use a stool softener, such as Colace (over the counter) 100 mg twice a day.  Use MiraLax (over the counter) for constipation as needed.   Diet - low sodium heart healthy    Complete by:  As directed   Discharge instructions    Complete by:  As directed   INSTRUCTIONS AFTER JOINT REPLACEMENT   Remove items at home which could result in a fall. This includes throw rugs or furniture in walking pathways ICE to the affected joint every three hours while awake for 30 minutes at a time, for at least the first 3-5 days, and then as needed for pain and swelling.  Continue to use ice for pain and swelling. You may notice swelling that will progress down to the foot and ankle.  This is normal after surgery.  Elevate your leg when you are not up walking on it.   Continue to use the breathing machine you got in the hospital (incentive spirometer) which will help keep your temperature down.  It is common for your temperature to cycle up and down following surgery, especially at night when you are not up moving around and exerting yourself.  The breathing machine keeps your lungs expanded and your temperature down.   DIET:  As you were doing prior to hospitalization, we recommend a well-balanced diet.  DRESSING / WOUND CARE / SHOWERING  You may shower 3 days after surgery, but keep the wounds dry during showering.  You may use an occlusive plastic wrap (Press'n Seal for example), NO SOAKING/SUBMERGING IN THE BATHTUB.  If the bandage gets wet, change with a clean dry gauze.  If the incision gets wet, pat the wound dry with a clean towel.  ACTIVITY  Increase activity slowly as tolerated, but follow the weight bearing instructions below.   No driving for 6 weeks or until further direction given by your physician.  You cannot drive while taking narcotics.  No lifting or carrying greater than 10 lbs. until further directed by your  surgeon. Avoid periods of inactivity such as sitting longer than an hour when not asleep. This helps prevent blood clots.  You may return to work once you are authorized by your doctor.     WEIGHT BEARING   Weight bearing as tolerated with assist device (walker, cane, etc) as directed, use it as long as suggested by your surgeon  or therapist, typically at least 4-6 weeks.   EXERCISES  Results after joint replacement surgery are often greatly improved when you follow the exercise, range of motion and muscle strengthening exercises prescribed by your doctor. Safety measures are also important to protect the joint from further injury. Any time any of these exercises cause you to have increased pain or swelling, decrease what you are doing until you are comfortable again and then slowly increase them. If you have problems or questions, call your caregiver or physical therapist for advice.   Rehabilitation is important following a joint replacement. After just a few days of immobilization, the muscles of the leg can become weakened and shrink (atrophy).  These exercises are designed to build up the tone and strength of the thigh and leg muscles and to improve motion. Often times heat used for twenty to thirty minutes before working out will loosen up your tissues and help with improving the range of motion but do not use heat for the first two weeks following surgery (sometimes heat can increase post-operative swelling).   These exercises can be done on a training (exercise) mat, on the floor, on a table or on a bed. Use whatever works the best and is most comfortable for you.    Use music or television while you are exercising so that the exercises are a pleasant break in your day. This will make your life better with the exercises acting as a break in your routine that you can look forward to.   Perform all exercises about fifteen times, three times per day or as directed.  You should exercise both the  operative leg and the other leg as well.   Exercises include:   Quad Sets - Tighten up the muscle on the front of the thigh (Quad) and hold for 5-10 seconds.   Straight Leg Raises - With your knee straight (if you were given a brace, keep it on), lift the leg to 60 degrees, hold for 3 seconds, and slowly lower the leg.  Perform this exercise against resistance later as your leg gets stronger.  Leg Slides: Lying on your back, slowly slide your foot toward your buttocks, bending your knee up off the floor (only go as far as is comfortable). Then slowly slide your foot back down until your leg is flat on the floor again.  Angel Wings: Lying on your back spread your legs to the side as far apart as you can without causing discomfort.  Hamstring Strength:  Lying on your back, push your heel against the floor with your leg straight by tightening up the muscles of your buttocks.  Repeat, but this time bend your knee to a comfortable angle, and push your heel against the floor.  You may put a pillow under the heel to make it more comfortable if necessary.   A rehabilitation program following joint replacement surgery can speed recovery and prevent re-injury in the future due to weakened muscles. Contact your doctor or a physical therapist for more information on knee rehabilitation.    CONSTIPATION  Constipation is defined medically as fewer than three stools per week and severe constipation as less than one stool per week.  Even if you have a regular bowel pattern at home, your normal regimen is likely to be disrupted due to multiple reasons following surgery.  Combination of anesthesia, postoperative narcotics, change in appetite and fluid intake all can affect your bowels.   YOU MUST use at least one of  the following options; they are listed in order of increasing strength to get the job done.  They are all available over the counter, and you may need to use some, POSSIBLY even all of these options:     Drink plenty of fluids (prune juice may be helpful) and high fiber foods Colace 100 mg by mouth twice a day  Senokot for constipation as directed and as needed Dulcolax (bisacodyl), take with full glass of water  Miralax (polyethylene glycol) once or twice a day as needed.  If you have tried all these things and are unable to have a bowel movement in the first 3-4 days after surgery call either your surgeon or your primary doctor.    If you experience loose stools or diarrhea, hold the medications until you stool forms back up.  If your symptoms do not get better within 1 week or if they get worse, check with your doctor.  If you experience "the worst abdominal pain ever" or develop nausea or vomiting, please contact the office immediately for further recommendations for treatment.   ITCHING:  If you experience itching with your medications, try taking only a single pain pill, or even half a pain pill at a time.  You can also use Benadryl over the counter for itching or also to help with sleep.   TED HOSE STOCKINGS:  Use stockings on both legs until for at least 2 weeks or as directed by physician office. They may be removed at night for sleeping.  MEDICATIONS:  See your medication summary on the "After Visit Summary" that nursing will review with you.  You may have some home medications which will be placed on hold until you complete the course of blood thinner medication.  It is important for you to complete the blood thinner medication as prescribed.  PRECAUTIONS:  If you experience chest pain or shortness of breath - call 911 immediately for transfer to the hospital emergency department.   If you develop a fever greater that 101 F, purulent drainage from wound, increased redness or drainage from wound, foul odor from the wound/dressing, or calf pain - CONTACT YOUR SURGEON.                                                   FOLLOW-UP APPOINTMENTS:  If you do not already have a post-op  appointment, please call the office for an appointment to be seen by your surgeon.  Guidelines for how soon to be seen are listed in your "After Visit Summary", but are typically between 1-4 weeks after surgery.  OTHER INSTRUCTIONS:   Knee Replacement:  Do not place pillow under knee, focus on keeping the knee straight while resting. CPM instructions: 0-90 degrees, 2 hours in the morning, 2 hours in the afternoon, and 2 hours in the evening. Place foam block, curve side up under heel at all times except when in CPM or when walking.  DO NOT modify, tear, cut, or change the foam block in any way.  MAKE SURE YOU:  Understand these instructions.  Get help right away if you are not doing well or get worse.    Thank you for letting us be a part of your medical care team.  It is a privilege we respect greatly.  We hope these instructions will help you stay on track for  a fast and full recovery!   Increase activity slowly as tolerated    Complete by:  As directed      Follow-up Information    GRAVES,JOHN L, MD. Schedule an appointment as soon as possible for a visit in 2 weeks.   Specialty:  Orthopedic Surgery Contact information: [place] [address] [phone]            Signed: Rich Fuchs 08/14/2016, 8:18 AM

## 2016-08-14 NOTE — Op Note (Signed)
NAME:  Stanley Kelly, Stanley Kelly NO.:  [id number]  MEDICAL RECORD NO.:  [id number]  LOCATION:  5N30C                        FACILITY:  [facility]  PHYSICIAN:  [facility] Corning, M.D.   DATE OF BIRTH:  14-Feb-1951  DATE OF PROCEDURE:  08/13/2016 DATE OF DISCHARGE:                              OPERATIVE REPORT   IDENTIFICATION:  This is a 65 year old male in the Orthopedice Service today.  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  PROCEDURES:  Left total knee replacement with an Attune system size 6 femur, size 6 tibia, 14 mm bridging bearing, and a 38 mm all polyethylene patella.  SURGEON:  [facility] Corning, M.D.  ASSISTANT:  Gary Fleet, P.A.  ANESTHESIA:  Spinal.  BRIEF HISTORY:  Mr. Dembo is a 65 year old male with a long history significant for complaints of left knee pain.  He had been treated conservatively for prolonged period of time.  He had a right total knee replacement done for severe deformity 6 weeks ago and had done great with that and came now for his left knee because it was certainly impeding his rehab on the right and was his more painful side.  He had severe bone-on-bone change.  He was having night pain and light activity pain and after failure of prolonged conservative care, he was taken to the operating room for left total knee replacement.  DESCRIPTION OF PROCEDURE:  The patient was taken to the operating room. After adequate anesthesia was obtained with spinal anesthetic, patient was placed supine on the operating table.  The left leg was prepped and draped in the usual sterile fashion.  Following this, the leg was exsanguinated and blood pressure tourniquet was inflated to 300 mmHg. Following this, a midline incision was made encompassing his old incision so we did have to  swing slightly medially.  Once this was done, the subcutaneous tissue down to the level of extensor mechanism and a medial parapatellar arthrotomy was undertaken.  A large medial release was done because of the severe and prolonged varus malalignment. Following this, attention was turned to the knee where the retropatellar fat pad was removed, synovium on the anterior aspect of the femur, medial and lateral meniscus, and anterior posterior cruciates were removed.  Once this was done, attention was turned toward the femur when intramedullary pilot hole was drilled and a guide rod was placed at the central part of the femur and a 4-degree valgus cut was made with a 9 degree distal bone resection.  Once this was done, the femur sized to a 6.  Anterior posterior cuts were made, chamfers and box.  Attention turned to the tibia, sized to a 6, it was drilled and keeled after extensive medial release and removal of medial osteophytes.  Once this was done, the trial components were put in place, size 6 femur, size 6 tibia.  Because of the severe varus malalignment to get to a flat tibia, we knew we had to take a big cut and it turned out to be a 14 poly that  gave excellent fit and fill.  I needed 17.5 on the other side and this was consistent with the Attune on this side.  Once this was done, attention was turned back toward the patella which was cut down to a level 13 mm.  A 38 poly was chosen.  Lugs were drilled in the femur and the knee was put through range of motion.  Excellent stability was achieved at this point.  Once this was done, the trial components removed.  The knee was copiously and thoroughly lavaged, pulsatile lavage, irrigation and suctioned dry.  The final components were then cemented into place.  Size 6 tibia, size 6 femur, and 14 mm bridging bearing trial was placed and a 38 All Poly patella placed and held with a clamp.  The cement was allowed to completely harden and once it  did, tourniquet was let down.  All bleeding was controlled with electrocautery.  Towel clips were used because of the overall loosening of the ligaments and this 14 seems to be the right choice for him.  The 14 final was opened and placed and the knee put through a range of motion, excellent stability and range of motion was achieved.  Medial parapatellar arthrotomy was closed with 1 Vicryl running from the top and bottom and then tied in the middle and then following this, the subcu was closed with 0 and 2-0 Vicryl, and 3-0 Monocryl subcuticular. Benzoin and Steri-Strips were applied.  Sterile compressive dressing was applied and the patient was taken to the recovery where he was noted to be in satisfactory condition.  Estimated blood loss for the procedureis minimal.     [facility] Corning, M.D.     Corliss Skains  D:  08/13/2016  T:  08/14/2016  Job:  [id number]

## 2016-08-14 NOTE — Care Management Note (Addendum)
Case Management Note  Patient Details  Name: Stanley Kelly MRN: [id number] Date of Birth: 1951/06/19  Subjective/Objective: 65 yo M s/p L TKA. He had a R TKA on 06/21/16.                  Action/Plan: CM referral to assist with [facility] needs and DME   Expected Discharge Date: 08/15/16                 Expected Discharge Plan:  Home/Self Care  In-House Referral:     Discharge planning Services  CM Consult  Post Acute Care Choice:    Choice offered to:     DME Arranged:    DME Agency:     HH Arranged:    HH Agency:     Status of Service:  Completed, signed off  If discussed at [employer] of Stay Meetings, dates discussed:    Additional Comments: met with pt and wife at bedside. D/C plan is to return home with the support of his wife. He has DME from previous surgery. He reports that he is going to have outpt PT and the therapy has been arranged. He denies any D/C needs.  Norina Buzzard, RN 08/14/2016, 12:37 PM

## 2016-08-14 NOTE — Progress Notes (Signed)
Subjective: 1 Day Post-Op Procedure(s) (LRB): TOTAL KNEE ARTHROPLASTY (Left)  Activity level:  wbat Diet tolerance:  ok Voiding:  ok Patient reports pain as mild and moderate.    Objective: Vital signs in last 24 hours: Temp:  [97.2 F (36.2 C)-99.3 F (37.4 C)] 98.7 F (37.1 C) (09/09 0458) Pulse Rate:  [50-97] 74 (09/09 0458) Resp:  [14-20] 18 (09/09 0458) BP: (103-148)/(67-81) 124/69 (09/09 0458) SpO2:  [96 %-100 %] 98 % (09/09 0458)  Labs:  Recent Labs  08/14/16 0431  HGB 11.5*    Recent Labs  08/14/16 0431  WBC 7.2  RBC 3.86*  HCT 34.0*  PLT 273    Recent Labs  08/14/16 0431  NA 138  K 3.7  CL 107  CO2 24  BUN 11  CREATININE 0.83  GLUCOSE 166*  CALCIUM 9.5   No results for input(s): LABPT, INR in the last 72 hours.  Physical Exam:  Neurologically intact ABD soft Neurovascular intact Sensation intact distally Intact pulses distally Dorsiflexion/Plantar flexion intact Incision: dressing C/D/I and no drainage No cellulitis present Compartment soft  Assessment/Plan:  1 Day Post-Op Procedure(s) (LRB): TOTAL KNEE ARTHROPLASTY (Left) Advance diet Up with therapy D/C IV fluids Discharge home with home health today if he does well with PT. Continue on aasa 325mg  bid x 4 weeks for dvt prevention. Follow up in office 2 weeks post op.  Christine Morton, Larwance Sachs 08/14/2016, 8:16 AM

## 2016-08-14 NOTE — Progress Notes (Addendum)
Physical Therapy Treatment Patient Details Name: Stanley Kelly MRN: [id number] DOB: 08/03/1951 Today's Date: 08/14/2016    History of Present Illness Pt is a 65 y.o. male now s/p Lt TKA. PMH: Rt TKA (7/17), Lt THA.     PT Comments    Pt presented supine in bed with HOB elevated, with L LE in CPM, awake and willing to participate in therapy session. Pt's wife was present throughout session as well. Pt making good progress towards achieving his functional goals and successfully completed stair training this session. Pt would continue to benefit from skilled physical therapy services at this time while admitted and after d/c to address his limitations in order to improve his overall safety and independence with functional mobility.   Follow Up Recommendations  Outpatient PT;Supervision for mobility/OOB     Equipment Recommendations  None recommended by PT    Recommendations for Other Services       Precautions / Restrictions Precautions Precautions: Knee;Fall Precaution Comments: HEP provided, reviewed knee extension precautions Restrictions Weight Bearing Restrictions: Yes LLE Weight Bearing: Weight bearing as tolerated    Mobility  Bed Mobility Overal bed mobility: Needs Assistance Bed Mobility: Supine to Sit     Supine to sit: Supervision;HOB elevated     General bed mobility comments: pt required increased time  Transfers Overall transfer level: Needs assistance Equipment used: Rolling walker (2 wheeled) Transfers: Sit to/from Stand Sit to Stand: Supervision         General transfer comment: pt required VC'ing for bilateral hand placement  Ambulation/Gait Ambulation/Gait assistance: Min guard Ambulation Distance (Feet): 150 Feet (150 ft x2 with stair training in between) Assistive device: Rolling walker (2 wheeled) Gait Pattern/deviations: Step-through pattern;Decreased step length - right;Decreased stance time - left;Decreased weight shift to left Gait  velocity: decreased Gait velocity interpretation: Below normal speed for age/gender     Stairs Stairs: Yes Stairs assistance: Min guard Stair Management: No rails;Backwards;With walker Number of Stairs: 1 General stair comments: pt ascended with R LE leading and descended with L LE leading  Wheelchair Mobility    Modified Rankin (Stroke Patients Only)       Balance Overall balance assessment: Needs assistance Sitting-balance support: Feet supported;No upper extremity supported Sitting balance-[name]: Good     Standing balance support: During functional activity;Single extremity supported Standing balance-[name]: Poor                      Cognition Arousal/Alertness: Awake/alert Behavior During Therapy: WFL for tasks assessed/performed Overall Cognitive Status: Within Functional Limits for tasks assessed                      Exercises Total Joint Exercises Goniometric ROM: Flexion = 62 degrees, Extension = lacking 5 degrees to neutral; measured in sitting    General Comments        Pertinent Vitals/Pain Pain Assessment: Faces Faces Pain Scale: Hurts a little bit Pain Location: L knee Pain Descriptors / Indicators: Sore;Guarding Pain Intervention(s): Monitored during session;Repositioned    Home Living                      Prior Function            PT Goals (current goals can now be found in the care plan section) Acute Rehab PT Goals Patient Stated Goal: to go home today PT Goal Formulation: With patient Time For Goal Achievement: 08/27/16 Potential to Achieve Goals: Good Progress  towards PT goals: Progressing toward goals    Frequency  7X/week    PT Plan Current plan remains appropriate    Co-evaluation             End of Session Equipment Utilized During Treatment: Gait belt Activity Tolerance: Patient tolerated treatment well Patient left: in chair;with call bell/phone within reach;with family/visitor  present     Time: [id number] PT Time Calculation (min) (ACUTE ONLY): 32 min  Charges:  $Gait Training: 23-37 mins                    G CodesClearnce Sorrel Rea Kalama 08/31/2016, 1:22 PM Sherie Don, [place], DPT [phone]

## 2016-08-16 ENCOUNTER — Encounter (HOSPITAL_COMMUNITY): Payer: Self-pay | Admitting: Orthopedic Surgery

## 2016-08-16 ENCOUNTER — Ambulatory Visit (HOSPITAL_COMMUNITY): Payer: Managed Care, Other (non HMO) | Admitting: Physical Therapy

## 2016-08-16 DIAGNOSIS — R2681 Unsteadiness on feet: Secondary | ICD-10-CM | POA: Diagnosis present

## 2016-08-16 DIAGNOSIS — M25661 Stiffness of right knee, not elsewhere classified: Secondary | ICD-10-CM | POA: Diagnosis present

## 2016-08-16 DIAGNOSIS — M25662 Stiffness of left knee, not elsewhere classified: Secondary | ICD-10-CM

## 2016-08-16 DIAGNOSIS — R262 Difficulty in walking, not elsewhere classified: Secondary | ICD-10-CM

## 2016-08-16 DIAGNOSIS — M25562 Pain in left knee: Secondary | ICD-10-CM | POA: Diagnosis present

## 2016-08-16 DIAGNOSIS — M25561 Pain in right knee: Secondary | ICD-10-CM | POA: Diagnosis not present

## 2016-08-16 NOTE — Therapy (Signed)
[facility] [place], [place], [address] Phone: [phone]   Fax:  [phone]  Physical Therapy Evaluation  Patient Details  Name: Stanley Kelly MRN: [id number] Date of Birth: Aug 31, 1951 Referring Provider: Dorna Leitz   Encounter Date: 08/16/2016      PT End of Session - 08/16/16 1611    Visit Number 1   Number of Visits 18   Date for PT Re-Evaluation 09/15/16   Authorization Type [employer] Managed   Authorization - Visit Number 1   Authorization - Number of Visits 10   PT Start Time 0815   PT Stop Time 0900   PT Time Calculation (min) 45 min   Activity Tolerance Patient tolerated treatment well   Behavior During Therapy [facility] for tasks assessed/performed      Past Medical History:  Diagnosis Date  . Arthritis    "knees" (08/13/2016)  . GERD (gastroesophageal reflux disease)   . Kidney stones    "no OR" (08/13/2016)    Past Surgical History:  Procedure Laterality Date  . INJECTION KNEE Bilateral 08/18/2015   Procedure: BILATERAL TOTAL KNEE BILATERAL CORTISONE INJECTION;  Surgeon: Dorna Leitz, MD;  Location: [facility];  Service: Orthopedics;  Laterality: Bilateral;  . JOINT REPLACEMENT    . KNEE ARTHROSCOPY Bilateral    "2 on my right; 1 on the left"  . KNEE SURGERY  ~ 1982   "opened it up, cleaned it out after it had locked up"  . TONSILLECTOMY     "I think"  . TOTAL HIP ARTHROPLASTY Left 08/18/2015   Procedure: TOTAL HIP ARTHROPLASTY ANTERIOR APPROACH;  Surgeon: Dorna Leitz, MD;  Location: [facility];  Service: Orthopedics;  Laterality: Left;  . TOTAL KNEE ARTHROPLASTY Right 07/05/2016   Procedure: RIGHT TOTAL KNEE ARTHROPLASTY;  Surgeon: Dorna Leitz, MD;  Location: [facility];  Service: Orthopedics;  Laterality: Right;  . TOTAL KNEE ARTHROPLASTY Left 08/13/2016  . TOTAL KNEE ARTHROPLASTY Left 08/13/2016   Procedure: TOTAL KNEE ARTHROPLASTY;  Surgeon: Dorna Leitz, MD;  Location: [facility];  Service: Orthopedics;  Laterality: Left;  Marland Kitchen  VASECTOMY  1985    There were no vitals filed for this visit.       Subjective Assessment - 08/16/16 0839    Subjective Pt hada lt TKR on 08/13/2016 .  He was discharged home on 08/14/2016 and is coming directly to outpatient therapy.  He is currently keeping on his pain medication to keep his pain down as low as possible    Pertinent History Rt TKR on 07/05/2016; ( pt still has limited flexion and weak hip extension from this surgery) Lt THR 08/2015   Limitations Lifting;Standing;Walking;House hold activities;Sitting   How long can you walk comfortably? walking with walking for 30 minutes    Patient Stated Goals less pain, walk with no assistive device, return to work    Currently in Pain? Yes   Pain Score 2    Pain Location Knee   Pain Orientation Left   Pain Descriptors / Indicators Aching   Pain Type Surgical pain   Pain Onset In the past 7 days  Had DJD now surgical pain    Pain Frequency Intermittent   Aggravating Factors  bending    Pain Relieving Factors ice, medication    Effect of Pain on Daily Activities increases    Multiple Pain Sites No            [facility] PT Assessment - 08/16/16 0001  Assessment   Medical Diagnosis RT TKR   Referring Provider Dorna Leitz    Onset Date/Surgical Date 08/13/16   Hand Dominance Right   Next MD Visit 08/31/2016   Prior Therapy acute     Precautions   Precautions None     Restrictions   Weight Bearing Restrictions No     Balance Screen   Has the patient fallen in the past 6 months No   Has the patient had a decrease in activity level because of a fear of falling?  No   Is the patient reluctant to leave their home because of a fear of falling?  No     Prior Function   Level of Independence Independent   Vocation Full time employment   Contractor   Leisure walk, yard work, works on Public affairs consultant Status Within [employer] for tasks assessed     Observation/Other  Assessments   Focus on Therapeutic Outcomes (FOTO)  37; 63% limited      AROM   Right/Left Knee Left   Right Knee Extension 8   Right Knee Flexion 103   Left Knee Extension 6   Left Knee Flexion 62     Strength   Right/Left Hip Left   Right Hip Flexion 5/5   Right Hip Extension 3/5   Right Hip ABduction 4+/5   Left Hip Flexion 2-/5   Left Hip Extension 2/5   Left Hip ABduction 3+/5   Right/Left Knee Left   Right Knee Extension 5/5   Left Knee Flexion 4/5   Left Knee Extension 3-/5   Right/Left Ankle Left   Right Ankle Dorsiflexion 5/5   Left Ankle Dorsiflexion 5/5                           PT Education - 08/16/16 1608    Education provided Yes   Education Details Complete exercises given to pt when he had his Rt TKR.  When exercising if he has significant increased pain stop and ice for ten minutes.     Person(s) Educated Patient   Methods Explanation   Comprehension Verbalized understanding          PT Short Term Goals - 08/16/16 1619      PT SHORT TERM GOAL #1   Title Pt to be walking in his home with a cane   Time 2   Period Weeks   Status New     PT SHORT TERM GOAL #2   Title Pt ROM in both  knees to be four or less degrees to allow pt to ambulate with a normal gait pattern    Time 2   Period Weeks   Status New     PT SHORT TERM GOAL #3   Title Pt pain in his left knee to be no greater than a 2/10 with medication to allow pt to be weight bearing for 60 minutes straight without resting    Time 4   Period Weeks   Status New     PT SHORT TERM GOAL #4   Title Pt flexion of his both  knee to be to 105 to allow patient to ascend and descend steps with ease   Time 4   Period Weeks   Status New           PT Long Term Goals - 08/16/16 [id number]  PT LONG TERM GOAL #1   Title Pt to be walking without a cane inside and with a cane on uneven terrain.    Time 6   Period Weeks   Status New     PT LONG TERM GOAL #2   Title Pt ROM in  both knees to be to 120 degrees to be able to squat down to pick items off the ground and work in his yard.    Time 6   Period Weeks   Status New     PT LONG TERM GOAL #3   Title Pt SLS on both legs to be up to 30 seconds to allow pt to feel confident walking on uneven terrain without an assistive device.    Time 6     PT LONG TERM GOAL #4   Title Pt to be able to tolerate being weight bearing for an hour and a half without increased pain in either of  his knees   Time 6   Period Weeks   Status On-going               Plan - 08/16/16 1612    Clinical Impression Statement Mr. Manecke is a 65 yo male who has had a Lt TKR on 08/13/2016.  He is being referred to skilled out patient therapy to maximize his functional ability.  He has had a Rt TKR on 07/05/2016 and a Lt THR in September of 2016.  He typically is responsible for most of the household duties due to his wife having significant back pain.  He is currently walking with a walker, has decreased ROM, decreased strength, increased edema and increased pain. Mr. Sama will benefit from skilled physical therapy to address these issues and maximize his functional ability.    Rehab Potential Good   PT Frequency 3x / week   PT Duration 6 weeks   PT Treatment/Interventions ADLs/Self Care Home Management;Cryotherapy;Electrical Stimulation;Ultrasound;Gait training;Stair training;Functional mobility training;Therapeutic activities;Therapeutic exercise;Balance training;Patient/family education;Manual techniques;Passive range of motion   PT Next Visit Plan Begin all four SLR, supine hamstring stretch, heel raises and squats; progress to ambulating with cane to no assistive device.     PT Home Exercise Plan pt has exercises from previous TKR and is aware to begin these exercises.    Consulted and Agree with Plan of Care Patient      Patient will benefit from skilled therapeutic intervention in order to improve the following deficits and impairments:   Abnormal gait, Decreased activity tolerance, Decreased balance, Pain, Decreased strength, Difficulty walking, Increased edema, Increased fascial restricitons, Decreased range of motion  Visit Diagnosis: Pain in left knee - Plan: PT plan of care cert/re-cert  Difficulty in walking, not elsewhere classified - Plan: PT plan of care cert/re-cert  Unsteadiness on feet - Plan: PT plan of care cert/re-cert  Stiffness of left knee, not elsewhere classified - Plan: PT plan of care cert/re-cert  Stiffness of right knee, not elsewhere classified - Plan: PT plan of care cert/re-cert      G-Codes - [id number]    Functional Assessment Tool Used foto   Functional Limitation Mobility: Walking and moving around   Mobility: Walking and Moving Around Current Status [phone]) At least 60 percent but less than 80 percent impaired, limited or restricted   Mobility: Walking and Moving Around Goal Status [phone]) At least 40 percent but less than 60 percent impaired, limited or restricted       Problem List Patient Active Problem List  Diagnosis Date Noted  . S/P knee replacement 07/05/2016  . Primary osteoarthritis of left hip 08/18/2015  . Primary osteoarthritis of right knee 08/18/2015  . Primary osteoarthritis of left knee 08/18/2015  . History of osteoarthritis 10/07/2014  . Overweight(278.02) 05/16/2012    Rayetta Humphrey, PT CLT [phone] 08/16/2016, 4:32 PM  [facility] [address] [place], [place], [address] Phone: [phone]   Fax:  [phone]  Name: SHEMUEL MCCULLY MRN: [id number] Date of Birth: 02/19/1951

## 2016-08-16 NOTE — Addendum Note (Signed)
Addended by: Leeroy Cha on: 08/16/2016 04:38 PM   Modules accepted: Orders

## 2016-08-16 NOTE — Anesthesia Postprocedure Evaluation (Signed)
Anesthesia Post Note  Patient: Stanley Kelly  Procedure(s) Performed: Procedure(s) (LRB): TOTAL KNEE ARTHROPLASTY (Left)  Patient location during evaluation: PACU Anesthesia Type: Spinal Level of consciousness: awake Pain management: pain level controlled Vital Signs Assessment: post-procedure vital signs reviewed and stable Cardiovascular status: stable Postop Assessment: no signs of nausea or vomiting and spinal receding Anesthetic complications: no    Last Vitals:  Vitals:   08/13/16 1951 08/14/16 0458  BP: 134/72 124/69  Pulse: 97 74  Resp: 18 18  Temp: 37.4 C 37.1 C    Last Pain:  Vitals:   08/14/16 0458  TempSrc: Oral  PainSc:                  Loukas Antonson

## 2016-08-18 ENCOUNTER — Ambulatory Visit (HOSPITAL_COMMUNITY): Payer: Managed Care, Other (non HMO) | Admitting: Physical Therapy

## 2016-08-18 DIAGNOSIS — M25562 Pain in left knee: Secondary | ICD-10-CM

## 2016-08-18 DIAGNOSIS — M25661 Stiffness of right knee, not elsewhere classified: Secondary | ICD-10-CM

## 2016-08-18 DIAGNOSIS — M25662 Stiffness of left knee, not elsewhere classified: Secondary | ICD-10-CM

## 2016-08-18 DIAGNOSIS — R2681 Unsteadiness on feet: Secondary | ICD-10-CM

## 2016-08-18 DIAGNOSIS — M25561 Pain in right knee: Secondary | ICD-10-CM | POA: Diagnosis not present

## 2016-08-18 DIAGNOSIS — R262 Difficulty in walking, not elsewhere classified: Secondary | ICD-10-CM

## 2016-08-18 NOTE — Therapy (Signed)
[place], [place], [address] Phone: [phone]   Fax:  [phone]  Physical Therapy Treatment  Patient Details  Name: Stanley Kelly MRN: [id number] Date of Birth: [birth year] Referring Provider: Dorna Leitz   Encounter Date: 08/18/2016      PT End of Session - 08/18/16 0903    Visit Number 2   Number of Visits 18   Date for PT Re-Evaluation 09/15/16   Authorization Type [employer] Managed   Authorization - Visit Number 1   Authorization - Number of Visits 10   PT Start Time 0815   PT Stop Time 0904   PT Time Calculation (min) 49 min   Activity Tolerance Patient tolerated treatment well   Behavior During Therapy [facility] for tasks assessed/performed      Past Medical History:  Diagnosis Date  . Arthritis    "knees" (08/13/2016)  . GERD (gastroesophageal reflux disease)   . Kidney stones    "no OR" (08/13/2016)    Past Surgical History:  Procedure Laterality Date  . INJECTION KNEE Bilateral 08/18/2015   Procedure: BILATERAL TOTAL KNEE BILATERAL CORTISONE INJECTION;  Surgeon: Dorna Leitz, MD;  Location: [place];  Service: Orthopedics;  Laterality: Bilateral;  . JOINT REPLACEMENT    . KNEE ARTHROSCOPY Bilateral    "2 on my right; 1 on the left"  . KNEE SURGERY  ~ 1982   "opened it up, cleaned it out after it had locked up"  . TONSILLECTOMY     "I think"  . TOTAL HIP ARTHROPLASTY Left 08/18/2015   Procedure: TOTAL HIP ARTHROPLASTY ANTERIOR APPROACH;  Surgeon: Dorna Leitz, MD;  Location: [place];  Service: Orthopedics;  Laterality: Left;  . TOTAL KNEE ARTHROPLASTY Right 07/05/2016   Procedure: RIGHT TOTAL KNEE ARTHROPLASTY;  Surgeon: Dorna Leitz, MD;  Location: [place];  Service: Orthopedics;  Laterality: Right;  . TOTAL KNEE ARTHROPLASTY Left 08/13/2016  . TOTAL KNEE ARTHROPLASTY Left 08/13/2016   Procedure: TOTAL KNEE ARTHROPLASTY;  Surgeon: Dorna Leitz, MD;  Location: [place];  Service: Orthopedics;  Laterality: Left;  Marland Kitchen  VASECTOMY  1985    There were no vitals filed for this visit.      Subjective Assessment - 08/18/16 0823    Subjective Pt states that he had a really bad day yesterday.  His leg was swollen so bad he could not get into the CPM.  Iced all day long; feeling better today.  Insurance will only pay for 3 more visits.  After this pt will be self pay and will go down to once a week.    Pertinent History Rt TKR on 07/05/2016; ( pt still has limited flexion and weak hip extension from this surgery)   Limitations Lifting;Standing;Walking;House hold activities;Sitting   How long can you walk comfortably? walking with walking for 30 minutes    Patient Stated Goals less pain, walk with no assistive device, return to work    Currently in Pain? Yes   Pain Score 2    Pain Location Knee   Pain Orientation Left   Pain Descriptors / Indicators Aching   Pain Onset In the past 7 days  Had DJD now surgical pain                          [facility] Adult PT Treatment/Exercise - 08/18/16 0001      Ambulation/Gait   Ambulation Distance (Feet) 120 Feet   Assistive device Straight cane  Knee/Hip Exercises: Stretches   Active Hamstring Stretch Both;3 reps;20 seconds     Knee/Hip Exercises: Standing   Heel Raises Both;10 reps   Functional Squat 5 reps     Knee/Hip Exercises: Seated   Long Arc Quad AAROM;Left;5 reps   Heel Slides Left;5 reps     Knee/Hip Exercises: Supine   Quad Sets 15 reps   Short Arc Quad Sets Both;10 reps   Short Arc Quad Sets Limitations Rt with 5#    Heel Slides Both;10 reps   Heel Slides Limitations heel walks    Straight Leg Raises Left;10 reps   Straight Leg Raises Limitations AA     Manual Therapy   Manual Therapy Edema management   Manual therapy comments manual complete separate rest of treatment; complete in supine position   Edema Management decongestive massage for edema control      ice at end of treatment.  Not counted in time or charges            PT Education - 08/18/16 0901    Education provided Yes   Education Details Not to do so much that his pain increases significantly; To begin marching while sitting to improve hip flexion strength as pt needed max assis with SLR    Person(s) Educated Patient   Methods Explanation   Comprehension Verbalized understanding;Returned demonstration          PT Short Term Goals - 08/18/16 0937      PT SHORT TERM GOAL #1   Title Pt to be walking in his home with a cane   Time 2   Period Weeks   Status On-going     PT SHORT TERM GOAL #2   Title Pt ROM in both  knees to be four or less degrees to allow pt to ambulate with a normal gait pattern    Time 2   Period Weeks   Status On-going     PT SHORT TERM GOAL #3   Title Pt pain in his left knee to be no greater than a 2/10 with medication to allow pt to be weight bearing for 60 minutes straight without resting    Time 4   Period Weeks   Status On-going     PT SHORT TERM GOAL #4   Title Pt flexion of his both  knee to be to 105 to allow patient to ascend and descend steps with ease   Time 4   Period Weeks   Status On-going           PT Long Term Goals - 08/18/16 [id number]      PT LONG TERM GOAL #1   Title Pt to be walking without a cane inside and with a cane on uneven terrain.    Time 6   Period Weeks   Status On-going     PT LONG TERM GOAL #2   Title Pt ROM in both knees to be to 120 degrees to be able to squat down to pick items off the ground and work in his yard.    Time 6   Period Weeks   Status On-going     PT LONG TERM GOAL #3   Title Pt SLS on both legs to be up to 30 seconds to allow pt to feel confident walking on uneven terrain without an assistive device.    Time 6   Status On-going     PT LONG TERM GOAL #4   Title Pt to be able  to tolerate being weight bearing for an hour and a half without increased pain in either of  his knees   Time 6   Period Weeks   Status On-going                Plan - 08/18/16 0904    Clinical Impression Statement Unable to complete sidelying or prone SLR due to pt having significant increased pain with current exercises with noted increased swelling.  Pt will decrease to twice a week due to insurance limitation.  Added manual and Ice at end of session to decrease pain and swelling with good results.    Rehab Potential Good   PT Frequency 3x / week   PT Duration 6 weeks   PT Treatment/Interventions ADLs/Self Care Home Management;Cryotherapy;Electrical Stimulation;Ultrasound;Gait training;Stair training;Functional mobility training;Therapeutic activities;Therapeutic exercise;Balance training;Patient/family education;Manual techniques;Passive range of motion   PT Next Visit Plan Pt will need extensive home program each session as he will be doing most of his therapy at home.    PT Home Exercise Plan pt has exercises from previous TKR and is aware to begin these exercises.    Consulted and Agree with Plan of Care Patient      Patient will benefit from skilled therapeutic intervention in order to improve the following deficits and impairments:  Abnormal gait, Decreased activity tolerance, Decreased balance, Pain, Decreased strength, Difficulty walking, Increased edema, Increased fascial restricitons, Decreased range of motion  Visit Diagnosis: Pain in left knee  Difficulty in walking, not elsewhere classified  Unsteadiness on feet  Stiffness of left knee, not elsewhere classified  Stiffness of right knee, not elsewhere classified     Problem List Patient Active Problem List   Diagnosis Date Noted  . S/P knee replacement 07/05/2016  . Primary osteoarthritis of left hip 08/18/2015  . Primary osteoarthritis of right knee 08/18/2015  . Primary osteoarthritis of left knee 08/18/2015  . History of osteoarthritis 10/07/2014  . Overweight(278.02) 05/16/2012    Rayetta Humphrey, PT CLT [phone] 08/18/2016, 9:42 AM  [facility] [address] [place], [place], [address] Phone: [phone]   Fax:  [phone]  Name: Stanley Kelly MRN: [id number] Date of Birth: [birth year]

## 2016-08-19 ENCOUNTER — Encounter (HOSPITAL_COMMUNITY): Payer: Managed Care, Other (non HMO)

## 2016-08-19 ENCOUNTER — Encounter (HOSPITAL_COMMUNITY): Payer: Managed Care, Other (non HMO) | Admitting: Physical Therapy

## 2016-08-23 ENCOUNTER — Encounter (HOSPITAL_COMMUNITY): Payer: Managed Care, Other (non HMO) | Admitting: Physical Therapy

## 2016-08-25 ENCOUNTER — Ambulatory Visit (HOSPITAL_COMMUNITY): Payer: Managed Care, Other (non HMO) | Admitting: Physical Therapy

## 2016-08-25 DIAGNOSIS — M25561 Pain in right knee: Secondary | ICD-10-CM | POA: Diagnosis not present

## 2016-08-25 DIAGNOSIS — R262 Difficulty in walking, not elsewhere classified: Secondary | ICD-10-CM

## 2016-08-25 DIAGNOSIS — R2681 Unsteadiness on feet: Secondary | ICD-10-CM

## 2016-08-25 DIAGNOSIS — M25562 Pain in left knee: Secondary | ICD-10-CM

## 2016-08-25 NOTE — Therapy (Signed)
[place], [place], [address] Phone: [phone]   Fax:  [phone]  Physical Therapy Treatment  Patient Details  Name: Stanley Kelly MRN: [id number] Date of Birth: 1951/10/01 Referring Provider: Dorna Leitz   Encounter Date: 08/25/2016      PT End of Session - 08/25/16 0837    Visit Number 3   Number of Visits 7   Date for PT Re-Evaluation 09/15/16   Authorization Type [employer] Managed   Authorization Time Period only has 20 visits total per year   Authorization - Visit Number 17   Authorization - Number of Visits 20   PT Start Time 0815   PT Stop Time 0900   PT Time Calculation (min) 45 min   Activity Tolerance Patient tolerated treatment well   Behavior During Therapy [facility] for tasks assessed/performed      Past Medical History:  Diagnosis Date  . Arthritis    "knees" (08/13/2016)  . GERD (gastroesophageal reflux disease)   . Kidney stones    "no OR" (08/13/2016)    Past Surgical History:  Procedure Laterality Date  . INJECTION KNEE Bilateral 08/18/2015   Procedure: BILATERAL TOTAL KNEE BILATERAL CORTISONE INJECTION;  Surgeon: Dorna Leitz, MD;  Location: [place];  Service: Orthopedics;  Laterality: Bilateral;  . JOINT REPLACEMENT    . KNEE ARTHROSCOPY Bilateral    "2 on my right; 1 on the left"  . KNEE SURGERY  ~ 1982   "opened it up, cleaned it out after it had locked up"  . TONSILLECTOMY     "I think"  . TOTAL HIP ARTHROPLASTY Left 08/18/2015   Procedure: TOTAL HIP ARTHROPLASTY ANTERIOR APPROACH;  Surgeon: Dorna Leitz, MD;  Location: [facility] [place];  Service: Orthopedics;  Laterality: Left;  . TOTAL KNEE ARTHROPLASTY Right 07/05/2016   Procedure: RIGHT TOTAL KNEE ARTHROPLASTY;  Surgeon: Dorna Leitz, MD;  Location: [place];  Service: Orthopedics;  Laterality: Right;  . TOTAL KNEE ARTHROPLASTY Left 08/13/2016  . TOTAL KNEE ARTHROPLASTY Left 08/13/2016   Procedure: TOTAL KNEE ARTHROPLASTY;  Surgeon: Dorna Leitz, MD;   Location: [place];  Service: Orthopedics;  Laterality: Left;  Marland Kitchen VASECTOMY  1985    There were no vitals filed for this visit.      Subjective Assessment - 08/25/16 0821    Subjective Pt states he thinks he overdone it Sunday as he walked all day without SPC.  STates it was blown up Monday and Tueday and kept ice on it all day.   Currently in Pain? Yes   Pain Score 2    Pain Location Knee   Pain Orientation Left   Pain Descriptors / Indicators Aching   Pain Type Surgical pain   Pain Frequency Intermittent                         [facility] Adult PT Treatment/Exercise - 08/25/16 0001      Knee/Hip Exercises: Stretches   Knee: Self-Stretch to increase Flexion Right;10 seconds   Knee: Self-Stretch Limitations 10 reps   Gastroc Stretch Both;3 reps;30 seconds   Gastroc Stretch Limitations slant board     Knee/Hip Exercises: Supine   Quad Sets 15 reps   Heel Slides Both;10 reps   Heel Slides Limitations heel walks    Straight Leg Raises Left;10 reps   Straight Leg Raises Limitations AA     Manual Therapy   Manual Therapy Edema management;Myofascial release   Manual therapy comments  manual complete separate rest of treatment; complete in supine position   Edema Management retro    Joint Mobilization AAROM for flexion, distraction   Soft tissue mobilization scar tissue mobilization/instruction   Myofascial Release adhesions perimeter of knee to increase ROM                  PT Short Term Goals - 08/18/16 0937      PT SHORT TERM GOAL #1   Title Pt to be walking in his home with a cane   Time 2   Period Weeks   Status On-going     PT SHORT TERM GOAL #2   Title Pt ROM in both  knees to be four or less degrees to allow pt to ambulate with a normal gait pattern    Time 2   Period Weeks   Status On-going     PT SHORT TERM GOAL #3   Title Pt pain in his left knee to be no greater than a 2/10 with medication to allow pt to be weight bearing for 60 minutes  straight without resting    Time 4   Period Weeks   Status On-going     PT SHORT TERM GOAL #4   Title Pt flexion of his both  knee to be to 105 to allow patient to ascend and descend steps with ease   Time 4   Period Weeks   Status On-going           PT Long Term Goals - 08/18/16 [id number]      PT LONG TERM GOAL #1   Title Pt to be walking without a cane inside and with a cane on uneven terrain.    Time 6   Period Weeks   Status On-going     PT LONG TERM GOAL #2   Title Pt ROM in both knees to be to 120 degrees to be able to squat down to pick items off the ground and work in his yard.    Time 6   Period Weeks   Status On-going     PT LONG TERM GOAL #3   Title Pt SLS on both legs to be up to 30 seconds to allow pt to feel confident walking on uneven terrain without an assistive device.    Time 6   Status On-going     PT LONG TERM GOAL #4   Title Pt to be able to tolerate being weight bearing for an hour and a half without increased pain in either of  his knees   Time 6   Period Weeks   Status On-going               Plan - 08/25/16 [id number]    Clinical Impression Statement continued with focus on manual and AAROM to progress with gains in motion.  Pt with more sensitivity on medial aspect of knee and noted tightness in this area as well as superior lateral aspect.  Completed myofascial techniques but unable to produce inreased ROM with max of 78 degrees flexion.  Encouraged patient to focus mainly on improving his ROM.    Rehab Potential Good   PT Frequency 3x / week   PT Duration 6 weeks   PT Treatment/Interventions ADLs/Self Care Home Management;Cryotherapy;Electrical Stimulation;Ultrasound;Gait training;Stair training;Functional mobility training;Therapeutic activities;Therapeutic exercise;Balance training;Patient/family education;Manual techniques;Passive range of motion   PT Next Visit Plan Continue 1X week for 3 more weeks then transition to home.  May revisit JAS  if  ROM is not where expected in next couple weeks.  Continue to focus on ROM.   Consulted and Agree with Plan of Care Patient      Patient will benefit from skilled therapeutic intervention in order to improve the following deficits and impairments:  Abnormal gait, Decreased activity tolerance, Decreased balance, Pain, Decreased strength, Difficulty walking, Increased edema, Increased fascial restricitons, Decreased range of motion  Visit Diagnosis: Pain in left knee  Difficulty in walking, not elsewhere classified  Unsteadiness on feet     Problem List Patient Active Problem List   Diagnosis Date Noted  . S/P knee replacement 07/05/2016  . Primary osteoarthritis of left hip 08/18/2015  . Primary osteoarthritis of right knee 08/18/2015  . Primary osteoarthritis of left knee 08/18/2015  . History of osteoarthritis 10/07/2014  . Overweight(278.02) 05/16/2012    Teena Irani, PTA/CLT [phone]   08/25/2016, 9:35 AM  [facility] [address] [place], [place], [address] Phone: [phone]   Fax:  [phone]  Name: Stanley Kelly MRN: [id number] Date of Birth: 1951/12/03

## 2016-08-27 ENCOUNTER — Encounter (HOSPITAL_COMMUNITY): Payer: Managed Care, Other (non HMO)

## 2016-08-31 ENCOUNTER — Encounter (HOSPITAL_COMMUNITY): Payer: Managed Care, Other (non HMO)

## 2016-09-01 ENCOUNTER — Ambulatory Visit (HOSPITAL_COMMUNITY): Payer: Managed Care, Other (non HMO)

## 2016-09-01 DIAGNOSIS — R262 Difficulty in walking, not elsewhere classified: Secondary | ICD-10-CM

## 2016-09-01 DIAGNOSIS — M25562 Pain in left knee: Secondary | ICD-10-CM

## 2016-09-01 DIAGNOSIS — M25561 Pain in right knee: Secondary | ICD-10-CM | POA: Diagnosis not present

## 2016-09-01 DIAGNOSIS — M25662 Stiffness of left knee, not elsewhere classified: Secondary | ICD-10-CM

## 2016-09-01 DIAGNOSIS — R2681 Unsteadiness on feet: Secondary | ICD-10-CM

## 2016-09-01 NOTE — Therapy (Signed)
[facility] [address] [place], [place], [address] Phone: [phone]   Fax:  [phone]  Physical Therapy Treatment  Patient Details  Name: Stanley Kelly MRN: [id number] Date of Birth: 1951/01/16 Referring Provider: Dorna Leitz   Encounter Date: 09/01/2016      PT End of Session - 09/01/16 0821    Visit Number 4   Number of Visits 7   Date for PT Re-Evaluation 09/15/16   Authorization Type [employer] Managed   Authorization Time Period only has 20 visits total per year   Authorization - Visit Number 18   Authorization - Number of Visits 20   PT Start Time 0806  Began on Nustep unsupervised for 10 min prior session   PT Stop Time 0859   PT Time Calculation (min) 53 min   Activity Tolerance Patient tolerated treatment well   Behavior During Therapy [facility] for tasks assessed/performed      Past Medical History:  Diagnosis Date  . Arthritis    "knees" (08/13/2016)  . GERD (gastroesophageal reflux disease)   . Kidney stones    "no OR" (08/13/2016)    Past Surgical History:  Procedure Laterality Date  . INJECTION KNEE Bilateral 08/18/2015   Procedure: BILATERAL TOTAL KNEE BILATERAL CORTISONE INJECTION;  Surgeon: Dorna Leitz, MD;  Location: [place];  Service: Orthopedics;  Laterality: Bilateral;  . JOINT REPLACEMENT    . KNEE ARTHROSCOPY Bilateral    "2 on my right; 1 on the left"  . KNEE SURGERY  ~ 1982   "opened it up, cleaned it out after it had locked up"  . TONSILLECTOMY     "I think"  . TOTAL HIP ARTHROPLASTY Left 08/18/2015   Procedure: TOTAL HIP ARTHROPLASTY ANTERIOR APPROACH;  Surgeon: Dorna Leitz, MD;  Location: [facility];  Service: Orthopedics;  Laterality: Left;  . TOTAL KNEE ARTHROPLASTY Right 07/05/2016   Procedure: RIGHT TOTAL KNEE ARTHROPLASTY;  Surgeon: Dorna Leitz, MD;  Location: [facility];  Service: Orthopedics;  Laterality: Right;  . TOTAL KNEE ARTHROPLASTY Left 08/13/2016  . TOTAL KNEE ARTHROPLASTY Left 08/13/2016   Procedure:  TOTAL KNEE ARTHROPLASTY;  Surgeon: Dorna Leitz, MD;  Location: [facility];  Service: Orthopedics;  Laterality: Left;  Marland Kitchen VASECTOMY  1985    There were no vitals filed for this visit.      Subjective Assessment - 09/01/16 0819    Subjective Pt stated knee is tight today, no reports of pain today.  Pt entered dept with no AD.  Reports compliance with HEP.     Pertinent History Rt TKR on 07/05/2016; ( pt still has limited flexion and weak hip extension from this surgery)   Patient Stated Goals less pain, walk with no assistive device, return to work    Currently in Pain? No/denies   Pain Descriptors / Indicators Tightness            [facility] Adult PT Treatment/Exercise - 09/01/16 0001      Knee/Hip Exercises: Stretches   Active Hamstring Stretch Both;3 reps;20 seconds   Active Hamstring Stretch Limitations supine with rope   Quad Stretch Left;3 reps;30 seconds   Quad Stretch Limitations prone with rope   Knee: Self-Stretch to increase Flexion Right;10 seconds   Knee: Self-Stretch Limitations 10 reps on 12in step     Knee/Hip Exercises: Standing   Functional Squat 2 sets;5 reps   Functional Squat Limitations for knee range     Knee/Hip Exercises: Supine   Heel Slides AAROM;Left;10 reps  Heel Slides Limitations AAROM with rope 83 degrees flexion     Manual Therapy   Manual Therapy Edema management;Myofascial release   Manual therapy comments manual complete separate rest of treatment; complete in supine position   Edema Management retro massage to decrease edema with LE elevated   Soft tissue mobilization scar tissue mobilization/instruction   Myofascial Release adhesions perimeter of knee to increase ROM                PT Education - 09/01/16 0855    Education provided Yes   Education Details Instructed scar tissue massage, able to verbalize and demonstrate appropriate technique following   Person(s) Educated Patient   Methods Explanation;Demonstration   Comprehension  Verbalized understanding;Returned demonstration          PT Short Term Goals - 08/18/16 0937      PT SHORT TERM GOAL #1   Title Pt to be walking in his home with a cane   Time 2   Period Weeks   Status On-going     PT SHORT TERM GOAL #2   Title Pt ROM in both  knees to be four or less degrees to allow pt to ambulate with a normal gait pattern    Time 2   Period Weeks   Status On-going     PT SHORT TERM GOAL #3   Title Pt pain in his left knee to be no greater than a 2/10 with medication to allow pt to be weight bearing for 60 minutes straight without resting    Time 4   Period Weeks   Status On-going     PT SHORT TERM GOAL #4   Title Pt flexion of his both  knee to be to 105 to allow patient to ascend and descend steps with ease   Time 4   Period Weeks   Status On-going           PT Long Term Goals - 08/18/16 [id number]      PT LONG TERM GOAL #1   Title Pt to be walking without a cane inside and with a cane on uneven terrain.    Time 6   Period Weeks   Status On-going     PT LONG TERM GOAL #2   Title Pt ROM in both knees to be to 120 degrees to be able to squat down to pick items off the ground and work in his yard.    Time 6   Period Weeks   Status On-going     PT LONG TERM GOAL #3   Title Pt SLS on both legs to be up to 30 seconds to allow pt to feel confident walking on uneven terrain without an assistive device.    Time 6   Status On-going     PT LONG TERM GOAL #4   Title Pt to be able to tolerate being weight bearing for an hour and a half without increased pain in either of  his knees   Time 6   Period Weeks   Status On-going               Plan - 09/01/16 0859    Clinical Impression Statement Session focus on improving ROM with manual, therex and stretches.  Began session with manual myofascial release technqiues to scar tissue to reduce adhesions, pt educated on technique to begin at home wiht ability to verbalized and demonstrate appropriate  technique.  Pt continues to have increased adhesions on medial  aspect of incision.  Ended session with retro massage for edema control with ice on posterior knee for pain and edema control.  Improved AROM to 87 degrees flexion at EOS.     Rehab Potential Good   Clinical Impairments Affecting Rehab Potential Painful Lt knee; having TKA on Lt knee first week in September   PT Frequency 3x / week   PT Duration 6 weeks   PT Treatment/Interventions ADLs/Self Care Home Management;Cryotherapy;Electrical Stimulation;Ultrasound;Gait training;Stair training;Functional mobility training;Therapeutic activities;Therapeutic exercise;Balance training;Patient/family education;Manual techniques;Passive range of motion   PT Next Visit Plan Continue 1X week for 3 more weeks then transition to home.  May revisit JAS if ROM is not where expected in next couple weeks.  Continue to focus on ROM.   PT Home Exercise Plan pt has exercises from previous TKR and is aware to begin these exercises.       Patient will benefit from skilled therapeutic intervention in order to improve the following deficits and impairments:  Abnormal gait, Decreased activity tolerance, Decreased balance, Pain, Decreased strength, Difficulty walking, Increased edema, Increased fascial restricitons, Decreased range of motion  Visit Diagnosis: Pain in left knee  Difficulty in walking, not elsewhere classified  Unsteadiness on feet  Stiffness of left knee, not elsewhere classified     Problem List Patient Active Problem List   Diagnosis Date Noted  . S/P knee replacement 07/05/2016  . Primary osteoarthritis of left hip 08/18/2015  . Primary osteoarthritis of right knee 08/18/2015  . Primary osteoarthritis of left knee 08/18/2015  . History of osteoarthritis 10/07/2014  . Overweight(278.02) 05/16/2012   Ihor [place], [place]; [place]  Aldona Lento 09/01/2016, [place], [place], [address] Phone: [phone]   Fax:  [phone]  Name: Stanley Kelly MRN: [id number] Date of Birth: 26-Jun-1951

## 2016-09-03 ENCOUNTER — Encounter (HOSPITAL_COMMUNITY): Payer: Managed Care, Other (non HMO)

## 2016-09-08 ENCOUNTER — Ambulatory Visit (HOSPITAL_COMMUNITY): Payer: Managed Care, Other (non HMO) | Attending: Orthopedic Surgery

## 2016-09-08 DIAGNOSIS — M25562 Pain in left knee: Secondary | ICD-10-CM | POA: Diagnosis not present

## 2016-09-08 DIAGNOSIS — R2681 Unsteadiness on feet: Secondary | ICD-10-CM | POA: Diagnosis present

## 2016-09-08 DIAGNOSIS — M25662 Stiffness of left knee, not elsewhere classified: Secondary | ICD-10-CM | POA: Insufficient documentation

## 2016-09-08 DIAGNOSIS — R262 Difficulty in walking, not elsewhere classified: Secondary | ICD-10-CM | POA: Insufficient documentation

## 2016-09-08 NOTE — Therapy (Signed)
[facility] [address] [place], [place], [address] Phone: [phone]   Fax:  [phone]  Physical Therapy Treatment  Patient Details  Name: Stanley Kelly MRN: [id number] Date of Birth: [birth year] Referring Provider: Dorna Leitz   Encounter Date: 09/08/2016      PT End of Session - 09/08/16 0815    Visit Number 5   Number of Visits 7   Date for PT Re-Evaluation 09/15/16   Authorization Type [employer] Managed   Authorization Time Period only has 20 visits total per year   PT Start Time 0809  Began on Nustep unsupervised until 0816   PT Stop Time 0902   PT Time Calculation (min) 53 min   Activity Tolerance Patient tolerated treatment well   Behavior During Therapy [facility] for tasks assessed/performed      Past Medical History:  Diagnosis Date  . Arthritis    "knees" (08/13/2016)  . GERD (gastroesophageal reflux disease)   . Kidney stones    "no OR" (08/13/2016)    Past Surgical History:  Procedure Laterality Date  . INJECTION KNEE Bilateral 08/18/2015   Procedure: BILATERAL TOTAL KNEE BILATERAL CORTISONE INJECTION;  Surgeon: Dorna Leitz, MD;  Location: [facility];  Service: Orthopedics;  Laterality: Bilateral;  . JOINT REPLACEMENT    . KNEE ARTHROSCOPY Bilateral    "2 on my right; 1 on the left"  . KNEE SURGERY  ~ 1982   "opened it up, cleaned it out after it had locked up"  . TONSILLECTOMY     "I think"  . TOTAL HIP ARTHROPLASTY Left 08/18/2015   Procedure: TOTAL HIP ARTHROPLASTY ANTERIOR APPROACH;  Surgeon: Dorna Leitz, MD;  Location: [facility];  Service: Orthopedics;  Laterality: Left;  . TOTAL KNEE ARTHROPLASTY Right 07/05/2016   Procedure: RIGHT TOTAL KNEE ARTHROPLASTY;  Surgeon: Dorna Leitz, MD;  Location: [place];  Service: Orthopedics;  Laterality: Right;  . TOTAL KNEE ARTHROPLASTY Left 08/13/2016  . TOTAL KNEE ARTHROPLASTY Left 08/13/2016   Procedure: TOTAL KNEE ARTHROPLASTY;  Surgeon: Dorna Leitz, MD;  Location: [facility];  Service:  Orthopedics;  Laterality: Left;  Marland Kitchen VASECTOMY  1985    There were no vitals filed for this visit.      Subjective Assessment - 09/08/16 0814    Subjective Pt stated he over did it last week, current pain scale 4-5/10 Lt knee very achey.     Pertinent History Rt TKR on 07/05/2016; ( pt still has limited flexion and weak hip extension from this surgery)   Patient Stated Goals less pain, walk with no assistive device, return to work    Currently in Pain? Yes   Pain Score 5    Pain Location Knee   Pain Orientation Left   Pain Descriptors / Indicators Aching   Pain Type Surgical pain   Pain Radiating Towards none   Pain Onset In the past 7 days   Pain Frequency Intermittent   Aggravating Factors  bending   Pain Relieving Factors ice, medication   Effect of Pain on Daily Activities increases               [facility] Adult PT Treatment/Exercise - 09/08/16 0001      Ambulation/Gait   Ambulation/Gait Yes   Ambulation/Gait Assistance 6: Modified independent (Device/Increase time)   Ambulation Distance (Feet) 452 Feet   Assistive device None   Gait Pattern Step-through pattern;Decreased arm swing - right;Decreased stance time - left;Decreased stride length;Decreased hip/knee flexion - left;Decreased dorsiflexion - left  Knee/Hip Exercises: Stretches   Knee: Self-Stretch to increase Flexion Right;10 seconds   Knee: Self-Stretch Limitations 10 reps on 12in step     Knee/Hip Exercises: Standing   Functional Squat 10 reps   Functional Squat Limitations for knee range   Rocker Board 2 minutes   Rocker Board Limitations R/L and A/P   Gait Training no AD 2x 217ft cueing for roll to toes to increase knee flexion     Knee/Hip Exercises: Supine   Heel Slides Limitations AAROM with rope 87 degrees flexion     Manual Therapy   Manual Therapy Edema management;Myofascial release   Manual therapy comments manual complete separate rest of treatment; complete in supine position   Joint  Mobilization patella mobs all directionss; tib/fib mobs   Soft tissue mobilization scar tissue mobilization/instruction   Myofascial Release adhesions perimeter of knee to increase ROM                  PT Short Term Goals - 08/18/16 0937      PT SHORT TERM GOAL #1   Title Pt to be walking in his home with a cane   Time 2   Period Weeks   Status On-going     PT SHORT TERM GOAL #2   Title Pt ROM in both  knees to be four or less degrees to allow pt to ambulate with a normal gait pattern    Time 2   Period Weeks   Status On-going     PT SHORT TERM GOAL #3   Title Pt pain in his left knee to be no greater than a 2/10 with medication to allow pt to be weight bearing for 60 minutes straight without resting    Time 4   Period Weeks   Status On-going     PT SHORT TERM GOAL #4   Title Pt flexion of his both  knee to be to 105 to allow patient to ascend and descend steps with ease   Time 4   Period Weeks   Status On-going           PT Long Term Goals - 08/18/16 [id number]      PT LONG TERM GOAL #1   Title Pt to be walking without a cane inside and with a cane on uneven terrain.    Time 6   Period Weeks   Status On-going     PT LONG TERM GOAL #2   Title Pt ROM in both knees to be to 120 degrees to be able to squat down to pick items off the ground and work in his yard.    Time 6   Period Weeks   Status On-going     PT LONG TERM GOAL #3   Title Pt SLS on both legs to be up to 30 seconds to allow pt to feel confident walking on uneven terrain without an assistive device.    Time 6   Status On-going     PT LONG TERM GOAL #4   Title Pt to be able to tolerate being weight bearing for an hour and a half without increased pain in either of  his knees   Time 6   Period Weeks   Status On-going               Plan - 09/08/16 0910    Clinical Impression Statement Began session on Nustep unsupervised for knee mobility initial session with manual myofascial release  technqiues following bike.  Overall knee incision making improvements with pt completeing manual independently at home following instructions last session.  Knee continues to have myofascial restrictions on lateral aspect of knee as well as moderate edema present.  Pt stated pain reduced to 0-2/10 following manual. Therex focus on improving knee mobility and gait mechanics. Verbal cueing to improve weight distrubtion and increase knee flexion at toe push off.  EOS improved knee flexion to 88 degrees with no reports of pain following.   Discussion held with pt about possible [facility] referral, pt stated he has communicated with insurance and does not feel will be covered.    Rehab Potential Good   Clinical Impairments Affecting Rehab Potential Painful Lt knee; having TKA on Lt knee first week in September   PT Duration 6 weeks   PT Treatment/Interventions ADLs/Self Care Home Management;Cryotherapy;Electrical Stimulation;Ultrasound;Gait training;Stair training;Functional mobility training;Therapeutic activities;Therapeutic exercise;Balance training;Patient/family education;Manual techniques;Passive range of motion   PT Next Visit Plan Reassess next session.  May revisit JAS if ROM is not where expected in next couple weeks.  Continue to focus on ROM.      Patient will benefit from skilled therapeutic intervention in order to improve the following deficits and impairments:  Abnormal gait, Decreased activity tolerance, Decreased balance, Pain, Decreased strength, Difficulty walking, Increased edema, Increased fascial restricitons, Decreased range of motion  Visit Diagnosis: Acute pain of left knee  Difficulty in walking, not elsewhere classified  Unsteadiness on feet  Stiffness of left knee, not elsewhere classified     Problem List Patient Active Problem List   Diagnosis Date Noted  . S/P knee replacement 07/05/2016  . Primary osteoarthritis of left hip 08/18/2015  . Primary osteoarthritis of  right knee 08/18/2015  . Primary osteoarthritis of left knee 08/18/2015  . History of osteoarthritis 10/07/2014  . Overweight(278.02) 05/16/2012   Ihor Austin, [place]; [place]  Aldona Lento 09/08/2016, 9:50 AM  [place], [place], [address] Phone: [phone]   Fax:  [phone]  Name: EVERTH LAPINE MRN: [id number] Date of Birth: [birth year]

## 2016-09-15 ENCOUNTER — Ambulatory Visit (HOSPITAL_COMMUNITY): Payer: Managed Care, Other (non HMO) | Admitting: Physical Therapy

## 2016-09-15 DIAGNOSIS — R262 Difficulty in walking, not elsewhere classified: Secondary | ICD-10-CM

## 2016-09-15 DIAGNOSIS — M25562 Pain in left knee: Secondary | ICD-10-CM

## 2016-09-15 DIAGNOSIS — R2681 Unsteadiness on feet: Secondary | ICD-10-CM

## 2016-09-15 DIAGNOSIS — M25662 Stiffness of left knee, not elsewhere classified: Secondary | ICD-10-CM

## 2016-09-15 NOTE — Therapy (Signed)
[place], [place], [address] Phone: [phone]   Fax:  [phone]  Physical Therapy Treatment  Patient Details  Name: Stanley Kelly MRN: [id number] Date of Birth: July 29, 1951 Referring Provider: Dorna Leitz   Encounter Date: 09/15/2016      PT End of Session - 09/15/16 0843    Visit Number 6   Number of Visits 6   Date for PT Re-Evaluation 09/15/16   Authorization Type [employer] Managed   Authorization Time Period only has 20 visits total per year   PT Start Time 0805   PT Stop Time 0850   PT Time Calculation (min) 45 min   Activity Tolerance Patient tolerated treatment well   Behavior During Therapy [facility] for tasks assessed/performed      Past Medical History:  Diagnosis Date  . Arthritis    "knees" (08/13/2016)  . GERD (gastroesophageal reflux disease)   . Kidney stones    "no OR" (08/13/2016)    Past Surgical History:  Procedure Laterality Date  . INJECTION KNEE Bilateral 08/18/2015   Procedure: BILATERAL TOTAL KNEE BILATERAL CORTISONE INJECTION;  Surgeon: Dorna Leitz, MD;  Location: [place];  Service: Orthopedics;  Laterality: Bilateral;  . JOINT REPLACEMENT    . KNEE ARTHROSCOPY Bilateral    "2 on my right; 1 on the left"  . KNEE SURGERY  ~ 1982   "opened it up, cleaned it out after it had locked up"  . TONSILLECTOMY     "I think"  . TOTAL HIP ARTHROPLASTY Left 08/18/2015   Procedure: TOTAL HIP ARTHROPLASTY ANTERIOR APPROACH;  Surgeon: Dorna Leitz, MD;  Location: [place];  Service: Orthopedics;  Laterality: Left;  . TOTAL KNEE ARTHROPLASTY Right 07/05/2016   Procedure: RIGHT TOTAL KNEE ARTHROPLASTY;  Surgeon: Dorna Leitz, MD;  Location: [place];  Service: Orthopedics;  Laterality: Right;  . TOTAL KNEE ARTHROPLASTY Left 08/13/2016  . TOTAL KNEE ARTHROPLASTY Left 08/13/2016   Procedure: TOTAL KNEE ARTHROPLASTY;  Surgeon: Dorna Leitz, MD;  Location: [facility];  Service: Orthopedics;  Laterality: Left;  Marland Kitchen VASECTOMY   1985    There were no vitals filed for this visit.      Subjective Assessment - 09/15/16 0819    Subjective Pt states that he still swells quickly after activity.  He is having difficulty without his medication.     Pertinent History Rt TKR on 07/05/2016; ( pt still has limited flexion and weak hip extension from this surgery)   Limitations Lifting;Standing;Walking;House hold activities;Sitting   How long can you sit comfortably? no problem    How long can you stand comfortably? as long as he wants    How long can you walk comfortably? walking without an assistive device for 40 minutest; was walking with walking for 30 minutes    Patient Stated Goals less pain, walk with no assistive device, return to work    Currently in Pain? --  pt is no longer on pain medication    Pain Score 1    Pain Location Knee   Pain Orientation Left   Pain Descriptors / Indicators Aching   Pain Onset In the past 7 days  Had DJD now surgical pain    Aggravating Factors  trying to bend his knee    Pain Relieving Factors ice            [facility] PT Assessment - 09/15/16 0001      Assessment   Medical Diagnosis RT TKR   Referring  Provider Dorna Leitz    Onset Date/Surgical Date 08/13/16   Hand Dominance Right   Next MD Visit 09/21/2016   Prior Therapy acute     Precautions   Precautions None     Restrictions   Weight Bearing Restrictions No     Balance Screen   Has the patient fallen in the past 6 months No   Has the patient had a decrease in activity level because of a fear of falling?  No   Is the patient reluctant to leave their home because of a fear of falling?  No     Prior Function   Level of Independence Independent   Vocation Full time employment   Contractor   Leisure walk, yard work, works on Public affairs consultant Status Within [employer] for tasks assessed     Observation/Other Assessments   Focus on Therapeutic Outcomes (FOTO)  69   Or 31 % limites was; 37 or  63% limited      AROM   Right Knee Extension 0   Right Knee Flexion 106   Left Knee Extension 4  was 6   Left Knee Flexion 102  was 62     Strength   Right Hip Flexion 5/5   Right Hip Extension 3+/5  ws 3/5    Right Hip ABduction 5/5  was 4+/5    Left Hip Flexion 3+/5  was 2-/5    Left Hip Extension 3/5  was 5/5    Left Hip ABduction 5/5  was 3+/5    Right Knee Extension 5/5   Left Knee Flexion 5/5  was 4/5   Left Knee Extension 5/5  was 3-/5    Right Ankle Dorsiflexion 5/5   Left Ankle Dorsiflexion 5/5                     [facility] Adult PT Treatment/Exercise - 09/15/16 0001      Knee/Hip Exercises: Standing   Heel Raises Both;10 reps   Functional Squat 10 reps   Wall Squat 5 reps   SLS x2 bilateral    Other Standing Knee Exercises --     Knee/Hip Exercises: Supine   Quad Sets Both;10 reps     Knee/Hip Exercises: Prone   Hip Extension Strengthening;Both;10 reps                PT Education - 09/15/16 0855    Education provided Yes   Education Details The importance of strengthening gluteus maximus; simulation exercises to get into his semi for work; new exercise Theatre manager) Educated Patient   Methods Explanation;Handout   Comprehension Returned demonstration;Verbalized understanding          PT Short Term Goals - 09/15/16 0844      PT SHORT TERM GOAL #1   Title Pt to be walking in his home with a cane   Time 2   Period Weeks   Status Achieved     PT SHORT TERM GOAL #2   Title Pt ROM in both  knees to be four or less degrees to allow pt to ambulate with a normal gait pattern    Time 2   Period Weeks   Status Achieved     PT SHORT TERM GOAL #3   Title Pt pain in his left knee to be no greater than a 2/10 with medication to allow pt to be weight bearing for 60  minutes straight without resting    Time 4   Period Weeks   Status Achieved     PT SHORT TERM GOAL #4   Title Pt flexion of his both   knee to be to 105 to allow patient to ascend and descend steps with ease   Time 4   Period Weeks   Status Partially Met           PT Long Term Goals - 09/15/16 0845      PT LONG TERM GOAL #1   Title Pt to be walking without a cane inside and with a cane on uneven terrain.    Time 6   Period Weeks   Status Achieved     PT LONG TERM GOAL #2   Title Pt ROM in both knees to be to 120 degrees to be able to squat down to pick items off the ground and work in his yard.    Time 6   Period Weeks   Status On-going     PT LONG TERM GOAL #3   Title Pt SLS on both legs to be up to 30 seconds to allow pt to feel confident walking on uneven terrain without an assistive device.    Time 6   Status Achieved     PT LONG TERM GOAL #4   Title Pt to be able to tolerate being weight bearing for an hour and a half without increased pain in either of  his knees   Time 6   Period Weeks   Status Partially Met               Plan - 09/15/16 0857    Clinical Impression Statement Pt reassessed with great gains in ROM, balance and strength.  Pt still has limitation in hip flexor and extensor strength and was given exercises to work on these deficits.  Pt knows exercises for ROM.   Pt insurance limitation has ran out and both therapist and pt feels that pt is ready to be discharge to work on activities at home.  Pt warned NOT to get into hot tub until his knee is 100% healed    Rehab Potential Good   Clinical Impairments Affecting Rehab Potential Painful Lt knee; having TKA on Lt knee first week in September   PT Duration 6 weeks   PT Treatment/Interventions ADLs/Self Care Home Management;Cryotherapy;Electrical Stimulation;Ultrasound;Gait training;Stair training;Functional mobility training;Therapeutic activities;Therapeutic exercise;Balance training;Patient/family education;Manual techniques;Passive range of motion   PT Next Visit Plan discharge to HEP       Patient will benefit from skilled  therapeutic intervention in order to improve the following deficits and impairments:  Abnormal gait, Decreased activity tolerance, Decreased balance, Pain, Decreased strength, Difficulty walking, Increased edema, Increased fascial restricitons, Decreased range of motion  Visit Diagnosis: Acute pain of left knee  Difficulty in walking, not elsewhere classified  Unsteadiness on feet  Stiffness of left knee, not elsewhere classified     Problem List Patient Active Problem List   Diagnosis Date Noted  . S/P knee replacement 07/05/2016  . Primary osteoarthritis of left hip 08/18/2015  . Primary osteoarthritis of right knee 08/18/2015  . Primary osteoarthritis of left knee 08/18/2015  . History of osteoarthritis 10/07/2014  . Overweight(278.02) 05/16/2012    Rayetta Humphrey, PT CLT [phone] 09/15/2016, 9:00 AM  [facility] [address] [place], [place], [address] Phone: [phone]   Fax:  [phone]  Name: MALIKI GIGNAC MRN: [id number] Date  of Birth: [birth year]   PHYSICAL THERAPY DISCHARGE SUMMARY  Visits from Start of Care: 6  Current functional level related to goals / functional outcomes: See above    Remaining deficits: See above   Education / Equipment: HEP Plan: Patient agrees to discharge.  Patient goals were partially met. Patient is being discharged due to being pleased with the current functional level.  ?????        Rayetta Humphrey, [place] CLT [phone]

## 2016-09-15 NOTE — Patient Instructions (Addendum)
Functional Quadriceps: Sit to Stand    Sit on edge of chair, feet flat on floor. Stand upright, extending knees fully. Repeat _10___ times per set. Do ___1_ sets per session. Do __2__ sessions per day.  http://orth.exer.us/734   Copyright  VHI. All rights reserved.  Strengthening: Straight Leg Raise (Phase 1)    Tighten muscles on front of right thigh, then lift leg __15__ inches from surface, keeping knee locked.  Repeat __10__ times per set. Do __1__ sets per session. Do ___2_ sessions per day.  http://orth.exer.us/614   Copyright  VHI. All rights reserved.  Bridging    Slowly raise buttocks from floor, keeping stomach tight. Repeat _15___ times per set. Do _1___ sets per session. Do _2___ sessions per day.  http://orth.exer.us/1096   Copyright  VHI. All rights reserved.  Wall Slide    Keep head, shoulders, and back against wall, with feet out in front and slightly wider than shoulder width. Slowly lower buttocks by sliding down wall until thighs are parallel to floor. Keep back flat. Repeat _10___ times per set. Do ____1 sets per session. Do ___2_ sessions per day.  http://orth.exer.us/152   Copyright  VHI. All rights reserved.

## 2016-10-20 ENCOUNTER — Ambulatory Visit (INDEPENDENT_AMBULATORY_CARE_PROVIDER_SITE_OTHER): Payer: Managed Care, Other (non HMO) | Admitting: Physician Assistant

## 2016-10-20 VITALS — BP 120/80 | HR 65 | Temp 97.7°F | Resp 17 | Ht 69.0 in | Wt 211.0 lb

## 2016-10-20 DIAGNOSIS — B9689 Other specified bacterial agents as the cause of diseases classified elsewhere: Secondary | ICD-10-CM

## 2016-10-20 DIAGNOSIS — J019 Acute sinusitis, unspecified: Secondary | ICD-10-CM

## 2016-10-20 MED ORDER — AMOXICILLIN-POT CLAVULANATE 875-125 MG PO TABS: 1.0000 | ORAL_TABLET | Freq: Two times a day (BID) | ORAL | 0 refills | Status: DC

## 2016-10-20 NOTE — Progress Notes (Signed)
   10/20/2016 8:33 AM   DOB: 1951/11/07 / MRN: [id number]  SUBJECTIVE:  Stanley Kelly is a 65 y.o. male presenting for a sinus infection that is causing him right sided facial pain and right sided teeth pain. Denies fever.  Has been trying several OTC meds including Claritin consistently.  He feels that he is not getting better worse or the same.  He has no history of ABRS and states "usually I can take OTC medication and I'm okay."  He is allergic to no known allergies.   He  has a past medical history of Arthritis; GERD (gastroesophageal reflux disease); and Kidney stones.    He  reports that he has never smoked. He has never used smokeless tobacco. He reports that he does not drink alcohol or use drugs. He  reports that he currently engages in sexual activity. The patient  has a past surgical history that includes Joint replacement; Total hip arthroplasty (Left, 08/18/2015); Injection knee (Bilateral, 08/18/2015); Vasectomy (1985); Total knee arthroplasty (Right, 07/05/2016); Total knee arthroplasty (Left, 08/13/2016); Tonsillectomy; Knee arthroscopy (Bilateral); Knee surgery (~ 1982); and Total knee arthroplasty (Left, 08/13/2016).  His family history is not on file. He was adopted.  Review of Systems  Constitutional: Positive for diaphoresis (at night). Negative for chills and fever.  HENT: Negative for sore throat.   Gastrointestinal: Negative for nausea.  Skin: Negative for itching and rash.  Neurological: Negative for dizziness.    The problem list and medications were reviewed and updated by myself where necessary and exist elsewhere in the encounter.   OBJECTIVE:  BP 120/80 (BP Location: Right Arm, Patient Position: Sitting, Cuff Size: Normal)   Pulse 65   Temp 97.7 F (36.5 C) (Oral)   Resp 17   Ht 5\' 9"  (1.753 m)   Wt 211 lb (95.7 kg)   SpO2 98%   BMI 31.16 kg/m   Physical Exam  Constitutional: He is oriented to person, place, and time.  HENT:  Head:    Right Ear:  Tympanic membrane normal.  Left Ear: Tympanic membrane normal.  Nose: Nose normal.  Mouth/Throat: Uvula is midline, oropharynx is clear and moist and mucous membranes are normal.  Dentition in good repair.   Cardiovascular: Normal rate and regular rhythm.   Pulmonary/Chest: Effort normal and breath sounds normal.  Musculoskeletal: Normal range of motion.  Neurological: He is alert and oriented to person, place, and time.  Skin: Skin is warm and dry.  Vitals reviewed.   No results found for this or any previous visit (from the past 72 hour(s)).  No results found.  ASSESSMENT AND PLAN  Elford was seen today for sinusitis.  Diagnoses and all orders for this visit:  Acute bacterial rhinosinusitis Comments: Duration and symptoms are consistent.  Will treat.  Anticipatory guidance provided with regard to possible need of extended therapy.  Orders: -     amoxicillin-clavulanate (AUGMENTIN) 875-125 MG tablet; Take 1 tablet by mouth 2 (two) times daily.    The patient is advised to call or return to clinic if he does not see an improvement in symptoms, or to seek the care of the closest emergency department if he worsens with the above plan.   Philis Fendt, MHS, PA-C [facility] 10/20/2016 8:33 AM

## 2016-10-20 NOTE — Patient Instructions (Signed)
In some cases it may take up to thirty days of antibiotics to eradicate infection in the sinus.  Please call me for an extension of therapy if your symptoms reemerge after the initial 10 day course.

## 2018-07-29 DIAGNOSIS — Z23 Encounter for immunization: Secondary | ICD-10-CM | POA: Diagnosis not present

## 2018-08-08 ENCOUNTER — Ambulatory Visit (INDEPENDENT_AMBULATORY_CARE_PROVIDER_SITE_OTHER): Payer: Medicare Other | Admitting: Physician Assistant

## 2018-08-08 ENCOUNTER — Other Ambulatory Visit: Payer: Self-pay

## 2018-08-08 ENCOUNTER — Encounter: Payer: Self-pay | Admitting: Physician Assistant

## 2018-08-08 VITALS — BP 124/60 | HR 64 | Temp 97.6°F | Resp 18 | Ht 67.52 in | Wt 227.2 lb

## 2018-08-08 DIAGNOSIS — Z0000 Encounter for general adult medical examination without abnormal findings: Secondary | ICD-10-CM | POA: Diagnosis not present

## 2018-08-08 DIAGNOSIS — Z125 Encounter for screening for malignant neoplasm of prostate: Secondary | ICD-10-CM | POA: Diagnosis not present

## 2018-08-08 DIAGNOSIS — Z1211 Encounter for screening for malignant neoplasm of colon: Secondary | ICD-10-CM

## 2018-08-08 DIAGNOSIS — Z13220 Encounter for screening for lipoid disorders: Secondary | ICD-10-CM | POA: Diagnosis not present

## 2018-08-08 DIAGNOSIS — Z131 Encounter for screening for diabetes mellitus: Secondary | ICD-10-CM | POA: Diagnosis not present

## 2018-08-08 DIAGNOSIS — E119 Type 2 diabetes mellitus without complications: Secondary | ICD-10-CM | POA: Diagnosis not present

## 2018-08-08 NOTE — Patient Instructions (Addendum)
   If you have lab work done today you will be contacted with your lab results within the next 2 weeks.  If you have not heard from us then please contact us. The fastest way to get your results is to register for My Chart.   IF you received an x-ray today, you will receive an invoice from [facility]. Please contact [facility] at [phone] with questions or concerns regarding your invoice.   IF you received labwork today, you will receive an invoice from [facility]. Please contact [facility] at [phone] with questions or concerns regarding your invoice.   Our billing staff will not be able to assist you with questions regarding bills from these companies.  You will be contacted with the lab results as soon as they are available. The fastest way to get your results is to activate your My Chart account. Instructions are located on the last page of this paperwork. If you have not heard from us regarding the results in 2 weeks, please contact this office.     Advance Directive Advance directives are legal documents that let you make choices ahead of time about your health care and medical treatment in case you become unable to communicate for yourself. Advance directives are a way for you to communicate your wishes to family, friends, and health care providers. This can help convey your decisions about end-of-life care if you become unable to communicate. Discussing and writing advance directives should happen over time rather than all at once. Advance directives can be changed depending on your situation and what you want, even after you have signed the advance directives. If you do not have an advance directive, some states assign family decision makers to act on your behalf based on how closely you are related to them. Each state has its own laws regarding advance directives. You may want to check with your health care provider, attorney, or state representative about the  laws in your state. There are different types of advance directives, such as:  Medical power of attorney.  Living will.  Do not resuscitate (DNR) or do not attempt resuscitation (DNAR) order.  Health care proxy and medical power of attorney A health care proxy, also called a health care agent, is a person who is appointed to make medical decisions for you in cases in which you are unable to make the decisions yourself. Generally, people choose someone they know well and trust to represent their preferences. Make sure to ask this person for an agreement to act as your proxy. A proxy may have to exercise judgment in the event of a medical decision for which your wishes are not known. A medical power of attorney is a legal document that names your health care proxy. Depending on the laws in your state, after the document is written, it may also need to be:  Signed.  Notarized.  Dated.  Copied.  Witnessed.  Incorporated into your medical record.  You may also want to appoint someone to manage your financial affairs in a situation in which you are unable to do so. This is called a durable power of attorney for finances. It is a separate legal document from the durable power of attorney for health care. You may choose the same person or someone different from your health care proxy to act as your agent in financial matters. If you do not appoint a proxy, or if there is a concern that the proxy is not acting in your   best interests, a court-appointed guardian may be designated to act on your behalf. Living will A living will is a set of instructions documenting your wishes about medical care when you cannot express them yourself. Health care providers should keep a copy of your living will in your medical record. You may want to give a copy to family members or friends. To alert caregivers in case of an emergency, you can place a card in your wallet to let them know that you have a living will and  where they can find it. A living will is used if you become:  Terminally ill.  Incapacitated.  Unable to communicate or make decisions.  Items to consider in your living will include:  The use or non-use of life-sustaining equipment, such as dialysis machines and breathing machines (ventilators).  A DNR or DNAR order, which is the instruction not to use cardiopulmonary resuscitation (CPR) if breathing or heartbeat stops.  The use or non-use of tube feeding.  Withholding of food and fluids.  Comfort (palliative) care when the goal becomes comfort rather than a cure.  Organ and tissue donation.  A living will does not give instructions for distributing your money and property if you should pass away. It is recommended that you seek the advice of a lawyer when writing a will. Decisions about taxes, beneficiaries, and asset distribution will be legally binding. This process can relieve your family and friends of any concerns surrounding disputes or questions that may come up about the distribution of your assets. DNR or DNAR A DNR or DNAR order is a request not to have CPR in the event that your heart stops beating or you stop breathing. If a DNR or DNAR order has not been made and shared, a health care provider will try to help any patient whose heart has stopped or who has stopped breathing. If you plan to have surgery, talk with your health care provider about how your DNR or DNAR order will be followed if problems occur. Summary  Advance directives are the legal documents that allow you to make choices ahead of time about your health care and medical treatment in case you become unable to communicate for yourself.  The process of discussing and writing advance directives should happen over time. You can change the advance directives, even after you have signed them.  Advance directives include DNR or DNAR orders, living wills, and designating an agent as your medical power of  attorney. This information is not intended to replace advice given to you by your health care provider. Make sure you discuss any questions you have with your health care provider. Document Released: 02/29/2008 Document Revised: 10/11/2016 Document Reviewed: 10/11/2016 Elsevier Interactive Patient Education  2017 [employer]vier Inc.  

## 2018-08-08 NOTE — Progress Notes (Signed)
Presents today for Medicare Visit.   Interpreter used for this visit? no  Other items to address today: none  Cancer Screening:  Colon (every 10 years - ages 72-75): yes - had it done in [place] - unsure when it was - normal Prostate (annually - ages > 84): yes - desires PSA testing  Other screening: Bone density screening (every 2 years - ages >18): never [place] for AAA (males 31-75 - smoking history - once): never a smoker ETOH use: none Dental visits:  every 6 months Vision visits: wears glasses - about 6 months Typical Meals: 1 meals - with snacks - sandwiches Typical Beverages: water and diet soda (1) Exercise: work, ride stationary bike - 2-3 times a week  Lab Screening: Last screening for diabetes (annually): will do today Last lipid screening (every 5 years): will do today EKG  Activities of Daily Living In your present state of health, do you have any difficulty performing the following activities?:  Driving? no Managing money?  No Feeding yourself? No Getting from bed to chair? No Climbing a flight of stairs? no Preparing food and eating?: No Bathing or showering? No Getting dressed: No Getting to the toilet? No Using the toilet:No Moving around from place to place: No In the past year have you fallen or had a near fall?:No   Are you sexually active?  No - wife is not interested  Do you have more than one partner?  No  Hearing Difficulties: No Do you often ask people to speak up or repeat themselves? No Do you experience ringing or noises in your ears? No Do you have difficulty understanding soft or whispered voices? No   Do you feel that you have a problem with memory? Has noticed that he is forgetting things - forgets what he is going to say  Do you often misplace items? No  Do you feel safe at home?  No  Cognitive Testing  Alert? No  Normal Appearance?No  Oriented to person? No  Place? No   Time? No  ADVANCE DIRECTIVES: Discussed: yes On  File: no Materials Provided: yes Patient desires CPR (No ), mechanical ventilation (No ), prolonged artificial support (may include mechanical ventilation, tube/PEG feeding, etc) (No ).  Depression screen [facility] 2/9 10/20/2016 05/17/2016 10/07/2014  Decreased Interest 0 0 0  Down, Depressed, Hopeless 0 0 0  PHQ - 2 Score 0 0 0     Functional Status Survey: Is the patient deaf or have difficulty hearing?: No Does the patient have difficulty seeing, even when wearing glasses/contacts?: No Does the patient have difficulty concentrating, remembering, or making decisions?: Yes Does the patient have difficulty walking or climbing stairs?: No Does the patient have difficulty dressing or bathing?: No Does the patient have difficulty doing errands alone such as visiting a doctor's office or shopping?: No   Fall Risk  05/17/2016 10/07/2014  Falls in the past year? No No     Immunization status:  Immunization History  Administered Date(s) Administered  . Influenza Split 09/17/2012  . Influenza-Unspecified 09/23/2014, 07/23/2016  . Pneumococcal Conjugate-13 07/29/2018  . Tdap 05/16/2012  . Zoster 10/14/2014    There are no preventive care reminders to display for this patient.  Patient Care Team: Shawnee Knapp, MD as PCP - General (Family Medicine)   Patient Active Problem List   Diagnosis Date Noted  . S/P knee replacement 07/05/2016  . Primary osteoarthritis of left hip 08/18/2015  . Primary osteoarthritis of right knee  08/18/2015  . Primary osteoarthritis of left knee 08/18/2015  . History of osteoarthritis 10/07/2014  . Overweight(278.02) 05/16/2012     Past Medical History:  Diagnosis Date  . Arthritis    "knees" (08/13/2016)  . GERD (gastroesophageal reflux disease)   . Kidney stones    "no OR" (08/13/2016)     Past Surgical History:  Procedure Laterality Date  . INJECTION KNEE Bilateral 08/18/2015   Procedure: BILATERAL TOTAL KNEE BILATERAL CORTISONE INJECTION;  Surgeon:  Dorna Leitz, MD;  Location: [facility];  Service: Orthopedics;  Laterality: Bilateral;  . JOINT REPLACEMENT    . KNEE ARTHROSCOPY Bilateral    "2 on my right; 1 on the left"  . KNEE SURGERY  ~ 1982   "opened it up, cleaned it out after it had locked up"  . TONSILLECTOMY     "I think"  . TOTAL HIP ARTHROPLASTY Left 08/18/2015   Procedure: TOTAL HIP ARTHROPLASTY ANTERIOR APPROACH;  Surgeon: Dorna Leitz, MD;  Location: [facility];  Service: Orthopedics;  Laterality: Left;  . TOTAL KNEE ARTHROPLASTY Right 07/05/2016   Procedure: RIGHT TOTAL KNEE ARTHROPLASTY;  Surgeon: Dorna Leitz, MD;  Location: [facility];  Service: Orthopedics;  Laterality: Right;  . TOTAL KNEE ARTHROPLASTY Left 08/13/2016  . TOTAL KNEE ARTHROPLASTY Left 08/13/2016   Procedure: TOTAL KNEE ARTHROPLASTY;  Surgeon: Dorna Leitz, MD;  Location: [facility];  Service: Orthopedics;  Laterality: Left;  Marland Kitchen VASECTOMY  1985     Family History  Adopted: Yes     Social History   Socioeconomic History  . Marital status: Married    Spouse name: Not on file  . Number of children: Not on file  . Years of education: Not on file  . Highest education level: Not on file  Occupational History  . Not on file  Social Needs  . Financial resource strain: Not on file  . Food insecurity:    Worry: Not on file    Inability: Not on file  . Transportation needs:    Medical: Not on file    Non-medical: Not on file  Tobacco Use  . Smoking status: Never Smoker  . Smokeless tobacco: Never Used  Substance and Sexual Activity  . Alcohol use: No    Alcohol/week: 0.0 standard drinks  . Drug use: No  . Sexual activity: Not Currently  Lifestyle  . Physical activity:    Days per week: Not on file    Minutes per session: Not on file  . Stress: Not on file  Relationships  . Social connections:    Talks on phone: Not on file    Gets together: Not on file    Attends religious service: Not on file    Active member of club or organization: Not on file    Attends  meetings of clubs or organizations: Not on file    Relationship status: Not on file  . Intimate partner violence:    Fear of current or ex partner: Not on file    Emotionally abused: Not on file    Physically abused: Not on file    Forced sexual activity: Not on file  Other Topics Concern  . 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 No Known Allergies     Prior to  Admission medications   Medication Sig Start Date End Date Taking? Authorizing Provider  aspirin 81 MG tablet Take 81 mg by mouth daily.   Yes [provider]  esomeprazole (NEXIUM) 20 MG capsule Take 20 mg by mouth daily at 12 noon.   Yes [provider]  meloxicam (MOBIC) 15 MG tablet Take 15 mg by mouth daily.   Yes [provider]  Multiple Vitamins-Minerals (MULTIVITAMIN WITH MINERALS) tablet Take 1 tablet by mouth daily.   Yes [provider]  Omega-3 Fatty Acids (FISH OIL ADULT GUMMIES PO) Take by mouth daily.   Yes [provider]  Red Yeast Rice Extract (RED YEAST RICE PO) Take by mouth daily.   Yes [provider]      ELECTROCARDIOGRAM  Rhythm: sinus bradycarida at a rate of 57. Findings: incomplete right bundle brach block - no acute chages2 Last EKG: 05/17/2016  Changes from last EKG:no I have personally reviewed the EKG tracing and agree with the computerized printout.   Review of Systems  Constitutional: Negative.   HENT: Negative.   Eyes: Negative.   Respiratory: Negative.   Cardiovascular: Negative.   Gastrointestinal: Negative.   Endocrine: Negative.   Genitourinary: Negative.   Musculoskeletal: Negative.   Skin: Negative.   Allergic/Immunologic: Negative.   Neurological: Negative.   Hematological: Negative.   Psychiatric/Behavioral: Negative.      PHYSICAL EXAM: BP 124/60    Pulse 64   Temp 97.6 F (36.4 C) (Oral)   Resp 18   Ht 5' 7.52" (1.715 m)   Wt 227 lb 3.2 oz (103.1 kg)   SpO2 96%   BMI 35.04 kg/m   Wt Readings from Last 3 Encounters:  08/08/18 227 lb 3.2 oz (103.1 kg)  10/20/16 211 lb (95.7 kg)  08/13/16 211 lb 12 oz (96 kg)      Visual Acuity Screening   Right eye Left eye Both eyes  Without correction:     With correction: _0    Physical Exam  Vitals reviewed. Constitutional: He is oriented to person, place, and time. He appears well-developed and well-nourished.  HENT:  Head: Normocephalic and atraumatic.  Eyes: Pupils are equal, round, and reactive to light. Conjunctivae and EOM are normal.  Neck: Normal range of motion.  Cardiovascular: Normal rate, regular rhythm and normal heart sounds.  Respiratory: Effort normal and breath sounds normal.  GI: Soft. Bowel sounds are normal. There is tenderness (generalized).  Musculoskeletal: Normal range of motion.  Neurological: He is alert and oriented to person, place, and time.  Skin: Skin is warm and dry.  Few seborrhea keratosis  Psychiatric: He has a normal mood and affect. His behavior is normal. Judgment and thought content normal.     Education/Counseling: yes diet and exercise yes prevention of chronic diseases n/a smoking/tobacco cessation yes review "Covered Medicare Preventive Services"    ASSESSMENT/PLAN: Welcome to Medicare preventive visit - Plan: EKG 12-Lead  Screening for diabetes mellitus - Plan: CMP14+EGFR  Screening, lipid - Plan: Lipid panel  Screening for prostate cancer  Screen for colon cancer - Plan: Ambulatory referral to Gastroenterology  Screening PSA (prostate specific antigen) - Plan: PSA    Check labs -- anticipatory guidance given including advance directives discussion to be had with family members.  Information was given an after visit summary.  Windell Hummingbird PA-C  [facility] 08/08/2018  4:35 PM

## 2018-08-09 LAB — CMP14+EGFR
ALBUMIN: 4.4 g/dL (ref 3.6–4.8)
ALT: 29 IU/L (ref 0–44)
AST: 24 IU/L (ref 0–40)
Albumin/Globulin Ratio: 1.8 (ref 1.2–2.2)
Alkaline Phosphatase: 64 IU/L (ref 39–117)
BUN / CREAT RATIO: 17 (ref 10–24)
BUN: 16 mg/dL (ref 8–27)
Bilirubin Total: 0.5 mg/dL (ref 0.0–1.2)
CO2: 24 mmol/L (ref 20–29)
CREATININE: 0.96 mg/dL (ref 0.76–1.27)
Calcium: 9.8 mg/dL (ref 8.6–10.2)
Chloride: 105 mmol/L (ref 96–106)
GFR calc Af Amer: 95 mL/min/{1.73_m2} (ref >59)
GFR calc non Af Amer: 83 mL/min/{1.73_m2} (ref >59)
GLOBULIN, TOTAL: 2.4 g/dL (ref 1.5–4.5)
Glucose: 98 mg/dL (ref 65–99)
Potassium: 4.5 mmol/L (ref 3.5–5.2)
SODIUM: 143 mmol/L (ref 134–144)
Total Protein: 6.8 g/dL (ref 6.0–8.5)

## 2018-08-09 LAB — LIPID PANEL
CHOL/HDL RATIO: 4 ratio (ref 0.0–5.0)
Cholesterol, Total: 164 mg/dL (ref 100–199)
HDL: 41 mg/dL (ref >39)
LDL Calculated: 93 mg/dL (ref 0–99)
TRIGLYCERIDES: 151 mg/dL — AB (ref 0–149)
VLDL CHOLESTEROL CAL: 30 mg/dL (ref 5–40)

## 2018-08-09 LAB — PSA: Prostate Specific Ag, Serum: 0.5 ng/mL (ref 0.0–4.0)

## 2018-10-11 ENCOUNTER — Encounter: Payer: Self-pay | Admitting: Family Medicine

## 2018-11-20 DIAGNOSIS — M25561 Pain in right knee: Secondary | ICD-10-CM | POA: Diagnosis not present

## 2019-01-01 ENCOUNTER — Encounter: Payer: Self-pay | Admitting: Gastroenterology

## 2019-01-19 ENCOUNTER — Ambulatory Visit (AMBULATORY_SURGERY_CENTER): Payer: Self-pay

## 2019-01-19 VITALS — Ht 68.0 in | Wt 241.6 lb

## 2019-01-19 DIAGNOSIS — Z1211 Encounter for screening for malignant neoplasm of colon: Secondary | ICD-10-CM

## 2019-01-19 MED ORDER — NA SULFATE-K SULFATE-MG SULF 17.5-3.13-1.6 GM/177ML PO SOLN: 1.0000 | Freq: Once | ORAL | 0 refills | Status: AC

## 2019-01-19 NOTE — Progress Notes (Signed)
Denies allergies to eggs or soy products. Denies complication of anesthesia or sedation. Denies use of weight loss medication. Denies use of O2.   Emmi instructions declined.  

## 2019-01-29 ENCOUNTER — Encounter: Payer: Self-pay | Admitting: Gastroenterology

## 2019-01-29 ENCOUNTER — Ambulatory Visit (AMBULATORY_SURGERY_CENTER): Payer: Medicare Other | Admitting: Gastroenterology

## 2019-01-29 VITALS — BP 106/69 | HR 61 | Temp 98.0°F | Resp 10 | Ht 67.25 in | Wt 227.0 lb

## 2019-01-29 DIAGNOSIS — Z1211 Encounter for screening for malignant neoplasm of colon: Secondary | ICD-10-CM

## 2019-01-29 DIAGNOSIS — D120 Benign neoplasm of cecum: Secondary | ICD-10-CM | POA: Diagnosis not present

## 2019-01-29 MED ORDER — SODIUM CHLORIDE 0.9 % IV SOLN: 500.0000 mL | Freq: Once | INTRAVENOUS | Status: DC

## 2019-01-29 NOTE — Progress Notes (Signed)
Pt's states no medical or surgical changes since previsit or office visit. 

## 2019-01-29 NOTE — Progress Notes (Signed)
To PACU, VSS. Report to [email or web address] 

## 2019-01-29 NOTE — Patient Instructions (Signed)
YOU HAD AN ENDOSCOPIC PROCEDURE TODAY AT THE [facility]:   Refer to the procedure report that was given to you for any specific questions about what was found during the examination.  If the procedure report does not answer your questions, please call your gastroenterologist to clarify.  If you requested that your care partner not be given the details of your procedure findings, then the procedure report has been included in a sealed envelope for you to review at your convenience later.  YOU SHOULD EXPECT: Some feelings of bloating in the abdomen. Passage of more gas than usual.  Walking can help get rid of the air that was put into your GI tract during the procedure and reduce the bloating. If you had a lower endoscopy (such as a colonoscopy or flexible sigmoidoscopy) you may notice spotting of blood in your stool or on the toilet paper. If you underwent a bowel prep for your procedure, you may not have a normal bowel movement for a few days.  Please Note:  You might notice some irritation and congestion in your nose or some drainage.  This is from the oxygen used during your procedure.  There is no need for concern and it should clear up in a day or so.  SYMPTOMS TO REPORT IMMEDIATELY:   Following lower endoscopy (colonoscopy or flexible sigmoidoscopy):  Excessive amounts of blood in the stool  Significant tenderness or worsening of abdominal pains  Swelling of the abdomen that is new, acute  Fever of 100F or higher  For urgent or emergent issues, a gastroenterologist can be reached at any hour by calling [phone].   DIET:  We do recommend a small meal at first, but then you may proceed to your regular diet.  Drink plenty of fluids but you should avoid alcoholic beverages for 24 hours.  MEDICATIONS: Continue present medications.  Please see handouts given to you by your recovery nurse.  ACTIVITY:  You should plan to take it easy for the rest of today and you should NOT  DRIVE or use heavy machinery until tomorrow (because of the sedation medicines used during the test).    FOLLOW UP: Our staff will call the number listed on your records the next business day following your procedure to check on you and address any questions or concerns that you may have regarding the information given to you following your procedure. If we do not reach you, we will leave a message.  However, if you are feeling well and you are not experiencing any problems, there is no need to return our call.  We will assume that you have returned to your regular daily activities without incident.  If any biopsies were taken you will be contacted by phone or by letter within the next 1-3 weeks.  Please call us at [phone] if you have not heard about the biopsies in 3 weeks.   Thank you for allowing us to provide for your healthcare needs today.   SIGNATURES/CONFIDENTIALITY: You and/or your care partner have signed paperwork which will be entered into your electronic medical record.  These signatures attest to the fact that that the information above on your After Visit Summary has been reviewed and is understood.  Full responsibility of the confidentiality of this discharge information lies with you and/or your care-partner. 

## 2019-01-29 NOTE — Op Note (Signed)
[facility] Patient Name: Stanley Kelly Procedure Date: 01/29/2019 1:49 PM MRN: [id number] Endoscopist: Mauri Pole , MD Age: 68 Referring MD:  Date of Birth: 03-20-1951 Gender: Male Account #: [id number] Procedure:                Colonoscopy Indications:              Screening for colorectal malignant neoplasm Medicines:                Monitored Anesthesia Care Procedure:                Pre-Anesthesia Assessment:                           - Prior to the procedure, a History and Physical                            was performed, and patient medications and                            allergies were reviewed. The patient's tolerance of                            previous anesthesia was also reviewed. The risks                            and benefits of the procedure and the sedation                            options and risks were discussed with the patient.                            All questions were answered, and informed consent                            was obtained. Prior Anticoagulants: The patient has                            taken no previous anticoagulant or antiplatelet                            agents. ASA Grade Assessment: II - A patient with                            mild systemic disease. After reviewing the risks                            and benefits, the patient was deemed in                            satisfactory condition to undergo the procedure.                           After obtaining informed consent, the colonoscope  was passed under direct vision. Throughout the                            procedure, the patient's blood pressure, pulse, and                            oxygen saturations were monitored continuously. The                            Model PCF-[id number]) scope was introduced                            through the anus and advanced to the the cecum,                            identified by  appendiceal orifice and ileocecal                            valve. The colonoscopy was performed without                            difficulty. The patient tolerated the procedure                            well. The quality of the bowel preparation was                            excellent. The ileocecal valve, appendiceal                            orifice, and rectum were photographed. Scope In: 2:01:30 PM Scope Out: 2:20:18 PM Scope Withdrawal Time: 0 hours 10 minutes 4 seconds  Total Procedure Duration: 0 hours 18 minutes 48 seconds  Findings:                 The perianal and digital rectal examinations were                            normal.                           A 1 mm polyp was found in the cecum. The polyp was                            sessile. The polyp was removed with a cold biopsy                            forceps. Resection and retrieval were complete.                           A few small-mouthed diverticula were found in the                            sigmoid colon.  Non-bleeding internal hemorrhoids were found during                            retroflexion. The hemorrhoids were small. Complications:            No immediate complications. Estimated Blood Loss:     Estimated blood loss was minimal. Impression:               - One 1 mm polyp in the cecum, removed with a cold                            biopsy forceps. Resected and retrieved.                           - Diverticulosis in the sigmoid colon.                           - Non-bleeding internal hemorrhoids. Recommendation:           - Patient has a contact number available for                            emergencies. The signs and symptoms of potential                            delayed complications were discussed with the                            patient. Return to normal activities tomorrow.                            Written discharge instructions were provided to the                             patient.                           - Resume previous diet.                           - Continue present medications.                           - Await pathology results.                           - Repeat colonoscopy in 5-10 years for surveillance                            based on pathology results. Mauri Pole, MD 01/29/2019 2:33:58 PM This report has been signed electronically.

## 2019-01-30 ENCOUNTER — Telehealth: Payer: Self-pay

## 2019-01-30 NOTE — Telephone Encounter (Signed)
  Follow up Call-  Call back number 01/29/2019  Post procedure Call Back phone  # [phone]  Permission to leave phone message Yes  Some recent data might be hidden     Patient questions:  Do you have a fever, pain , or abdominal swelling? No. Pain Score  0 *  Have you tolerated food without any problems? Yes.    Have you been able to return to your normal activities? Yes.    Do you have any questions about your discharge instructions: Diet   No. Medications  No. Follow up visit  No.  Do you have questions or concerns about your Care? No.  Actions: * If pain score is 4 or above: No action needed, pain <4.   No problems noted per pt. maw

## 2019-02-02 ENCOUNTER — Encounter: Payer: Self-pay | Admitting: Gastroenterology

## 2019-08-13 DIAGNOSIS — Z23 Encounter for immunization: Secondary | ICD-10-CM | POA: Diagnosis not present

## 2019-09-15 DIAGNOSIS — Z23 Encounter for immunization: Secondary | ICD-10-CM | POA: Diagnosis not present

## 2020-06-18 ENCOUNTER — Other Ambulatory Visit: Payer: Self-pay

## 2020-06-18 ENCOUNTER — Encounter: Payer: Self-pay | Admitting: Emergency Medicine

## 2020-06-18 ENCOUNTER — Ambulatory Visit (INDEPENDENT_AMBULATORY_CARE_PROVIDER_SITE_OTHER): Payer: Medicare Other | Admitting: Emergency Medicine

## 2020-06-18 VITALS — BP 130/77 | HR 65 | Temp 98.0°F | Resp 16 | Ht 68.0 in | Wt 237.0 lb

## 2020-06-18 DIAGNOSIS — Z8739 Personal history of other diseases of the musculoskeletal system and connective tissue: Secondary | ICD-10-CM | POA: Diagnosis not present

## 2020-06-18 DIAGNOSIS — Z7689 Persons encountering health services in other specified circumstances: Secondary | ICD-10-CM

## 2020-06-18 DIAGNOSIS — R7303 Prediabetes: Secondary | ICD-10-CM

## 2020-06-18 DIAGNOSIS — R600 Localized edema: Secondary | ICD-10-CM

## 2020-06-18 DIAGNOSIS — E785 Hyperlipidemia, unspecified: Secondary | ICD-10-CM | POA: Diagnosis not present

## 2020-06-18 DIAGNOSIS — I872 Venous insufficiency (chronic) (peripheral): Secondary | ICD-10-CM | POA: Diagnosis not present

## 2020-06-18 NOTE — Progress Notes (Signed)
Stanley Kelly 69 y.o.   Chief Complaint  Patient presents with  . Leg Swelling    per pt both - bad for the last 6 months to one year  . Establish Care    HISTORY OF PRESENT ILLNESS: This is a 69 y.o. male complaining of bilateral leg swelling on and off for the past 6 months. Used to see Dr. Brigitte Pulse, first visit with me, wants to establish care. Has history of osteoarthritis status post bilateral knee replacement. Fully vaccinated against Covid. No other complaints or medical concerns today.  HPI   Prior to Admission medications   Medication Sig Start Date End Date Taking? Authorizing Provider  amoxicillin (AMOXIL) 500 MG capsule as needed. 10/02/18  Yes [provider]  aspirin 81 MG tablet Take 81 mg by mouth daily.   Yes [provider]  esomeprazole (NEXIUM) 20 MG capsule Take 20 mg by mouth daily at 12 noon.   Yes [provider]  meloxicam (MOBIC) 15 MG tablet Take 15 mg by mouth daily.   Yes [provider]  Multiple Vitamin (MULTIVITAMIN) tablet Take 1 tablet by mouth daily.   Yes [provider]  Omega-3 Fatty Acids (FISH OIL ADULT GUMMIES PO) Take by mouth daily.   Yes [provider]  OVER THE COUNTER MEDICATION Stool softener, 100 mg. I daily.   Yes [provider]  OVER THE COUNTER MEDICATION Vitamin -B 12 500 mg. One daily   Yes [provider]  OVER THE COUNTER MEDICATION Potassium 595 mg. One tablet daily.   Yes [provider]  OVER THE COUNTER MEDICATION Vitamin E 400 units one capsule daily.   Yes [provider]  OVER THE COUNTER MEDICATION Vitamin C 100 mg. One tablet daily.   Yes [provider]  OVER THE COUNTER MEDICATION Vitamin D 3 1000 mg. One capsule daily.   Yes [provider]  OVER THE COUNTER MEDICATION 500 mg Niacin. One tablet daily.   Yes [provider]  OVER THE COUNTER MEDICATION Calcium 1200 mg. One capsule daily.   Yes [provider]  Red Yeast Rice Extract (RED YEAST RICE PO) Take by mouth daily.   Yes [provider]  FLUAD 0.5 ML SUSY TO BE ADMINISTERED BY PHARMACIST FOR IMMUNIZATION Patient not taking: Reported on 06/18/2020 07/29/18   [provider]  Multiple Vitamins-Minerals (MULTIVITAMIN WITH MINERALS) tablet Take 1 tablet by mouth daily. Patient not taking: Reported on 06/18/2020    [provider]  OVER THE COUNTER MEDICATION Fiber tablet 2 tablets daily.    [provider]    Allergies  Allergen Reactions  . No Known Allergies   . Other     Patient Active Problem List   Diagnosis Date Noted  . S/P knee replacement 07/05/2016  . Primary osteoarthritis of left hip 08/18/2015  . Primary osteoarthritis of right knee 08/18/2015  . Primary osteoarthritis of left knee 08/18/2015  . History of osteoarthritis 10/07/2014  . Overweight(278.02) 05/16/2012    Past Medical History:  Diagnosis Date  . Allergy   . Arthritis    "knees" (08/13/2016)  . GERD (gastroesophageal reflux disease)   . Kidney stones    "no OR" (08/13/2016)    Past Surgical History:  Procedure Laterality Date  . INJECTION KNEE Bilateral 08/18/2015   Procedure: BILATERAL TOTAL KNEE BILATERAL CORTISONE INJECTION;  Surgeon: Dorna Leitz, MD;  Location: [facility];  Service: Orthopedics;  Laterality: Bilateral;  . JOINT REPLACEMENT    .  KNEE ARTHROSCOPY Bilateral    "2 on my right; 1 on the left"  . KNEE SURGERY  ~ 1982   "opened it up, cleaned it out after it had locked up"  . TONSILLECTOMY     "I think"  . TOTAL HIP ARTHROPLASTY Left 08/18/2015   Procedure: TOTAL HIP ARTHROPLASTY ANTERIOR APPROACH;  Surgeon: Dorna Leitz, MD;  Location: [facility];  Service: Orthopedics;  Laterality: Left;  . TOTAL KNEE ARTHROPLASTY Right 07/05/2016   Procedure: RIGHT TOTAL KNEE ARTHROPLASTY;  Surgeon: Dorna Leitz, MD;  Location: [facility];  Service: Orthopedics;  Laterality: Right;  . TOTAL KNEE ARTHROPLASTY Left  08/13/2016  . TOTAL KNEE ARTHROPLASTY Left 08/13/2016   Procedure: TOTAL KNEE ARTHROPLASTY;  Surgeon: Dorna Leitz, MD;  Location: [facility];  Service: Orthopedics;  Laterality: Left;  Marland Kitchen VASECTOMY  1985    Social History   Socioeconomic History  . Marital status: Married    Spouse name: Not on file  . Number of children: Not on file  . Years of education: Not on file  . Highest education level: Not on file  Occupational History  . Not on file  Tobacco Use  . Smoking status: Never Smoker  . Smokeless tobacco: Never Used  Vaping Use  . Vaping Use: Never used  Substance and Sexual Activity  . Alcohol use: No    Alcohol/week: 0.0 standard drinks  . Drug use: No  . Sexual activity: Not Currently  Other Topics Concern  . 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 Difficulty of Paying Living Expenses:   Food Insecurity:   . Worried About Charity fundraiser in the Last Year:   . Arboriculturist in the Last Year:   Transportation Needs:   . Film/video editor (Medical):   Marland Kitchen Lack of Transportation (Non-Medical):   Physical Activity:   . Days of Exercise per Week:   . Minutes of Exercise per Session:   Stress:   . Feeling of Stress :   Social Connections:   . Frequency of Communication with Friends and Family:   . Frequency of Social Gatherings with Friends and Family:   . Attends Religious Services:   . Active Member of Clubs or Organizations:   . Attends Archivist Meetings:   Marland Kitchen Marital Status:   Intimate Partner Violence:   . Fear of Current or Ex-Partner:   . Emotionally Abused:   Marland Kitchen Physically Abused:   . Sexually Abused:     Family History  Adopted: Yes     Review of Systems  Constitutional: Negative.  Negative for chills and fever.  HENT: Negative.  Negative for  congestion and sore throat.   Respiratory: Negative.  Negative for cough and shortness of breath.   Cardiovascular: Positive for leg swelling. Negative for chest pain and palpitations.  Gastrointestinal: Negative.  Negative for abdominal pain, diarrhea, nausea and vomiting.  Genitourinary: Negative.  Negative for dysuria and hematuria.  Musculoskeletal: Negative.  Negative for joint pain and myalgias.  Skin: Negative.  Negative for rash.  Neurological: Negative.  Negative for dizziness and headaches.  All other systems reviewed and are negative.    Today's Vitals   06/18/20 1002  BP: 130/77  Pulse: 65  Resp: 16  Temp: 98 F (36.7 C)  TempSrc: Temporal  SpO2: 96%  Weight: 237 lb (107.5 kg)  Height: 5\' 8"  (1.727 m)   Body mass index is 36.04 kg/m.   Physical Exam Vitals reviewed.  Constitutional:      Appearance: Normal appearance.  HENT:     Head: Normocephalic.     Mouth/Throat:     Mouth: Mucous membranes are moist.     Pharynx: Oropharynx is clear.  Eyes:     Extraocular Movements: Extraocular movements intact.     Conjunctiva/sclera: Conjunctivae normal.     Pupils: Pupils are equal, round, and reactive to light.  Cardiovascular:     Rate and Rhythm: Normal rate and regular rhythm.     Pulses: Normal pulses.     Heart sounds: Normal heart sounds.  Pulmonary:     Effort: Pulmonary effort is normal.     Breath sounds: Normal breath sounds.  Abdominal:     Palpations: Abdomen is soft.     Tenderness: There is no abdominal tenderness.  Musculoskeletal:     Cervical back: Normal range of motion and neck supple.     Comments: Lower extremities: Warm to touch.  No erythema or ecchymosis.  Mild edema.  Some varicosities noted.  Chronic venous stasis skin changes noted.  Good peripheral pulses with excellent capillary refill.  Sensation intact.  Skin:    General: Skin is warm and dry.     Capillary Refill: Capillary refill takes less than 2 seconds.  Neurological:      General: No focal deficit present.     Mental Status: He is alert and oriented to person, place, and time.  Psychiatric:        Mood and Affect: Mood normal.        Behavior: Behavior normal.    A total of 30 minutes was spent with the patient, greater than 50% of which was in counseling/coordination of care regarding establishing care, review of chronic medical problems, health maintenance items, diagnosis of chronic venous insufficiency and treatment, review of all medications, need for diagnostic blood work, diet and nutrition, review of most recent office visit notes, review of most recent blood work results, prognosis and need for follow-up   ASSESSMENT & PLAN: Demontray was seen today for leg swelling and establish care.  Diagnoses and all orders for this visit:  Chronic venous insufficiency -     Comprehensive metabolic panel -     Hemoglobin A1c -     Lipid panel  History of osteoarthritis  Bilateral leg edema -     Comprehensive metabolic panel -     Hemoglobin A1c -     Lipid panel  Dyslipidemia -     Comprehensive metabolic panel -     Hemoglobin A1c -     Lipid panel  Prediabetes -     Hemoglobin A1c  Encounter to establish care    Patient Instructions       If you have lab work done today you will be contacted with your lab results within the next 2 weeks.  If you have not heard from [place] then please contact us. The fastest way to get your results is to register for My Chart.   IF you received an x-ray today, you will receive an invoice from [facility]. Please contact [facility] at [phone] with questions or concerns regarding your invoice.   IF you received labwork today, you will receive an invoice from [facility].  Please contact [facility] at [phone] with questions or concerns regarding your invoice.   Our billing staff will not be able to assist you with questions regarding bills from these companies.  You will be contacted  with the lab results as soon as they are available. The fastest way to get your results is to activate your My Chart account. Instructions are located on the last page of this paperwork. If you have not heard from [place] regarding the results in 2 weeks, please contact this office.     Chronic Venous Insufficiency Chronic venous insufficiency is a condition where the leg veins cannot effectively pump blood from the legs to the heart. This happens when the vein walls are either stretched, weakened, or damaged, or when the valves inside the vein are damaged. With the right treatment, you should be able to continue with an active life. This condition is also called venous stasis. What are the causes? Common causes of this condition include:  High blood pressure inside the veins (venous hypertension).  Sitting or standing too long, causing increased blood pressure in the leg veins.  A blood clot that blocks blood flow in a vein (deep vein thrombosis, DVT).  Inflammation of a vein (phlebitis) that causes a blood clot to form.  Tumors in the pelvis that cause blood to back up. What increases the risk? The following factors may make you more likely to develop this condition:  Having a family history of this condition.  Obesity.  Pregnancy.  Living without enough regular physical activity or exercise (sedentary lifestyle).  Smoking.  Having a job that requires long periods of standing or sitting in one place.  Being a certain age. Women in their 35s and 54s and men in their 27s are more likely to develop this condition. What are the signs or symptoms? Symptoms of this condition include:  Veins that are enlarged, bulging, or twisted (varicose veins).  Skin breakdown or ulcers.  Reddened skin or dark discoloration of skin on the leg between the knee and ankle.  Brown, smooth, tight, and painful skin just above the ankle, usually on the inside of the leg  (lipodermatosclerosis).  Swelling of the legs. How is this diagnosed? This condition may be diagnosed based on:  Your medical history.  A physical exam.  Tests, such as: ? A procedure that creates an image of a blood vessel and nearby organs and provides information about blood flow through the blood vessel (duplex ultrasound). ? A procedure that tests blood flow (plethysmography). ? A procedure that looks at the veins using X-ray and dye (venogram). How is this treated? The goals of treatment are to help you return to an active life and to minimize pain or disability. Treatment depends on the severity of your condition, and it may include:  Wearing compression stockings. These can help relieve symptoms and help prevent your condition from getting worse. However, they do not cure the condition.  Sclerotherapy. This procedure involves an injection of a solution that shrinks damaged veins.  Surgery. This may involve: ? Removing a diseased vein (vein stripping). ? Cutting off blood flow through the vein (laser ablation surgery). ? Repairing or reconstructing a valve within the affected vein. Follow these instructions at home:      Wear compression stockings as told by your health care provider. These stockings help to prevent blood clots and reduce swelling in your legs.  Take over-the-counter and prescription medicines only as told by your health care provider.  Stay  active by exercising, walking, or doing different activities. Ask your health care provider what activities are safe for you and how much exercise you need.  Drink enough fluid to keep your urine pale yellow.  Do not use any products that contain nicotine or tobacco, such as cigarettes, e-cigarettes, and chewing tobacco. If you need help quitting, ask your health care provider.  Keep all follow-up visits as told by your health care provider. This is important. Contact a health care provider if you:  Have redness,  swelling, or more pain in the affected area.  See a red streak or line that goes up or down from the affected area.  Have skin breakdown or skin loss in the affected area, even if the breakdown is small.  Get an injury in the affected area. Get help right away if:  You get an injury and an open wound in the affected area.  You have: ? Severe pain that does not get better with medicine. ? Sudden numbness or weakness in the foot or ankle below the affected area. ? Trouble moving your foot or ankle. ? A fever. ? Worse or persistent symptoms. ? Chest pain. ? Shortness of breath. Summary  Chronic venous insufficiency is a condition where the leg veins cannot effectively pump blood from the legs to the heart.  Chronic venous insufficiency occurs when the vein walls become stretched, weakened, or damaged, or when valves within the vein are damaged.  Treatment depends on how severe your condition is. It often involves wearing compression stockings and may involve having a procedure.  Make sure you stay active by exercising, walking, or doing different activities. Ask your health care provider what activities are safe for you and how much exercise you need. This information is not intended to replace advice given to you by your health care provider. Make sure you discuss any questions you have with your health care provider. Document Revised: 08/15/2018 Document Reviewed: 08/15/2018 Elsevier Patient Education  2020 Elsevier Inc.      Agustina Caroli, MD [facility]

## 2020-06-18 NOTE — Patient Instructions (Addendum)
   If you have lab work done today you will be contacted with your lab results within the next 2 weeks.  If you have not heard from us then please contact us. The fastest way to get your results is to register for My Chart.   IF you received an x-ray today, you will receive an invoice from [facility]. Please contact [facility] at [phone] with questions or concerns regarding your invoice.   IF you received labwork today, you will receive an invoice from [facility]. Please contact [facility] at [phone] with questions or concerns regarding your invoice.   Our billing staff will not be able to assist you with questions regarding bills from these companies.  You will be contacted with the lab results as soon as they are available. The fastest way to get your results is to activate your My Chart account. Instructions are located on the last page of this paperwork. If you have not heard from us regarding the results in 2 weeks, please contact this office.     Chronic Venous Insufficiency Chronic venous insufficiency is a condition where the leg veins cannot effectively pump blood from the legs to the heart. This happens when the vein walls are either stretched, weakened, or damaged, or when the valves inside the vein are damaged. With the right treatment, you should be able to continue with an active life. This condition is also called venous stasis. What are the causes? Common causes of this condition include:  High blood pressure inside the veins (venous hypertension).  Sitting or standing too long, causing increased blood pressure in the leg veins.  A blood clot that blocks blood flow in a vein (deep vein thrombosis, DVT).  Inflammation of a vein (phlebitis) that causes a blood clot to form.  Tumors in the pelvis that cause blood to back up. What increases the risk? The following factors may make you more likely to develop this condition:  Having a family  history of this condition.  Obesity.  Pregnancy.  Living without enough regular physical activity or exercise (sedentary lifestyle).  Smoking.  Having a job that requires long periods of standing or sitting in one place.  Being a certain age. Women in their 40s and 50s and men in their 70s are more likely to develop this condition. What are the signs or symptoms? Symptoms of this condition include:  Veins that are enlarged, bulging, or twisted (varicose veins).  Skin breakdown or ulcers.  Reddened skin or dark discoloration of skin on the leg between the knee and ankle.  Brown, smooth, tight, and painful skin just above the ankle, usually on the inside of the leg (lipodermatosclerosis).  Swelling of the legs. How is this diagnosed? This condition may be diagnosed based on:  Your medical history.  A physical exam.  Tests, such as: ? A procedure that creates an image of a blood vessel and nearby organs and provides information about blood flow through the blood vessel (duplex ultrasound). ? A procedure that tests blood flow (plethysmography). ? A procedure that looks at the veins using X-ray and dye (venogram). How is this treated? The goals of treatment are to help you return to an active life and to minimize pain or disability. Treatment depends on the severity of your condition, and it may include:  Wearing compression stockings. These can help relieve symptoms and help prevent your condition from getting worse. However, they do not cure the condition.  Sclerotherapy. This procedure involves an   injection of a solution that shrinks damaged veins.  Surgery. This may involve: ? Removing a diseased vein (vein stripping). ? Cutting off blood flow through the vein (laser ablation surgery). ? Repairing or reconstructing a valve within the affected vein. Follow these instructions at home:      Wear compression stockings as told by your health care provider. These  stockings help to prevent blood clots and reduce swelling in your legs.  Take over-the-counter and prescription medicines only as told by your health care provider.  Stay active by exercising, walking, or doing different activities. Ask your health care provider what activities are safe for you and how much exercise you need.  Drink enough fluid to keep your urine pale yellow.  Do not use any products that contain nicotine or tobacco, such as cigarettes, e-cigarettes, and chewing tobacco. If you need help quitting, ask your health care provider.  Keep all follow-up visits as told by your health care provider. This is important. Contact a health care provider if you:  Have redness, swelling, or more pain in the affected area.  See a red streak or line that goes up or down from the affected area.  Have skin breakdown or skin loss in the affected area, even if the breakdown is small.  Get an injury in the affected area. Get help right away if:  You get an injury and an open wound in the affected area.  You have: ? Severe pain that does not get better with medicine. ? Sudden numbness or weakness in the foot or ankle below the affected area. ? Trouble moving your foot or ankle. ? A fever. ? Worse or persistent symptoms. ? Chest pain. ? Shortness of breath. Summary  Chronic venous insufficiency is a condition where the leg veins cannot effectively pump blood from the legs to the heart.  Chronic venous insufficiency occurs when the vein walls become stretched, weakened, or damaged, or when valves within the vein are damaged.  Treatment depends on how severe your condition is. It often involves wearing compression stockings and may involve having a procedure.  Make sure you stay active by exercising, walking, or doing different activities. Ask your health care provider what activities are safe for you and how much exercise you need. This information is not intended to replace advice  given to you by your health care provider. Make sure you discuss any questions you have with your health care provider. Document Revised: 08/15/2018 Document Reviewed: 08/15/2018 Elsevier Patient Education  2020 Elsevier Inc.  

## 2020-06-19 LAB — COMPREHENSIVE METABOLIC PANEL
ALT: 30 IU/L (ref 0–44)
AST: 24 IU/L (ref 0–40)
Albumin/Globulin Ratio: 1.8 (ref 1.2–2.2)
Albumin: 4.5 g/dL (ref 3.8–4.8)
Alkaline Phosphatase: 72 IU/L (ref 48–121)
BUN/Creatinine Ratio: 16 (ref 10–24)
BUN: 15 mg/dL (ref 8–27)
Bilirubin Total: 0.4 mg/dL (ref 0.0–1.2)
CO2: 24 mmol/L (ref 20–29)
Calcium: 9.8 mg/dL (ref 8.6–10.2)
Chloride: 104 mmol/L (ref 96–106)
Creatinine, Ser: 0.93 mg/dL (ref 0.76–1.27)
GFR calc Af Amer: 98 mL/min/{1.73_m2} (ref >59)
GFR calc non Af Amer: 85 mL/min/{1.73_m2} (ref >59)
Globulin, Total: 2.5 g/dL (ref 1.5–4.5)
Glucose: 106 mg/dL — ABNORMAL HIGH (ref 65–99)
Potassium: 4.3 mmol/L (ref 3.5–5.2)
Sodium: 140 mmol/L (ref 134–144)
Total Protein: 7 g/dL (ref 6.0–8.5)

## 2020-06-19 LAB — LIPID PANEL
Chol/HDL Ratio: 4.5 ratio (ref 0.0–5.0)
Cholesterol, Total: 171 mg/dL (ref 100–199)
HDL: 38 mg/dL — ABNORMAL LOW (ref >39)
LDL Chol Calc (NIH): 117 mg/dL — ABNORMAL HIGH (ref 0–99)
Triglycerides: 85 mg/dL (ref 0–149)
VLDL Cholesterol Cal: 16 mg/dL (ref 5–40)

## 2020-06-19 LAB — HEMOGLOBIN A1C
Est. average glucose Bld gHb Est-mCnc: 123 mg/dL
Hgb A1c MFr Bld: 5.9 % — ABNORMAL HIGH (ref 4.8–5.6)

## 2020-06-23 ENCOUNTER — Other Ambulatory Visit: Payer: Self-pay | Admitting: Emergency Medicine

## 2020-06-23 MED ORDER — MELOXICAM 15 MG PO TABS: 15.0000 mg | ORAL_TABLET | Freq: Every day | ORAL | 0 refills | Status: DC

## 2020-06-23 NOTE — Telephone Encounter (Signed)
Patient established care with Dr. Mitchel Honour last week. Patient forgot to ask about Dr. Mitchel Honour refilling the meloxicam (MOBIC) 15 MG tablet [[id number]] . Patient is requesting the refill to be sent to [phone]

## 2020-07-18 ENCOUNTER — Other Ambulatory Visit: Payer: Self-pay | Admitting: Emergency Medicine

## 2020-07-18 NOTE — Telephone Encounter (Signed)
Requested  medications are  due for refill today yes  Requested medications are on the active medication list yes  Last refill 06/23/20  Last visit 06/2020  Future visit scheduled 12/2020  Notes to clinic Unclear if this was to be continued and what diagnosis it is used for.

## 2020-08-10 ENCOUNTER — Other Ambulatory Visit: Payer: Self-pay | Admitting: Family Medicine

## 2020-08-10 NOTE — Telephone Encounter (Signed)
Requested Prescriptions  Pending Prescriptions Disp Refills   meloxicam (MOBIC) 15 MG tablet [Pharmacy Med Name: MELOXICAM 15 MG TABLET] 30 tablet 0    Sig: TAKE 1 TABLET BY MOUTH EVERY DAY     Analgesics:  COX2 Inhibitors Failed - 08/10/2020  9:12 AM      Failed - HGB in normal range and within 360 days    Hemoglobin  Date Value Ref Range Status  08/14/2016 11.5 (L) 13.0 - 17.0 g/dL Final         Passed - Cr in normal range and within 360 days    Creat  Date Value Ref Range Status  05/17/2016 0.88 0.70 - 1.25 mg/dL Final    Comment:      For patients > or = 67 years of age: The upper reference limit for Creatinine is approximately 13% higher for people identified as African-American.      Creatinine, Ser  Date Value Ref Range Status  06/18/2020 0.93 0.76 - 1.27 mg/dL Final         Passed - Patient is not pregnant      Passed - Valid encounter within last 12 months    Recent Outpatient Visits          1 month ago Chronic venous insufficiency   Primary Care at [facility], Ines Bloomer, MD   2 years ago Welcome to [facility] preventive visit   Primary Care at [place], PA-C   3 years ago Acute bacterial rhinosinusitis   Primary Care at [facility], Audrie Lia, PA-C   4 years ago Annual physical exam   [facility] at [facility], Damaris Hippo, PA-C   5 years ago History of osteoarthritis   Primary Care at [facility], [place], [place]      Future Appointments            In 4 months [place], Ines Bloomer, MD Primary Care at [place], [facility]

## 2020-08-23 DIAGNOSIS — Z23 Encounter for immunization: Secondary | ICD-10-CM | POA: Diagnosis not present

## 2020-08-30 DIAGNOSIS — M1611 Unilateral primary osteoarthritis, right hip: Secondary | ICD-10-CM | POA: Diagnosis not present

## 2020-08-30 DIAGNOSIS — M25551 Pain in right hip: Secondary | ICD-10-CM | POA: Diagnosis not present

## 2020-09-02 ENCOUNTER — Telehealth: Payer: Self-pay | Admitting: Emergency Medicine

## 2020-09-02 NOTE — Telephone Encounter (Signed)
Received a fax regarding surgery cleanse from [place]. Paperwork is in providers box beside nurses station. Appt is schedule for 09/04/20 Please advise.

## 2020-09-02 NOTE — Telephone Encounter (Signed)
Surgical clearance papers were placed on Stanley Kelly's desk

## 2020-09-03 ENCOUNTER — Ambulatory Visit: Payer: Medicare Other | Admitting: Emergency Medicine

## 2020-09-04 ENCOUNTER — Encounter: Payer: Self-pay | Admitting: Emergency Medicine

## 2020-09-04 ENCOUNTER — Other Ambulatory Visit: Payer: Self-pay | Admitting: Emergency Medicine

## 2020-09-04 ENCOUNTER — Other Ambulatory Visit: Payer: Self-pay

## 2020-09-04 ENCOUNTER — Ambulatory Visit (INDEPENDENT_AMBULATORY_CARE_PROVIDER_SITE_OTHER): Payer: Medicare Other | Admitting: Emergency Medicine

## 2020-09-04 VITALS — BP 142/81 | HR 64 | Temp 98.5°F | Ht 68.0 in | Wt 240.0 lb

## 2020-09-04 DIAGNOSIS — M1909 Primary osteoarthritis, other specified site: Secondary | ICD-10-CM | POA: Diagnosis not present

## 2020-09-04 DIAGNOSIS — R7303 Prediabetes: Secondary | ICD-10-CM

## 2020-09-04 DIAGNOSIS — Z01818 Encounter for other preprocedural examination: Secondary | ICD-10-CM | POA: Diagnosis not present

## 2020-09-04 NOTE — Progress Notes (Signed)
Subjective:    Stanley Kelly is a 69 y.o. male who presents to the office today for a preoperative consultation at the request of surgeon Lowella Petties, MD who plans on performing right total hip arthroplasty on October 21. This consultation is requested for the specific conditions prompting preoperative evaluation (i.e. because of potential affect on operative risk): None. Planned anesthesia: general. The patient has the following known anesthesia issues: None. Patients bleeding risk: no recent abnormal bleeding. Patient does not have objections to receiving blood products if needed.  The following portions of the patient's history were reviewed and updated as appropriate: allergies, current medications, past family history, past medical history, past social history, past surgical history and problem list.  Review of Systems Pertinent items noted in HPI and remainder of comprehensive ROS otherwise negative.    Objective:    BP (!) 142/81   Pulse 64   Temp 98.5 F (36.9 C)   Ht 5\' 8"  (1.727 m)   Wt 240 lb (108.9 kg)   SpO2 97%   BMI 36.49 kg/m   General Appearance:    Alert, cooperative, no distress, appears stated age  Head:    Normocephalic, without obvious abnormality, atraumatic  Eyes:    PERRL, conjunctiva/corneas clear, EOM's intact   Ears:    Normal TM's and external ear canals, both ears  Nose:   Nares normal, septum midline, mucosa normal, no drainage    or sinus tenderness  Throat:   Lips, mucosa, and tongue normal; teeth and gums normal  Neck:   Supple, symmetrical, trachea midline, no adenopathy;       thyroid:  No enlargement/tenderness/nodules; no carotid   bruit or JVD  Back:     Symmetric, no curvature, ROM normal, no CVA tenderness  Lungs:     Clear to auscultation bilaterally, respirations unlabored  Chest wall:    No tenderness or deformity  Heart:    Regular rate and Stanley, S1 and S2 normal, no murmur, rub   or gallop  Abdomen:     Soft, non-tender, bowel  sounds active all four quadrants,    no masses, no organomegaly  Genitalia:   Deferred  Rectal:   Deferred  Extremities:   Extremities normal, atraumatic, no cyanosis or edema  Pulses:   2+ and symmetric all extremities  Skin:   Skin color, texture, turgor normal, no rashes or lesions  Lymph nodes:   Cervical, supraclavicular, and axillary nodes normal  Neurologic:   CNII-XII intact. Normal strength, sensation and reflexes      throughout    Predictors of intubation difficulty:  Morbid obesity? no  Anatomically abnormal facies? no  Prominent incisors? no  Receding mandible? no  Short, thick neck? no  Neck range of motion: normal    Dentition: No chipped, loose, or missing teeth.  Cardiographics ECG: normal sinus Stanley, no blocks or conduction defects, no ischemic changes Echocardiogram: not done  Imaging Chest x-ray: Not done   Lab Review  No visits with results within 2 Month(s) from this visit.  Latest known visit with results is:  Office Visit on 06/18/2020  Component Date Value  . Glucose 06/18/2020 106*  . BUN 06/18/2020 15   . Creatinine, Ser 06/18/2020 0.93   . GFR calc non Af Amer 06/18/2020 85   . GFR calc Af Amer 06/18/2020 98   . BUN/Creatinine Ratio 06/18/2020 16   . Sodium 06/18/2020 140   . Potassium 06/18/2020 4.3   . Chloride 06/18/2020 104   .  CO2 06/18/2020 24   . Calcium 06/18/2020 9.8   . Total Protein 06/18/2020 7.0   . Albumin 06/18/2020 4.5   . Globulin, Total 06/18/2020 2.5   . Albumin/Globulin Ratio 06/18/2020 1.8   . Bilirubin Total 06/18/2020 0.4   . Alkaline Phosphatase 06/18/2020 72   . AST 06/18/2020 24   . ALT 06/18/2020 30   . Hgb A1c MFr Bld 06/18/2020 5.9*  . Est. average glucose Bld* 06/18/2020 123   . Cholesterol, Total 06/18/2020 171   . Triglycerides 06/18/2020 85   . HDL 06/18/2020 38*  . VLDL Cholesterol Cal 06/18/2020 16   . LDL Chol Calc (NIH) 06/18/2020 117*  . Chol/HDL Ratio 06/18/2020 4.5       Assessment:     Stanley Kelly was seen today for pre-op r hip surgery on 09/24/20.  Diagnoses and all orders for this visit:  Preop examination -     Hemoglobin A1c -     EKG 12-Lead -     CBC with Differential/Platelet -     Protime-INR -     Comprehensive metabolic panel  Prediabetes -     Hemoglobin A1c  Primary osteoarthritis, other specified site  -     Protime-INR    69 y.o. male with planned surgery as above.   Known risk factors for perioperative complications: None   Difficulty with intubation is not anticipated.  Cardiac Risk Estimation: Low  Current medications which may produce withdrawal symptoms if withheld perioperatively:  none   Plan:    1. Preoperative workup as follows ECG, hemoglobin, hematocrit, electrolytes, creatinine, glucose, liver function studies, coagulation studies. 2. Change in medication regimen before surgery: none, continue medication regimen including morning of surgery, with sip of water. 3. Prophylaxis for cardiac events with perioperative beta-blockers: not indicated. 4. Invasive hemodynamic monitoring perioperatively: at the discretion of anesthesiologist. 5. Deep vein thrombosis prophylaxis postoperatively:regimen to be chosen by surgical team. 6. Surveillance for postoperative MI with ECG immediately postoperatively and on postoperative days 1 and 2 AND troponin levels 24 hours postoperatively and on day 4 or hospital discharge (whichever comes first): at the discretion of anesthesiologist. 7. Other measures: None

## 2020-09-04 NOTE — Patient Instructions (Addendum)
   If you have lab work done today you will be contacted with your lab results within the next 2 weeks.  If you have not heard from us then please contact us. The fastest way to get your results is to register for My Chart.   IF you received an x-ray today, you will receive an invoice from [facility]. Please contact [facility] at [phone] with questions or concerns regarding your invoice.   IF you received labwork today, you will receive an invoice from [facility]. Please contact [facility] at [phone] with questions or concerns regarding your invoice.   Our billing staff will not be able to assist you with questions regarding bills from these companies.  You will be contacted with the lab results as soon as they are available. The fastest way to get your results is to activate your My Chart account. Instructions are located on the last page of this paperwork. If you have not heard from us regarding the results in 2 weeks, please contact this office.     Health Maintenance After Age 65 After age 65, you are at a higher risk for certain long-term diseases and infections as well as injuries from falls. Falls are a major cause of broken bones and head injuries in people who are older than age 65. Getting regular preventive care can help to keep you healthy and well. Preventive care includes getting regular testing and making lifestyle changes as recommended by your health care provider. Talk with your health care provider about:  Which screenings and tests you should have. A screening is a test that checks for a disease when you have no symptoms.  A diet and exercise plan that is right for you. What should I know about screenings and tests to prevent falls? Screening and testing are the best ways to find a health problem early. Early diagnosis and treatment give you the best chance of managing medical conditions that are common after age 65. Certain conditions and  lifestyle choices may make you more likely to have a fall. Your health care provider may recommend:  Regular vision checks. Poor vision and conditions such as cataracts can make you more likely to have a fall. If you wear glasses, make sure to get your prescription updated if your vision changes.  Medicine review. Work with your health care provider to regularly review all of the medicines you are taking, including over-the-counter medicines. Ask your health care provider about any side effects that may make you more likely to have a fall. Tell your health care provider if any medicines that you take make you feel dizzy or sleepy.  Osteoporosis screening. Osteoporosis is a condition that causes the bones to get weaker. This can make the bones weak and cause them to break more easily.  Blood pressure screening. Blood pressure changes and medicines to control blood pressure can make you feel dizzy.  Strength and balance checks. Your health care provider may recommend certain tests to check your strength and balance while standing, walking, or changing positions.  Foot health exam. Foot pain and numbness, as well as not wearing proper footwear, can make you more likely to have a fall.  Depression screening. You may be more likely to have a fall if you have a fear of falling, feel emotionally low, or feel unable to do activities that you used to do.  Alcohol use screening. Using too much alcohol can affect your balance and may make you more likely to   have a fall. What actions can I take to lower my risk of falls? General instructions  Talk with your health care provider about your risks for falling. Tell your health care provider if: ? You fall. Be sure to tell your health care provider about all falls, even ones that seem minor. ? You feel dizzy, sleepy, or off-balance.  Take over-the-counter and prescription medicines only as told by your health care provider. These include any  supplements.  Eat a healthy diet and maintain a healthy weight. A healthy diet includes low-fat dairy products, low-fat (lean) meats, and fiber from whole grains, beans, and lots of fruits and vegetables. Home safety  Remove any tripping hazards, such as rugs, cords, and clutter.  Install safety equipment such as grab bars in bathrooms and safety rails on stairs.  Keep rooms and walkways well-lit. Activity   Follow a regular exercise program to stay fit. This will help you maintain your balance. Ask your health care provider what types of exercise are appropriate for you.  If you need a cane or walker, use it as recommended by your health care provider.  Wear supportive shoes that have nonskid soles. Lifestyle  Do not drink alcohol if your health care provider tells you not to drink.  If you drink alcohol, limit how much you have: ? 0-1 drink a day for women. ? 0-2 drinks a day for men.  Be aware of how much alcohol is in your drink. In the [place] one drink equals one typical bottle of beer (12 oz), one-half glass of wine (5 oz), or one shot of hard liquor (1 oz).  Do not use any products that contain nicotine or tobacco, such as cigarettes and e-cigarettes. If you need help quitting, ask your health care provider. Summary  Having a healthy lifestyle and getting preventive care can help to protect your health and wellness after age 65.  Screening and testing are the best way to find a health problem early and help you avoid having a fall. Early diagnosis and treatment give you the best chance for managing medical conditions that are more common for people who are older than age 65.  Falls are a major cause of broken bones and head injuries in people who are older than age 65. Take precautions to prevent a fall at home.  Work with your health care provider to learn what changes you can make to improve your health and wellness and to prevent falls. This information is not intended  to replace advice given to you by your health care provider. Make sure you discuss any questions you have with your health care provider. Document Revised: 03/15/2019 Document Reviewed: 10/05/2017 Elsevier Patient Education  2020 Elsevier Inc.  

## 2020-09-04 NOTE — Telephone Encounter (Signed)
Requested Prescriptions  Pending Prescriptions Disp Refills  . meloxicam (MOBIC) 15 MG tablet [Pharmacy Med Name: MELOXICAM 15 MG TABLET] 30 tablet 0    Sig: TAKE 1 TABLET BY MOUTH EVERY DAY     Analgesics:  COX2 Inhibitors Failed - 09/04/2020  1:25 AM      Failed - HGB in normal range and within 360 days    Hemoglobin  Date Value Ref Range Status  08/14/2016 11.5 (L) 13.0 - 17.0 g/dL Final         Passed - Cr in normal range and within 360 days    Creat  Date Value Ref Range Status  05/17/2016 0.88 0.70 - 1.25 mg/dL Final    Comment:      For patients > or = 67 years of age: The upper reference limit for Creatinine is approximately 13% higher for people identified as African-American.      Creatinine, Ser  Date Value Ref Range Status  06/18/2020 0.93 0.76 - 1.27 mg/dL Final         Passed - Patient is not pregnant      Passed - Valid encounter within last 12 months    Recent Outpatient Visits          2 months ago Chronic venous insufficiency   Primary Care at [facility], Ines Bloomer, MD   2 years ago [facility] preventive visit   Primary Care at [place], PA-C   3 years ago Acute bacterial rhinosinusitis   [facility] at [facility], Audrie Lia, PA-C   4 years ago Annual physical exam   [facility] at [facility], Damaris Hippo, PA-C   5 years ago History of osteoarthritis   Primary Care at [facility], [place], [place]      Future Appointments            Today [place], Ines Bloomer, MD Primary Care at [place], [facility]   In 3 months [place], Ines Bloomer, MD Primary Care at [place], [facility]

## 2020-09-05 HISTORY — PX: TOTAL HIP ARTHROPLASTY: SHX124

## 2020-09-05 LAB — CBC WITH DIFFERENTIAL/PLATELET
Basophils Absolute: 0.1 10*3/uL (ref 0.0–0.2)
Basos: 2 %
EOS (ABSOLUTE): 0.1 10*3/uL (ref 0.0–0.4)
Eos: 4 %
Hematocrit: 42.4 % (ref 37.5–51.0)
Hemoglobin: 14.7 g/dL (ref 13.0–17.7)
Immature Grans (Abs): 0 10*3/uL (ref 0.0–0.1)
Immature Granulocytes: 0 %
Lymphocytes Absolute: 1 10*3/uL (ref 0.7–3.1)
Lymphs: 31 %
MCH: 31.2 pg (ref 26.6–33.0)
MCHC: 34.7 g/dL (ref 31.5–35.7)
MCV: 90 fL (ref 79–97)
Monocytes Absolute: 0.3 10*3/uL (ref 0.1–0.9)
Monocytes: 9 %
Neutrophils Absolute: 1.8 10*3/uL (ref 1.4–7.0)
Neutrophils: 54 %
Platelets: 286 10*3/uL (ref 150–450)
RBC: 4.71 x10E6/uL (ref 4.14–5.80)
RDW: 12.1 % (ref 11.6–15.4)
WBC: 3.3 10*3/uL — ABNORMAL LOW (ref 3.4–10.8)

## 2020-09-05 LAB — COMPREHENSIVE METABOLIC PANEL
ALT: 44 IU/L (ref 0–44)
AST: 29 IU/L (ref 0–40)
Albumin/Globulin Ratio: 2.2 (ref 1.2–2.2)
Albumin: 4.6 g/dL (ref 3.8–4.8)
Alkaline Phosphatase: 59 IU/L (ref 44–121)
BUN/Creatinine Ratio: 17 (ref 10–24)
BUN: 18 mg/dL (ref 8–27)
Bilirubin Total: 0.3 mg/dL (ref 0.0–1.2)
CO2: 24 mmol/L (ref 20–29)
Calcium: 9.8 mg/dL (ref 8.6–10.2)
Chloride: 103 mmol/L (ref 96–106)
Creatinine, Ser: 1.04 mg/dL (ref 0.76–1.27)
GFR calc Af Amer: 85 mL/min/{1.73_m2} (ref >59)
GFR calc non Af Amer: 74 mL/min/{1.73_m2} (ref >59)
Globulin, Total: 2.1 g/dL (ref 1.5–4.5)
Glucose: 102 mg/dL — ABNORMAL HIGH (ref 65–99)
Potassium: 4.7 mmol/L (ref 3.5–5.2)
Sodium: 139 mmol/L (ref 134–144)
Total Protein: 6.7 g/dL (ref 6.0–8.5)

## 2020-09-05 LAB — PROTIME-INR
INR: 1 (ref 0.9–1.2)
Prothrombin Time: 10 s (ref 9.1–12.0)

## 2020-09-05 LAB — HEMOGLOBIN A1C
Est. average glucose Bld gHb Est-mCnc: 128 mg/dL
Hgb A1c MFr Bld: 6.1 % — ABNORMAL HIGH (ref 4.8–5.6)

## 2020-09-08 ENCOUNTER — Telehealth: Payer: Self-pay | Admitting: *Deleted

## 2020-09-08 NOTE — Telephone Encounter (Signed)
Do you have this paperwork if so just let me know and I will fax it over for you.

## 2020-09-08 NOTE — Telephone Encounter (Signed)
[facility] is needing this completed paperwork faxed asap to [phone]

## 2020-09-08 NOTE — Telephone Encounter (Signed)
Faxed surgical clearance request with office visit, labs to [facility] Attn: Bethena Roys Daniels-Surgery Coordinator. Confirmation page at 1:27 pm.

## 2020-09-08 NOTE — Telephone Encounter (Signed)
Surgical clearance with requested labs and office notes faxed to [facility]: Bethena Roys Daniels-Surgical Coordinator.

## 2020-09-18 DIAGNOSIS — M1611 Unilateral primary osteoarthritis, right hip: Secondary | ICD-10-CM | POA: Diagnosis not present

## 2020-09-18 DIAGNOSIS — M25551 Pain in right hip: Secondary | ICD-10-CM | POA: Diagnosis not present

## 2020-09-24 DIAGNOSIS — M1611 Unilateral primary osteoarthritis, right hip: Secondary | ICD-10-CM | POA: Diagnosis not present

## 2020-09-26 ENCOUNTER — Encounter (HOSPITAL_COMMUNITY): Payer: Self-pay | Admitting: Physical Therapy

## 2020-09-26 ENCOUNTER — Ambulatory Visit (HOSPITAL_COMMUNITY): Payer: Medicare Other | Attending: Orthopedic Surgery | Admitting: Physical Therapy

## 2020-09-26 ENCOUNTER — Other Ambulatory Visit: Payer: Self-pay

## 2020-09-26 DIAGNOSIS — M25651 Stiffness of right hip, not elsewhere classified: Secondary | ICD-10-CM | POA: Diagnosis not present

## 2020-09-26 DIAGNOSIS — M6281 Muscle weakness (generalized): Secondary | ICD-10-CM | POA: Diagnosis not present

## 2020-09-26 DIAGNOSIS — M25551 Pain in right hip: Secondary | ICD-10-CM | POA: Diagnosis not present

## 2020-09-26 DIAGNOSIS — R262 Difficulty in walking, not elsewhere classified: Secondary | ICD-10-CM | POA: Diagnosis not present

## 2020-09-26 NOTE — Therapy (Addendum)
[facility] [place], [place], [address] Phone: [phone]   Fax:  [phone]  Physical Therapy Evaluation  Patient Details  Name: Stanley Kelly MRN: [id number] Date of Birth: 04/30/1951 Referring Provider (PT): Dorna Leitz    Encounter Date: 09/26/2020   PT End of Session - 09/26/20 1112    Visit Number 1    Number of Visits 12    Date for PT Re-Evaluation 11/07/20    Authorization Type medicare and [employer]    Progress Note Due on Visit 10    PT Start Time 0915    PT Stop Time 1000    PT Time Calculation (min) 45 min    Activity Tolerance Patient tolerated treatment well    Behavior During Therapy [facility] for tasks assessed/performed           Past Medical History:  Diagnosis Date  . Allergy   . Arthritis    "knees" (08/13/2016)  . GERD (gastroesophageal reflux disease)   . Kidney stones    "no OR" (08/13/2016)    Past Surgical History:  Procedure Laterality Date  . INJECTION KNEE Bilateral 08/18/2015   Procedure: BILATERAL TOTAL KNEE BILATERAL CORTISONE INJECTION;  Surgeon: Dorna Leitz, MD;  Location: [facility];  Service: Orthopedics;  Laterality: Bilateral;  . JOINT REPLACEMENT    . KNEE ARTHROSCOPY Bilateral    "2 on my right; 1 on the left"  . KNEE SURGERY  ~ 1982   "opened it up, cleaned it out after it had locked up"  . TONSILLECTOMY     "I think"  . TOTAL HIP ARTHROPLASTY Left 08/18/2015   Procedure: TOTAL HIP ARTHROPLASTY ANTERIOR APPROACH;  Surgeon: Dorna Leitz, MD;  Location: [facility];  Service: Orthopedics;  Laterality: Left;  . TOTAL KNEE ARTHROPLASTY Right 07/05/2016   Procedure: RIGHT TOTAL KNEE ARTHROPLASTY;  Surgeon: Dorna Leitz, MD;  Location: [facility];  Service: Orthopedics;  Laterality: Right;  . TOTAL KNEE ARTHROPLASTY Left 08/13/2016  . TOTAL KNEE ARTHROPLASTY Left 08/13/2016   Procedure: TOTAL KNEE ARTHROPLASTY;  Surgeon: Dorna Leitz, MD;  Location: [facility];  Service: Orthopedics;  Laterality: Left;    Mar[facility] Kitchen VASECTOMY  1985    There were no vitals filed for this visit.    Subjective Assessment - 09/26/20 0919    Subjective Mr. Fairley states that his Rt hip was bothering him for a year before he opted to have a total hip.  He has the operation on 09/24/20.  He is currently using a walker he was not using an assistive device prior to the operation    Pertinent History B TKR; lt THR    How long can you sit comfortably? 20-30 minutes    How long can you stand comfortably? 30 minutes    How long can you walk comfortably? 10 minutes with rolling walker    Patient Stated Goals walk without an assistive device, less pain, no limp while he is walking    Currently in Pain? No/denies   has taken two tylenol ; worst pain 8/10   Pain Location Hip    Pain Orientation Left    Pain Descriptors / Indicators Sore    Pain Type Surgical pain    Pain Onset In the past 7 days    Pain Frequency Intermittent    Aggravating Factors  being still    Pain Relieving Factors walking    Effect of Pain on Daily Activities limits activity    Multiple Pain  Sites No              [facility] PT Assessment - 09/26/20 0001      Assessment   Medical Diagnosis Rt THR     Referring Provider (PT) Dorna Leitz     Onset Date/Surgical Date 09/24/20    Next MD Visit 10/07/2020    Prior Therapy not for this       Precautions   Precautions None      Restrictions   Weight Bearing Restrictions No      Balance Screen   Has the patient fallen in the past 6 months No    Has the patient had a decrease in activity level because of a fear of falling?  No    Is the patient reluctant to leave their home because of a fear of falling?  No      Home Ecologist residence    Home Access Stairs to enter    Entrance Stairs-Number of Steps [place] One level      Prior Function   Level of Independence Independent    Vocation Retired    Leisure yard work, Training and development officer , Social research officer, government Status Within [employer] for tasks assessed      Observation/Other Assessments   Focus on Therapeutic Outcomes (FOTO)  28; 82% affected       Functional Tests   Functional tests Single leg stance;Sit to Stand      Single Leg Stance   Comments Lt 28"; RT: 0      Sit to Stand   Comments  B UE assist 4 x in 30 seconds       ROM / Strength   AROM / PROM / Strength AROM;Strength      AROM   AROM Assessment Site Hip    Right/Left Hip Right    Right Hip Flexion 50      Strength   Strength Assessment Site Hip;Knee;Ankle    Right/Left Hip Left    Right/Left Knee Right;Left    Right Knee Extension 5/5    Right/Left Ankle Right;Left    Right Ankle Dorsiflexion 4+/5    Left Ankle Dorsiflexion 5/5      Bed Mobility   Bed Mobility Sit to Supine    Sit to Supine Minimal Assistance - Patient > 75%   needs assist getting leg onto bed      Ambulation/Gait   Ambulation Distance (Feet) 308 Feet    Assistive device Rolling walker    Gait Comments 3 minute walk test                       Objective measurements completed on examination: See above findings.       [facility] Adult PT Treatment/Exercise - 09/26/20 0001      Exercises   Exercises Knee/Hip      Knee/Hip Exercises: Standing   Heel Raises Both;10 reps    Functional Squat 10 reps      Knee/Hip Exercises: Seated   Sit to Sand 5 reps      Knee/Hip Exercises: Supine   Heel Slides 5 reps    Other Supine Knee/Hip Exercises glut and abset x 10                   PT Education - 09/26/20 1111    Education Details HEP  Person(s) Educated Patient    Methods Explanation    Comprehension Verbalized understanding            PT Short Term Goals - 09/26/20 1119      PT SHORT TERM GOAL #1   Title Pt to be completeing his HEP to progress to walking in his home with a cane    Time 3    Period Weeks    Status New    Target Date 10/17/20      PT SHORT TERM GOAL #2   Title Pt  Rt hip flexion to be to 60 degrees to be able to sit on couches without difficulty    Time 3    Period Weeks    Status New      PT SHORT TERM GOAL #3   Title Pt hip pain to be no greater than a 3/10 to be able to be standing/walking for over an hour to be able to complete shopping without increased pain.    Time 3    Period Weeks    Status New             PT Long Term Goals - 09/26/20 1127      PT LONG TERM GOAL #1   Title Pt to be walking without a cane with normalized gait    Time 6    Period Weeks    Status New    Target Date 11/07/20      PT LONG TERM GOAL #2   Title PT Lt hip ROM to be to 110 to be able to squat down and pick items off the ground.    Time 6    Period Weeks    Status New      PT LONG TERM GOAL #3   Title Pt SLS on both legs to be up to 30 seconds to allow pt to feel confident walking on uneven terrain without an assistive device.     Time 6    Period Weeks    Status New      PT LONG TERM GOAL #4   Title PT Lt hip strength to be at least a 4+/5 to be able to rise from a squatted position without UE assist  to allow pt to pick items off of the floor.    Time 6    Period Weeks    Status New                  Plan - 09/26/20 1113    Clinical Impression Statement Mr. Eskelson is a 69 yo male who has had a recent anterior Right total hip on 09/24/2020.  He has been referred to skilled outpatient physical therapy.  Evaluation demonsrtates decreased ROM, decreased functional strength, decreased balance, gait abnormalities and increased pain.   Mr. Bubolz will benefit from skilled PT to address these areas and improve his functional ability.    Personal Factors and Comorbidities Comorbidity 2;Fitness    Comorbidities Lt THR; B TKR    Examination-Activity Limitations Bathing;Bed Mobility;Caring for Others;Carry;Dressing;Lift;Locomotion Level;Reach Overhead;Sit;Stairs;Stand;Toileting    Examination-Participation Restrictions Cleaning;Community  Activity;Driving;Meal Prep;Shop;Yard Work    Merchant navy officer Evolving/Moderate complexity    Clinical Decision Making Moderate    Rehab Potential Good    PT Frequency 2x / week    PT Duration 6 weeks    PT Treatment/Interventions DME Instruction;Gait training;Stair training;Functional mobility training;Therapeutic activities;Therapeutic exercise;Balance training;Patient/family education;Manual techniques    PT Next Visit Plan Attempt gt training  with cane, begin rocker board, step ups and tandem stance for improved balance,  Progess ROM, strength and balance as tolerated    PT Home Exercise Plan sit to stand, heel raise, minisquat, heelslide, glut set           Patient will benefit from skilled therapeutic intervention in order to improve the following deficits and impairments:  Decreased activity tolerance, Decreased balance, Decreased mobility, Decreased range of motion, Difficulty walking, Decreased strength, Pain, Increased edema  Visit Diagnosis: Pain in right hip - Plan: PT plan of care cert/re-cert  Stiffness of right hip, not elsewhere classified - Plan: PT plan of care cert/re-cert  Difficulty in walking, not elsewhere classified - Plan: PT plan of care cert/re-cert  Muscle weakness (generalized) - Plan: PT plan of care cert/re-cert     Problem List Patient Active Problem List   Diagnosis Date Noted  . S/P knee replacement 07/05/2016  . Primary osteoarthritis of left hip 08/18/2015  . Primary osteoarthritis of right knee 08/18/2015  . Primary osteoarthritis of left knee 08/18/2015  . History of osteoarthritis 10/07/2014  . Overweight(278.02) 05/16/2012    Rayetta Humphrey, PT CLT [phone] 09/26/2020, 11:32 AM  [facility] [address] [place], [place], [address] Phone: [phone]   Fax:  [phone]  Name: Stanley Kelly MRN: [id number] Date of Birth: July 01, 1951

## 2020-09-29 ENCOUNTER — Other Ambulatory Visit: Payer: Self-pay | Admitting: Emergency Medicine

## 2020-09-30 ENCOUNTER — Telehealth (HOSPITAL_COMMUNITY): Payer: Self-pay | Admitting: Physical Therapy

## 2020-09-30 ENCOUNTER — Telehealth (HOSPITAL_COMMUNITY): Payer: Self-pay

## 2020-09-30 NOTE — Telephone Encounter (Signed)
he states he is doing very well and will have to cx this apptment b/c he will see his MD at 10am. on 11/2

## 2020-09-30 NOTE — Telephone Encounter (Signed)
Lm  pt to cx this apptment - Cjc will be out of the office- R/s for 12/08/19

## 2020-10-01 ENCOUNTER — Encounter (HOSPITAL_COMMUNITY): Payer: Self-pay | Admitting: Physical Therapy

## 2020-10-01 ENCOUNTER — Other Ambulatory Visit: Payer: Self-pay

## 2020-10-01 ENCOUNTER — Ambulatory Visit (HOSPITAL_COMMUNITY): Payer: Medicare Other | Admitting: Physical Therapy

## 2020-10-01 DIAGNOSIS — R262 Difficulty in walking, not elsewhere classified: Secondary | ICD-10-CM | POA: Diagnosis not present

## 2020-10-01 DIAGNOSIS — M25651 Stiffness of right hip, not elsewhere classified: Secondary | ICD-10-CM | POA: Diagnosis not present

## 2020-10-01 DIAGNOSIS — M6281 Muscle weakness (generalized): Secondary | ICD-10-CM

## 2020-10-01 DIAGNOSIS — M25551 Pain in right hip: Secondary | ICD-10-CM | POA: Diagnosis not present

## 2020-10-02 NOTE — Therapy (Signed)
[place], [place], [address] Phone: [phone]   Fax:  [phone]  Physical Therapy Treatment  Patient Details  Name: Stanley Kelly MRN: [id number] Date of Birth: 1951/09/06 Referring Provider (PT): Dorna Leitz    Encounter Date: 10/01/2020   PT End of Session - 10/01/20 0957    Visit Number 2    Number of Visits 12    Date for PT Re-Evaluation 11/07/20    Authorization Type medicare and [employer]    Progress Note Due on Visit 10    PT Start Time [phone]    PT Stop Time 1034    PT Time Calculation (min) 42 min    Activity Tolerance Patient tolerated treatment well    Behavior During Therapy [facility] for tasks assessed/performed           Past Medical History:  Diagnosis Date  . Allergy   . Arthritis    "knees" (08/13/2016)  . GERD (gastroesophageal reflux disease)   . Kidney stones    "no OR" (08/13/2016)    Past Surgical History:  Procedure Laterality Date  . INJECTION KNEE Bilateral 08/18/2015   Procedure: BILATERAL TOTAL KNEE BILATERAL CORTISONE INJECTION;  Surgeon: Dorna Leitz, MD;  Location: [place];  Service: Orthopedics;  Laterality: Bilateral;  . JOINT REPLACEMENT    . KNEE ARTHROSCOPY Bilateral    "2 on my right; 1 on the left"  . KNEE SURGERY  ~ 1982   "opened it up, cleaned it out after it had locked up"  . TONSILLECTOMY     "I think"  . TOTAL HIP ARTHROPLASTY Left 08/18/2015   Procedure: TOTAL HIP ARTHROPLASTY ANTERIOR APPROACH;  Surgeon: Dorna Leitz, MD;  Location: [place];  Service: Orthopedics;  Laterality: Left;  . TOTAL KNEE ARTHROPLASTY Right 07/05/2016   Procedure: RIGHT TOTAL KNEE ARTHROPLASTY;  Surgeon: Dorna Leitz, MD;  Location: [place];  Service: Orthopedics;  Laterality: Right;  . TOTAL KNEE ARTHROPLASTY Left 08/13/2016  . TOTAL KNEE ARTHROPLASTY Left 08/13/2016   Procedure: TOTAL KNEE ARTHROPLASTY;  Surgeon: Dorna Leitz, MD;  Location: [place];  Service: Orthopedics;  Laterality: Left;   Marland Kitchen VASECTOMY  1985    There were no vitals filed for this visit.   Subjective Assessment - 10/01/20 0956    Subjective Patient says he feels good overall. Says he may have overdone it since last visit. Has been walking a lot and had to change a tire the other day. Says his legs were sore, but he has no pain currently.    Pertinent History B TKR; Rt THR    How long can you sit comfortably? 20-30 minutes    How long can you stand comfortably? 30 minutes    How long can you walk comfortably? 10 minutes with rolling walker    Patient Stated Goals walk without an assistive device, less pain, no limp while he is walking    Currently in Pain? No/denies    Pain Onset In the past 7 days             10/01/20 0001  Knee/Hip Exercises: Standing  Heel Raises Both;15 reps  Other Standing Knee Exercises semi tandem stance solid floor 2 x 30"   Knee/Hip Exercises: Seated  Sit to Sand 10 reps  Knee/Hip Exercises: Supine  Heel Slides Right;10 reps  Other Supine Knee/Hip Exercises glute set 10 x 5"   Quad Sets Right;10 reps  Quad Sets Limitations 5" hold  Bridges 10  reps  Bridges Limitations 5" holds       PT Education - 10/02/20 1724    Education Details on therapy goals, POC and HEP    Person(s) Educated Patient    Methods Explanation    Comprehension Verbalized understanding            PT Short Term Goals - 09/26/20 1119      PT SHORT TERM GOAL #1   Title Pt to be completeing his HEP to progress to walking in his home with a cane    Time 3    Period Weeks    Status New    Target Date 10/17/20      PT SHORT TERM GOAL #2   Title Pt Rt hip flexion to be to 60 degrees to be able to sit on couches without difficulty    Time 3    Period Weeks    Status New      PT SHORT TERM GOAL #3   Title Pt hip pain to be no greater than a 3/10 to be able to be standing/walking for over an hour to be able to complete shopping without increased pain.    Time 3    Period Weeks    Status  New             PT Long Term Goals - 10/01/20 1009      PT LONG TERM GOAL #1   Title Pt to be walking without a cane with normalized gait    Time 6    Period Weeks    Status New      PT LONG TERM GOAL #2   Title PT RT hip ROM to be to 110 to be able to squat down and pick items off the ground.    Time 6    Period Weeks    Status New      PT LONG TERM GOAL #3   Title Pt SLS on both legs to be up to 30 seconds to allow pt to feel confident walking on uneven terrain without an assistive device.     Time 6    Period Weeks    Status New      PT LONG TERM GOAL #4   Title PT RT hip strength to be at least a 4+/5 to be able to rise from a squatted position without UE assist  to allow pt to pick items off of the floor.    Time 6    Period Weeks    Status New                 Plan - 10/02/20 1722    Clinical Impression Statement Patient tolerated session well today with no increased complaint of pain. Patient showed good carry over with form for previously added ther ex. Patient cued on appropriate hold times for target muscle activation. Progressed patient to standing LE strengthening exercises and static balance activity. Patient educated on proper form and function of added exercise. Patient shows very good static balance, and is now able to ambulate comfortably with no AD.    Personal Factors and Comorbidities Comorbidity 2;Fitness    Comorbidities Lt THR; B TKR    Examination-Activity Limitations Bathing;Bed Mobility;Caring for Others;Carry;Dressing;Lift;Locomotion Level;Reach Overhead;Sit;Stairs;Stand;Toileting    Examination-Participation Restrictions Cleaning;Community Activity;Driving;Meal Prep;Shop;Yard Work    Merchant [employer] officer Evolving/Moderate complexity    Rehab Potential Good    PT Frequency 2x / week    PT Duration 6 weeks  PT Treatment/Interventions DME Instruction;Gait training;Stair training;Functional mobility training;Therapeutic  activities;Therapeutic exercise;Balance training;Patient/family education;Manual techniques    PT Next Visit Plan Continue to progress LE functional strengthening as tolerated. Add step ups and standing hip abduction next visit. Progress gait training and balance activities as tolerated.    PT Home Exercise Plan sit to stand, heel raise, minisquat, heelslide, glut set    Consulted and Agree with Plan of Care Patient           Patient will benefit from skilled therapeutic intervention in order to improve the following deficits and impairments:  Decreased activity tolerance, Decreased balance, Decreased mobility, Decreased range of motion, Difficulty walking, Decreased strength, Pain, Increased edema  Visit Diagnosis: Pain in right hip  Stiffness of right hip, not elsewhere classified  Difficulty in walking, not elsewhere classified  Muscle weakness (generalized)     Problem List Patient Active Problem List   Diagnosis Date Noted  . S/P knee replacement 07/05/2016  . Primary osteoarthritis of left hip 08/18/2015  . Primary osteoarthritis of right knee 08/18/2015  . Primary osteoarthritis of left knee 08/18/2015  . History of osteoarthritis 10/07/2014  . Overweight(278.02) 05/16/2012   5:25 PM, 10/02/20 Josue Hector PT DPT  Physical Therapist with [facility]  [phone] [place] [address] [place], [place], [address] Phone: [phone]   Fax:  [phone]  Name: Stanley Kelly MRN: [id number] Date of Birth: 1951/04/09

## 2020-10-07 ENCOUNTER — Encounter (HOSPITAL_COMMUNITY): Payer: Medicare Other | Admitting: Physical Therapy

## 2020-10-07 DIAGNOSIS — Z471 Aftercare following joint replacement surgery: Secondary | ICD-10-CM | POA: Diagnosis not present

## 2020-10-07 DIAGNOSIS — Z96641 Presence of right artificial hip joint: Secondary | ICD-10-CM | POA: Diagnosis not present

## 2020-10-08 ENCOUNTER — Ambulatory Visit (HOSPITAL_COMMUNITY): Payer: Medicare Other

## 2020-10-09 ENCOUNTER — Encounter (HOSPITAL_COMMUNITY): Payer: Medicare Other | Admitting: Physical Therapy

## 2020-10-13 ENCOUNTER — Encounter (HOSPITAL_COMMUNITY): Payer: Self-pay | Admitting: Physical Therapy

## 2020-10-13 ENCOUNTER — Ambulatory Visit (HOSPITAL_COMMUNITY): Payer: Medicare Other | Attending: Orthopedic Surgery | Admitting: Physical Therapy

## 2020-10-13 ENCOUNTER — Other Ambulatory Visit: Payer: Self-pay

## 2020-10-13 DIAGNOSIS — R262 Difficulty in walking, not elsewhere classified: Secondary | ICD-10-CM | POA: Insufficient documentation

## 2020-10-13 DIAGNOSIS — M6281 Muscle weakness (generalized): Secondary | ICD-10-CM | POA: Diagnosis not present

## 2020-10-13 DIAGNOSIS — M25551 Pain in right hip: Secondary | ICD-10-CM | POA: Insufficient documentation

## 2020-10-13 DIAGNOSIS — M25651 Stiffness of right hip, not elsewhere classified: Secondary | ICD-10-CM | POA: Insufficient documentation

## 2020-10-13 NOTE — Patient Instructions (Signed)
Access Code: [email or web address] URL: [email or web address] Date: 10/13/2020 Prepared by: Josue Hector  Exercises Standing Hip Abduction with Counter Support - 1-2 x daily - 7 x weekly - 2 sets - 10 reps Standing Single Leg Stance with Counter Support - 1-2 x daily - 7 x weekly - 1 sets - 3 reps - 20-30 seconds hold Standing Knee Flexion Stretch on Step - 1-2 x daily - 7 x weekly - 1 sets - 5 reps - 10 seconds hold Forward Step Up with Counter Support - 1-2 x daily - 7 x weekly - 2 sets - 10 reps

## 2020-10-13 NOTE — Therapy (Signed)
[place], [place], [address] Phone: [phone]   Fax:  [phone]  Physical Therapy Treatment  Patient Details  Name: Stanley Kelly MRN: [id number] Date of Birth: 1951/08/01 Referring Provider (PT): Dorna Leitz    Encounter Date: 10/13/2020   PT End of Session - 10/13/20 0909    Visit Number 3    Number of Visits 12    Date for PT Re-Evaluation 11/07/20    Authorization Type medicare and [employer]    Progress Note Due on Visit 10    PT Start Time 0905    PT Stop Time 0943    PT Time Calculation (min) 38 min    Activity Tolerance Patient tolerated treatment well    Behavior During Therapy [facility] for tasks assessed/performed           Past Medical History:  Diagnosis Date  . Allergy   . Arthritis    "knees" (08/13/2016)  . GERD (gastroesophageal reflux disease)   . Kidney stones    "no OR" (08/13/2016)    Past Surgical History:  Procedure Laterality Date  . INJECTION KNEE Bilateral 08/18/2015   Procedure: BILATERAL TOTAL KNEE BILATERAL CORTISONE INJECTION;  Surgeon: Dorna Leitz, MD;  Location: [place];  Service: Orthopedics;  Laterality: Bilateral;  . JOINT REPLACEMENT    . KNEE ARTHROSCOPY Bilateral    "2 on my right; 1 on the left"  . KNEE SURGERY  ~ 1982   "opened it up, cleaned it out after it had locked up"  . TONSILLECTOMY     "I think"  . TOTAL HIP ARTHROPLASTY Left 08/18/2015   Procedure: TOTAL HIP ARTHROPLASTY ANTERIOR APPROACH;  Surgeon: Dorna Leitz, MD;  Location: [place];  Service: Orthopedics;  Laterality: Left;  . TOTAL KNEE ARTHROPLASTY Right 07/05/2016   Procedure: RIGHT TOTAL KNEE ARTHROPLASTY;  Surgeon: Dorna Leitz, MD;  Location: [place];  Service: Orthopedics;  Laterality: Right;  . TOTAL KNEE ARTHROPLASTY Left 08/13/2016  . TOTAL KNEE ARTHROPLASTY Left 08/13/2016   Procedure: TOTAL KNEE ARTHROPLASTY;  Surgeon: Dorna Leitz, MD;  Location: [place];  Service: Orthopedics;  Laterality: Left;  Marland Kitchen  VASECTOMY  1985    There were no vitals filed for this visit.   Subjective Assessment - 10/13/20 0908    Subjective Patient reports no new issues. Says he is not having any pain. Has been exercising daily, walks every day, rides stationary bike every day. Things are going well, no complaints.    Pertinent History B TKR; Rt THR    How long can you sit comfortably? 20-30 minutes    How long can you stand comfortably? 30 minutes    How long can you walk comfortably? 10 minutes with rolling walker    Patient Stated Goals walk without an assistive device, less pain, no limp while he is walking    Currently in Pain? No/denies    Pain Onset In the past 7 days                             [facility] Adult PT Treatment/Exercise - 10/13/20 0001      Knee/Hip Exercises: Standing   Heel Raises Both;2 sets;10 reps    Hip Abduction Both;2 sets;10 reps    Lateral Step Up Both;1 set;10 reps;Hand Hold: 1;Step Height: 4"    Forward Step Up Both;1 set;15 reps;Hand Hold: 1;Step Height: 4"    SLS 3 x 20"  each on solid floor     Other Standing Knee Exercises tandem stance 3 x 30" solid floor      Knee/Hip Exercises: Seated   Sit to Sand 15 reps   noted crepitus      Knee/Hip Exercises: Supine   Quad Sets 10 reps;Both    Quad Sets Limitations 5" hold     Bridges 10 reps    Bridges Limitations 5" hold     Straight Leg Raises 10 reps;Right                    PT Short Term Goals - 09/26/20 1119      PT SHORT TERM GOAL #1   Title Pt to be completeing his HEP to progress to walking in his home with a cane    Time 3    Period Weeks    Status New    Target Date 10/17/20      PT SHORT TERM GOAL #2   Title Pt Rt hip flexion to be to 60 degrees to be able to sit on couches without difficulty    Time 3    Period Weeks    Status New      PT SHORT TERM GOAL #3   Title Pt hip pain to be no greater than a 3/10 to be able to be standing/walking for over an hour to be able to  complete shopping without increased pain.    Time 3    Period Weeks    Status New             PT Long Term Goals - 10/01/20 1009      PT LONG TERM GOAL #1   Title Pt to be walking without a cane with normalized gait    Time 6    Period Weeks    Status New      PT LONG TERM GOAL #2   Title PT RT hip ROM to be to 110 to be able to squat down and pick items off the ground.    Time 6    Period Weeks    Status New      PT LONG TERM GOAL #3   Title Pt SLS on both legs to be up to 30 seconds to allow pt to feel confident walking on uneven terrain without an assistive device.     Time 6    Period Weeks    Status New      PT LONG TERM GOAL #4   Title PT RT hip strength to be at least a 4+/5 to be able to rise from a squatted position without UE assist  to allow pt to pick items off of the floor.    Time 6    Period Weeks    Status New                 Plan - 10/13/20 0939    Clinical Impression Statement Patient demos good progress toward therapy goals. Patient shows good activity tolerance and good improvements in static balance. Patient continues to be limited by mild AROM deficits, and weakness. Progressed standing hip strengthening activity today, patient tolerated well. Patient educated on and issued updated HEP handout. Patient will continue to benefit from skilled therapy to progress strength and mobility to improve functional mobility and ADLs.    Personal Factors and Comorbidities Comorbidity 2;Fitness    Comorbidities Lt THR; B TKR    Examination-Activity Limitations Bathing;Bed Mobility;Caring for Others;Carry;Dressing;Lift;Locomotion  Level;Reach Overhead;Sit;Stairs;Stand;Toileting    Examination-Participation Restrictions Cleaning;Community Activity;Driving;Meal Prep;Shop;Yard Work    Merchant navy officer Evolving/Moderate complexity    Rehab Potential Good    PT Frequency 2x / week    PT Duration 6 weeks    PT Treatment/Interventions DME  Instruction;Gait training;Stair training;Functional mobility training;Therapeutic activities;Therapeutic exercise;Balance training;Patient/family education;Manual techniques    PT Next Visit Plan Continue to progress LE functional strengthening as tolerated. Increase step height, add foam to balance. Progress gait training and balance activities as tolerated.    PT Home Exercise Plan sit to stand, heel raise, minisquat, heelslide, glut set 10/13/20: standing hip abd, hip flexion stretch, SLS at sink, step ups    Consulted and Agree with Plan of Care Patient           Patient will benefit from skilled therapeutic intervention in order to improve the following deficits and impairments:  Decreased activity tolerance, Decreased balance, Decreased mobility, Decreased range of motion, Difficulty walking, Decreased strength, Pain, Increased edema  Visit Diagnosis: Pain in right hip  Stiffness of right hip, not elsewhere classified  Difficulty in walking, not elsewhere classified  Muscle weakness (generalized)     Problem List Patient Active Problem List   Diagnosis Date Noted  . S/P knee replacement 07/05/2016  . Primary osteoarthritis of left hip 08/18/2015  . Primary osteoarthritis of right knee 08/18/2015  . Primary osteoarthritis of left knee 08/18/2015  . History of osteoarthritis 10/07/2014  . Overweight(278.02) 05/16/2012   9:47 AM, 10/13/20 Josue Hector PT DPT  Physical Therapist with [facility]  [phone] [place] [address] [place], [place], [address] Phone: [phone]   Fax:  [phone]  Name: Stanley Kelly MRN: [id number] Date of Birth: 1951/06/23

## 2020-10-16 ENCOUNTER — Other Ambulatory Visit: Payer: Self-pay

## 2020-10-16 ENCOUNTER — Ambulatory Visit (HOSPITAL_COMMUNITY): Payer: Medicare Other | Admitting: Physical Therapy

## 2020-10-16 DIAGNOSIS — M25551 Pain in right hip: Secondary | ICD-10-CM | POA: Diagnosis not present

## 2020-10-16 DIAGNOSIS — R262 Difficulty in walking, not elsewhere classified: Secondary | ICD-10-CM | POA: Diagnosis not present

## 2020-10-16 DIAGNOSIS — M6281 Muscle weakness (generalized): Secondary | ICD-10-CM | POA: Diagnosis not present

## 2020-10-16 DIAGNOSIS — M25651 Stiffness of right hip, not elsewhere classified: Secondary | ICD-10-CM

## 2020-10-16 NOTE — Therapy (Signed)
[place], [place], [address] Phone: [phone]   Fax:  [phone]  Physical Therapy Treatment  Patient Details  Name: Stanley Kelly MRN: [id number] Date of Birth: [birth year] Referring Provider (PT): Dorna Leitz    PHYSICAL THERAPY DISCHARGE SUMMARY  Visits from Start of Care: 4  Current functional level related to goals / functional outcomes: See below    Remaining deficits: Lt hip weakness    Education / Equipment: HEP Plan: Patient agrees to discharge.  Patient goals were met. Patient is being discharged due to being pleased with the current functional level.  ?????      Encounter Date: 10/16/2020   PT End of Session - 10/16/20 1452    Visit Number 4    Number of Visits 4    Date for PT Re-Evaluation 11/07/20    Authorization Type medicare and [employer]    Progress Note Due on Visit 10    PT Start Time 6433    PT Stop Time 1530    PT Time Calculation (min) 45 min    Activity Tolerance Patient tolerated treatment well    Behavior During Therapy [facility] for tasks assessed/performed           Past Medical History:  Diagnosis Date  . Allergy   . Arthritis    "knees" (08/13/2016)  . GERD (gastroesophageal reflux disease)   . Kidney stones    "no OR" (08/13/2016)    Past Surgical History:  Procedure Laterality Date  . INJECTION KNEE Bilateral 08/18/2015   Procedure: BILATERAL TOTAL KNEE BILATERAL CORTISONE INJECTION;  Surgeon: Dorna Leitz, MD;  Location: [place];  Service: Orthopedics;  Laterality: Bilateral;  . JOINT REPLACEMENT    . KNEE ARTHROSCOPY Bilateral    "2 on my right; 1 on the left"  . KNEE SURGERY  ~ 1982   "opened it up, cleaned it out after it had locked up"  . TONSILLECTOMY     "I think"  . TOTAL HIP ARTHROPLASTY Left 08/18/2015   Procedure: TOTAL HIP ARTHROPLASTY ANTERIOR APPROACH;  Surgeon: Dorna Leitz, MD;  Location: [place];  Service: Orthopedics;  Laterality: Left;  .  TOTAL KNEE ARTHROPLASTY Right 07/05/2016   Procedure: RIGHT TOTAL KNEE ARTHROPLASTY;  Surgeon: Dorna Leitz, MD;  Location: [place];  Service: Orthopedics;  Laterality: Right;  . TOTAL KNEE ARTHROPLASTY Left 08/13/2016  . TOTAL KNEE ARTHROPLASTY Left 08/13/2016   Procedure: TOTAL KNEE ARTHROPLASTY;  Surgeon: Dorna Leitz, MD;  Location: [place];  Service: Orthopedics;  Laterality: Left;  Marland Kitchen VASECTOMY  1985    There were no vitals filed for this visit.       [facility] PT Assessment - 10/16/20 0001      Assessment   Medical Diagnosis Rt THR     Referring Provider (PT) Dorna Leitz     Onset Date/Surgical Date 09/24/20    Next MD Visit 10/07/2020    Prior Therapy not for this       Precautions   Precautions None      Restrictions   Weight Bearing Restrictions No      Home Environment   Living Environment Private residence    Home Access Stairs to enter    Entrance Stairs-Number of Steps [place] One level      Prior Function   Level of Independence Independent    Vocation Retired    Leisure yard work, Training and development officer , clean  Cognition   Overall Cognitive Status Within Functional Limits for tasks assessed      Observation/Other Assessments   Focus on Therapeutic Outcomes (FOTO)  72 was 28;       Functional Tests   Functional tests Single leg stance;Sit to Stand      Single Leg Stance   Comments Lt 60 "; RT: 49      Sit to Stand   Comments  B UE assist 4 x in 30 seconds       AROM   Right Hip Flexion 50      Strength   Strength Assessment Site Hip;Knee;Ankle    Right/Left Hip Right;Left    Right Hip Flexion 5/5    Right Hip Extension 3/5    Right Hip ABduction 5/5    Left Hip Flexion 3/5    Left Hip Extension 3-/5    Left Hip ABduction 5/5    Right Knee Flexion 5/5    Right Knee Extension 5/5    Left Knee Flexion 5/5    Right Ankle Dorsiflexion 4+/5    Left Ankle Dorsiflexion 5/5      Ambulation/Gait   Ambulation Distance (Feet) 678 Feet    Assistive device  Rolling walker;None                         [facility] Adult PT Treatment/Exercise - 10/16/20 0001      Bed Mobility   Bed Mobility Sit to Supine    Sit to Supine Minimal Assistance - Patient > 75%   needs assist getting leg onto bed      Ambulation/Gait   Gait Comments 3 minute walk test       Exercises   Exercises Knee/Hip      Knee/Hip Exercises: Stretches   Other Knee/Hip Stretches knee to chest       Knee/Hip Exercises: Standing   Heel Raises --    Functional Squat 5 reps    Functional Squat Limitations to floor to pick an item up     SLS Lt 60" Rt 49      Knee/Hip Exercises: Seated   Sit to Sand 5 reps      Knee/Hip Exercises: Supine   Heel Slides 5 reps    Other Supine Knee/Hip Exercises --                    PT Short Term Goals - 10/16/20 1507      PT SHORT TERM GOAL #1   Title Pt to be completeing his HEP to progress to walking in his home with a cane    Time 3    Period Weeks    Status Achieved    Target Date 10/17/20      PT SHORT TERM GOAL #2   Title Pt Rt hip flexion to be to 60 degrees to be able to sit on couches without difficulty    Baseline now 100    Time 3    Period Weeks    Status Achieved      PT SHORT TERM GOAL #3   Title Pt hip pain to be no greater than a 3/10 to be able to be standing/walking for over an hour to be able to complete shopping without increased pain.    Time 3    Period Weeks    Status Achieved      PT SHORT TERM GOAL #4   Status Achieved  PT Long Term Goals - 10/16/20 1509      PT LONG TERM GOAL #1   Title Pt to be walking without a cane with normalized gait    Time 6    Period Weeks    Status Achieved      PT LONG TERM GOAL #2   Title PT RT hip ROM to be to 110 to be able to squat down and pick items off the ground.    Time 6    Period Weeks    Status Achieved      PT LONG TERM GOAL #3   Title Pt SLS on both legs to be up to 30 seconds to allow pt to feel confident  walking on uneven terrain without an assistive device.     Time 6    Period Weeks    Status Achieved      PT LONG TERM GOAL #4   Title PT RT hip strength to be at least a 4+/5 to be able to rise from a squatted position without UE assist  to allow pt to pick items off of the floor.    Time 6    Period Weeks    Status On-going                 Plan - 10/16/20 1525    Clinical Impression Statement Pt comes into department with no assistive device smiling stating that he feels that he is ready for discharge.  States that he is walking his dog everyday able to go up and down his steps from his deck and is doing his houswork all without difficulty.  PT reassessed and Lt LE acturally demonstrates greater weakness than RT>  PT feels he will be able to work on his defecits at home on his own. Therapist agrees with discharge.    Personal Factors and Comorbidities Comorbidity 2;Fitness    Comorbidities Lt THR; B TKR    Examination-Activity Limitations Bathing;Bed Mobility;Caring for Others;Carry;Dressing;Lift;Locomotion Level;Reach Overhead;Sit;Stairs;Stand;Toileting    Examination-Participation Restrictions Cleaning;Community Activity;Driving;Meal Prep;Shop;Yard Work    Merchant navy officer Evolving/Moderate complexity    Rehab Potential Good    PT Frequency 2x / week    PT Duration 6 weeks    PT Treatment/Interventions DME Instruction;Gait training;Stair training;Functional mobility training;Therapeutic activities;Therapeutic exercise;Balance training;Patient/family education;Manual techniques    PT Next Visit Plan Discharge    PT Home Exercise Plan sit to stand, heel raise, minisquat, heelslide, glut set 10/13/20: standing hip abd, hip flexion stretch, SLS at sink, step ups    Consulted and Agree with Plan of Care Patient           Patient will benefit from skilled therapeutic intervention in order to improve the following deficits and impairments:  Decreased activity  tolerance, Decreased balance, Decreased mobility, Decreased range of motion, Difficulty walking, Decreased strength, Pain, Increased edema  Visit Diagnosis: Pain in right hip  Stiffness of right hip, not elsewhere classified  Difficulty in walking, not elsewhere classified     Problem List Patient Active Problem List   Diagnosis Date Noted  . S/P knee replacement 07/05/2016  . Primary osteoarthritis of left hip 08/18/2015  . Primary osteoarthritis of right knee 08/18/2015  . Primary osteoarthritis of left knee 08/18/2015  . History of osteoarthritis 10/07/2014  . Overweight(278.02) 05/16/2012    Rayetta Humphrey, PT CLT [phone] 10/16/2020, 3:28 PM  [place] [address] [place], [place], [address] Phone: [phone]   Fax:  [phone]  Name: ANDRA MATSUO MRN: [id number] Date of Birth: 01/01/1951

## 2020-10-21 ENCOUNTER — Ambulatory Visit (HOSPITAL_COMMUNITY): Payer: Medicare Other | Admitting: Physical Therapy

## 2020-10-23 ENCOUNTER — Encounter (HOSPITAL_COMMUNITY): Payer: Medicare Other

## 2020-10-27 ENCOUNTER — Encounter (HOSPITAL_COMMUNITY): Payer: Medicare Other | Admitting: Physical Therapy

## 2020-10-29 ENCOUNTER — Encounter (HOSPITAL_COMMUNITY): Payer: Medicare Other | Admitting: Physical Therapy

## 2020-11-04 ENCOUNTER — Encounter (HOSPITAL_COMMUNITY): Payer: Medicare Other

## 2020-11-06 ENCOUNTER — Encounter (HOSPITAL_COMMUNITY): Payer: Medicare Other

## 2020-12-01 ENCOUNTER — Ambulatory Visit (INDEPENDENT_AMBULATORY_CARE_PROVIDER_SITE_OTHER): Payer: Medicare Other | Admitting: Emergency Medicine

## 2020-12-01 VITALS — BP 142/81 | Ht 68.0 in | Wt 240.0 lb

## 2020-12-01 DIAGNOSIS — Z0000 Encounter for general adult medical examination without abnormal findings: Secondary | ICD-10-CM

## 2020-12-01 NOTE — Progress Notes (Signed)
Presents today for [employer] Visit   Date of last exam: 09-04-2020  Interpreter used for this visit?   I connected with  Stanley Kelly on 12/01/20 by a telephone application and verified that I am speaking with the correct person using two identifiers.   I discussed the limitations of evaluation and management by telemedicine. The patient expressed understanding and agreed to proceed.  Patient location: home  Provider location: in office  I provided 20 minutes of non face - to - face time during this encounter.  Patient Care Team: Horald Pollen, MD as PCP - General (Internal Medicine)   Other items to address today:  Discussed Eye/Dental Discussed Immunizations Follow up schedule Sagardia 1-12 @ 8:40    Other Screening: Last screening for diabetes: 09/04/2020 Last lipid screening: 06/18/2020  ADVANCE DIRECTIVES: Discussed: yes On File: no Materials Provided: no  Immunization status:  Immunization History  Administered Date(s) Administered  . Influenza Split 09/17/2012  . Influenza-Unspecified 09/23/2014, 07/23/2016, 09/05/2020  . Moderna Sars-Covid-2 Vaccination 03/12/2020  . PFIZER SARS-COV-2 Vaccination 11/25/2020  . Pneumococcal Conjugate-13 07/29/2018  . Pneumococcal Polysaccharide-23 08/06/2019  . Tdap 05/16/2012  . Zoster 10/14/2014     There are no preventive care reminders to display for this patient.   Functional Status Survey: Is the patient deaf or have difficulty hearing?: No Does the patient have difficulty seeing, even when wearing glasses/contacts?: No Does the patient have difficulty concentrating, remembering, or making decisions?: No Does the patient have difficulty walking or climbing stairs?: No Does the patient have difficulty dressing or bathing?: No Does the patient have difficulty doing errands alone such as visiting a doctor's office or shopping?: No   6CIT Screen 12/01/2020  What Year? 0 points  What  month? 0 points  What time? 0 points  Count back from 20 0 points  Months in reverse 0 points  Repeat phrase 0 points  Total Score 0      Flowsheet Row Clinical Support from 12/01/2020 in [facility]  AUDIT-C Score 0       Home Environment:   Lives in one story home with wife No scattered rugs Yes grab bars Adequate lighting/ no clutter No trouble climbing stairs   Patient Active Problem List   Diagnosis Date Noted  . S/P knee replacement 07/05/2016  . Primary osteoarthritis of left hip 08/18/2015  . Primary osteoarthritis of right knee 08/18/2015  . Primary osteoarthritis of left knee 08/18/2015  . History of osteoarthritis 10/07/2014  . Overweight(278.02) 05/16/2012     Past Medical History:  Diagnosis Date  . Allergy   . Arthritis    "knees" (08/13/2016)  . GERD (gastroesophageal reflux disease)   . Kidney stones    "no OR" (08/13/2016)     Past Surgical History:  Procedure Laterality Date  . INJECTION KNEE Bilateral 08/18/2015   Procedure: BILATERAL TOTAL KNEE BILATERAL CORTISONE INJECTION;  Surgeon: Dorna Leitz, MD;  Location: [facility];  Service: Orthopedics;  Laterality: Bilateral;  . JOINT REPLACEMENT    . KNEE ARTHROSCOPY Bilateral    "2 on my right; 1 on the left"  . KNEE SURGERY  ~ 1982   "opened it up, cleaned it out after it had locked up"  . TONSILLECTOMY     "I think"  . TOTAL HIP ARTHROPLASTY Left 08/18/2015   Procedure: TOTAL HIP ARTHROPLASTY ANTERIOR APPROACH;  Surgeon: Dorna Leitz, MD;  Location: [facility];  Service: Orthopedics;  Laterality: Left;  .  TOTAL KNEE ARTHROPLASTY Right 07/05/2016   Procedure: RIGHT TOTAL KNEE ARTHROPLASTY;  Surgeon: Jodi Geralds, MD;  Location: [facility];  Service: Orthopedics;  Laterality: Right;  . TOTAL KNEE ARTHROPLASTY Left 08/13/2016  . TOTAL KNEE ARTHROPLASTY Left 08/13/2016   Procedure: TOTAL KNEE ARTHROPLASTY;  Surgeon: Jodi Geralds, MD;  Location: [facility];  Service: Orthopedics;  Laterality: Left;  Marland Kitchen VASECTOMY   1985     Family History  Adopted: Yes     Social History   Socioeconomic History  . Marital status: Married    Spouse name: Not on file  . Number of children: Not on file  . Years of education: Not on file  . Highest education level: Not on file  Occupational History  . Not on file  Tobacco Use  . Smoking status: Never Smoker  . Smokeless tobacco: Never Used  Vaping Use  . Vaping Use: Never used  Substance and Sexual Activity  . Alcohol use: No    Alcohol/week: 0.0 standard drinks  . Drug use: No  . Sexual activity: Not Currently  Other Topics Concern  . Not on file  Social History Narrative   Box board in [place]   Lives with wife   Children - step children x3   Seat belt - 100%   Gun in home - secured   Social Determinants of Health   Financial Resource Strain: Not on file  Food Insecurity: Not on file  Transportation Needs: Not on file  Physical Activity: Not on file  Stress: Not on file  Social Connections: Not on file  Intimate Partner Violence: Not on file     Allergies  Allergen Reactions  . No Known Allergies   . Other      Prior to Admission medications   Medication Sig Start Date End Date Taking? Authorizing Provider  aspirin 81 MG tablet Take 81 mg by mouth daily.   Yes [provider]  esomeprazole (NEXIUM) 20 MG capsule Take 20 mg by mouth daily at 12 noon.   Yes [provider]  meloxicam (MOBIC) 15 MG tablet TAKE 1 TABLET BY MOUTH EVERY DAY 09/29/20  Yes Sagardia, Eilleen Kempf, MD  Multiple Vitamin (MULTIVITAMIN) tablet Take 1 tablet by mouth daily.   Yes [provider]  Multiple Vitamins-Minerals (MULTIVITAMIN WITH MINERALS) tablet Take 1 tablet by mouth daily.    Yes [provider]  Omega-3 Fatty Acids (FISH OIL ADULT GUMMIES PO) Take by mouth daily.   Yes [provider]  OVER THE COUNTER MEDICATION Stool softener, 100 mg. I daily.   Yes [provider]  OVER THE COUNTER  MEDICATION Vitamin -B 12 500 mg. One daily   Yes [provider]  OVER THE COUNTER MEDICATION Potassium 595 mg. One tablet daily.   Yes [provider]  OVER THE COUNTER MEDICATION Vitamin E 400 units one capsule daily.   Yes [provider]  OVER THE COUNTER MEDICATION Vitamin C 100 mg. One tablet daily.   Yes [provider]  OVER THE COUNTER MEDICATION Vitamin D 3 1000 mg. One capsule daily.   Yes [provider]  OVER THE COUNTER MEDICATION 500 mg Niacin. One tablet daily.   Yes [provider]  OVER THE COUNTER MEDICATION Calcium 1200 mg. One capsule daily.   Yes [provider]  Red Yeast Rice Extract (RED YEAST RICE PO) Take by mouth daily.   Yes [provider]  amoxicillin (AMOXIL) 500 MG capsule as needed. Patient not  taking: Reported on 12/01/2020 10/02/18   [provider]     Depression screen [facility] 2/9 12/01/2020 09/04/2020 06/18/2020 10/20/2016 05/17/2016  Decreased Interest 0 0 0 0 0  Down, Depressed, Hopeless 0 0 0 0 0  PHQ - 2 Score 0 0 0 0 0     Fall Risk  12/01/2020 09/04/2020 06/18/2020 05/17/2016 10/07/2014  Falls in the past year? 0 0 0 No No  Number falls in past yr: 0 0 - - -  Injury with Fall? 0 0 - - -  Follow up Falls evaluation completed;Education provided Falls evaluation completed Falls evaluation completed - -      PHYSICAL EXAM: Wt 240 lb (108.9 kg)   BMI 36.49 kg/m    Wt Readings from Last 3 Encounters:  12/01/20 240 lb (108.9 kg)  09/04/20 240 lb (108.9 kg)  06/18/20 237 lb (107.5 kg)       Education/Counseling provided regarding diet and exercise, prevention of chronic diseases, smoking/tobacco cessation, if applicable, and reviewed "Covered Medicare Preventive Services."

## 2020-12-01 NOTE — Patient Instructions (Addendum)
Thank you for taking time to come for your Medicare Wellness Visit. I appreciate your ongoing commitment to your health goals. Please review the following plan we discussed and let me know if I can assist you in the future.  Stanley Kennedy LPN  Preventive Care 67 Years and Older, Male Preventive care refers to lifestyle choices and visits with your health care provider that can promote health and wellness. This includes:  A yearly physical exam. This is also called an annual well check.  Regular dental and eye exams.  Immunizations.  Screening for certain conditions.  Healthy lifestyle choices, such as diet and exercise. What can I expect for my preventive care visit? Physical exam Your health care provider will check:  Height and weight. These may be used to calculate body mass index (BMI), which is a measurement that tells if you are at a healthy weight.  Heart rate and blood pressure.  Your skin for abnormal spots. Counseling Your health care provider may ask you questions about:  Alcohol, tobacco, and drug use.  Emotional well-being.  Home and relationship well-being.  Sexual activity.  Eating habits.  History of falls.  Memory and ability to understand (cognition).  Work and work Statistician. What immunizations do I need?  Influenza (flu) vaccine  This is recommended every year. Tetanus, diphtheria, and pertussis (Tdap) vaccine  You may need a Td booster every 10 years. Varicella (chickenpox) vaccine  You may need this vaccine if you have not already been vaccinated. Zoster (shingles) vaccine  You may need this after age 67. Pneumococcal conjugate (PCV13) vaccine  One dose is recommended after age 84. Pneumococcal polysaccharide (PPSV23) vaccine  One dose is recommended after age 88. Measles, mumps, and rubella (MMR) vaccine  You may need at least one dose of MMR if you were born in 1957 or later. You may also need a second dose. Meningococcal  conjugate (MenACWY) vaccine  You may need this if you have certain conditions. Hepatitis A vaccine  You may need this if you have certain conditions or if you travel or work in places where you may be exposed to hepatitis A. Hepatitis B vaccine  You may need this if you have certain conditions or if you travel or work in places where you may be exposed to hepatitis B. Haemophilus influenzae type b (Hib) vaccine  You may need this if you have certain conditions. You may receive vaccines as individual doses or as more than one vaccine together in one shot (combination vaccines). Talk with your health care provider about the risks and benefits of combination vaccines. What tests do I need? Blood tests  Lipid and cholesterol levels. These may be checked every 5 years, or more frequently depending on your overall health.  Hepatitis C test.  Hepatitis B test. Screening  Lung cancer screening. You may have this screening every year starting at age 67 if you have a 30-pack-year history of smoking and currently smoke or have quit within the past 15 years.  Colorectal cancer screening. All adults should have this screening starting at age 67 and continuing until age 67. Your health care provider may recommend screening at age 67 if you are at increased risk. You will have tests every 1-10 years, depending on your results and the type of screening test.  Prostate cancer screening. Recommendations will vary depending on your family history and other risks.  Diabetes screening. This is done by checking your blood sugar (glucose) after you have not eaten for  a while (fasting). You may have this done every 1-3 years.  Abdominal aortic aneurysm (AAA) screening. You may need this if you are a current or former smoker.  Sexually transmitted disease (STD) testing. Follow these instructions at home: Eating and drinking  Eat a diet that includes fresh fruits and vegetables, whole grains, lean  protein, and low-fat dairy products. Limit your intake of foods with high amounts of sugar, saturated fats, and salt.  Take vitamin and mineral supplements as recommended by your health care provider.  Do not drink alcohol if your health care provider tells you not to drink.  If you drink alcohol: ? Limit how much you have to 0-2 drinks a day. ? Be aware of how much alcohol is in your drink. In the [place] one drink equals one 12 oz bottle of beer (355 mL), one 5 oz glass of wine (148 mL), or one 1 oz glass of hard liquor (44 mL). Lifestyle  Take daily care of your teeth and gums.  Stay active. Exercise for at least 30 minutes on 5 or more days each week.  Do not use any products that contain nicotine or tobacco, such as cigarettes, e-cigarettes, and chewing tobacco. If you need help quitting, ask your health care provider.  If you are sexually active, practice safe sex. Use a condom or other form of protection to prevent STIs (sexually transmitted infections).  Talk with your health care provider about taking a low-dose aspirin or statin. What's next?  Visit your health care provider once a year for a well check visit.  Ask your health care provider how often you should have your eyes and teeth checked.  Stay up to date on all vaccines. This information is not intended to replace advice given to you by your health care provider. Make sure you discuss any questions you have with your health care provider. Document Revised: 11/16/2018 Document Reviewed: 11/16/2018 Elsevier Patient Education  2020 [employer]  

## 2020-12-17 ENCOUNTER — Encounter: Payer: Self-pay | Admitting: Emergency Medicine

## 2020-12-17 ENCOUNTER — Ambulatory Visit (INDEPENDENT_AMBULATORY_CARE_PROVIDER_SITE_OTHER): Payer: Medicare Other | Admitting: Emergency Medicine

## 2020-12-17 ENCOUNTER — Other Ambulatory Visit: Payer: Self-pay

## 2020-12-17 VITALS — BP 126/77 | HR 59 | Temp 97.7°F | Resp 16 | Ht 68.0 in | Wt 230.0 lb

## 2020-12-17 DIAGNOSIS — R7303 Prediabetes: Secondary | ICD-10-CM

## 2020-12-17 DIAGNOSIS — M159 Polyosteoarthritis, unspecified: Secondary | ICD-10-CM

## 2020-12-17 DIAGNOSIS — M8949 Other hypertrophic osteoarthropathy, multiple sites: Secondary | ICD-10-CM | POA: Diagnosis not present

## 2020-12-17 NOTE — Progress Notes (Signed)
Stanley Kelly 70 y.o.   Chief Complaint  Patient presents with  . Prediabetes    And follow up 6 month chronic medical conditions    HISTORY OF PRESENT ILLNESS: This is a 70 y.o. male with history of prediabetes and osteoarthritis here for follow-up. Fully vaccinated against COVID with a booster. Status post right hip replacement last October.  Did well without complications. Has history of bilateral knee replacements. Eating better and losing weight.  Has no complaints or medical concerns today.  HPI   Prior to Admission medications   Medication Sig Start Date End Date Taking? Authorizing Provider  amoxicillin (AMOXIL) 500 MG capsule as needed. 10/02/18  Yes [provider]  aspirin 81 MG tablet Take 81 mg by mouth daily.   Yes [provider]  esomeprazole (NEXIUM) 20 MG capsule Take 20 mg by mouth daily at 12 noon.   Yes [provider]  meloxicam (MOBIC) 15 MG tablet TAKE 1 TABLET BY MOUTH EVERY DAY 09/29/20  Yes Trumaine Wimer, Ines Bloomer, MD  Multiple Vitamin (MULTIVITAMIN) tablet Take 1 tablet by mouth daily.   Yes [provider]  Omega-3 Fatty Acids (FISH OIL ADULT GUMMIES PO) Take by mouth daily.   Yes [provider]  OVER THE COUNTER MEDICATION Stool softener, 100 mg. I daily.   Yes [provider]  OVER THE COUNTER MEDICATION Vitamin -B 12 500 mg. One daily   Yes [provider]  OVER THE COUNTER MEDICATION Potassium 595 mg. One tablet daily.   Yes [provider]  OVER THE COUNTER MEDICATION Vitamin E 400 units one capsule daily.   Yes [provider]  OVER THE COUNTER MEDICATION Vitamin C 100 mg. One tablet daily.   Yes [provider]  OVER THE COUNTER MEDICATION Vitamin D 3 1000 mg. One capsule daily.   Yes [provider]  OVER THE COUNTER MEDICATION 500 mg Niacin. One tablet daily.   Yes [provider]  OVER THE COUNTER MEDICATION Calcium 1200 mg. One capsule  daily.   Yes [provider]  Red Yeast Rice Extract (RED YEAST RICE PO) Take by mouth daily.   Yes [provider]  Multiple Vitamins-Minerals (MULTIVITAMIN WITH MINERALS) tablet Take 1 tablet by mouth daily.  Patient not taking: Reported on 12/17/2020    [provider]    Allergies  Allergen Reactions  . No Known Allergies   . Other     Patient Active Problem List   Diagnosis Date Noted  . S/P knee replacement 07/05/2016  . Primary osteoarthritis of left hip 08/18/2015  . Primary osteoarthritis of right knee 08/18/2015  . Primary osteoarthritis of left knee 08/18/2015  . History of osteoarthritis 10/07/2014  . Overweight(278.02) 05/16/2012    Past Medical History:  Diagnosis Date  . Allergy   . Arthritis    "knees" (08/13/2016)  . GERD (gastroesophageal reflux disease)   . Kidney stones    "no OR" (08/13/2016)    Past Surgical History:  Procedure Laterality Date  . INJECTION KNEE Bilateral 08/18/2015   Procedure: BILATERAL TOTAL KNEE BILATERAL CORTISONE INJECTION;  Surgeon: Dorna Leitz, MD;  Location: [facility];  Service: Orthopedics;  Laterality: Bilateral;  . JOINT REPLACEMENT    . KNEE ARTHROSCOPY Bilateral    "2 on my right; 1 on the left"  . KNEE SURGERY  ~ 1982   "opened it up, cleaned it out after it had locked up"  . TONSILLECTOMY     "I think"  .  TOTAL HIP ARTHROPLASTY Left 08/18/2015   Procedure: TOTAL HIP ARTHROPLASTY ANTERIOR APPROACH;  Surgeon: Dorna Leitz, MD;  Location: [facility];  Service: Orthopedics;  Laterality: Left;  . TOTAL KNEE ARTHROPLASTY Right 07/05/2016   Procedure: RIGHT TOTAL KNEE ARTHROPLASTY;  Surgeon: Dorna Leitz, MD;  Location: [facility];  Service: Orthopedics;  Laterality: Right;  . TOTAL KNEE ARTHROPLASTY Left 08/13/2016  . TOTAL KNEE ARTHROPLASTY Left 08/13/2016   Procedure: TOTAL KNEE ARTHROPLASTY;  Surgeon: Dorna Leitz, MD;  Location: [facility];  Service: Orthopedics;  Laterality: Left;  Marland Kitchen VASECTOMY  1985    Social History    Socioeconomic History  . Marital status: Married    Spouse name: Not on file  . Number of children: Not on file  . Years of education: Not on file  . Highest education level: Not on file  Occupational History  . Not on file  Tobacco Use  . Smoking status: Never Smoker  . Smokeless tobacco: Never Used  Vaping Use  . Vaping Use: Never used  Substance and Sexual Activity  . Alcohol use: No    Alcohol/week: 0.0 standard drinks  . Drug use: No  . Sexual activity: Not Currently  Other Topics Concern  . 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  Negative for chills and fever.  HENT: Negative.  Negative for congestion and sore throat.   Respiratory: Negative.  Negative for cough and shortness of breath.   Cardiovascular: Negative.  Negative for chest pain and palpitations.  Gastrointestinal: Negative.  Negative for abdominal pain, blood in stool, diarrhea, nausea and vomiting.  Genitourinary: Negative.  Negative for dysuria and hematuria.  Musculoskeletal: Negative.  Negative for back pain, myalgias and neck pain.  Skin: Negative.  Negative for rash.  Neurological: Negative.  Negative for dizziness and headaches.  All other systems reviewed and are negative.   Today's Vitals   12/17/20 0835  BP: 126/77  Pulse: (!) 59  Resp: 16  Temp: 97.7 F (36.5 C)  TempSrc: Temporal  SpO2: 98%  Weight: 230 lb (104.3 kg)  Height: 5\' 8"  (1.727 m)   Body mass index is 34.97 kg/m. Wt Readings from Last 3 Encounters:   12/17/20 230 lb (104.3 kg)  12/01/20 240 lb (108.9 kg)  09/04/20 240 lb (108.9 kg)    Physical Exam Vitals reviewed.  Constitutional:      Appearance: Normal appearance.  HENT:     Head: Normocephalic.  Eyes:     Extraocular Movements: Extraocular movements intact.     Pupils: Pupils are equal, round, and reactive to light.  Cardiovascular:     Rate and Rhythm: Normal rate and regular rhythm.     Pulses: Normal pulses.     Heart sounds: Normal heart sounds.  Pulmonary:     Effort: Pulmonary effort is normal.     Breath sounds: Normal breath sounds.  Musculoskeletal:        General: Normal  range of motion.     Cervical back: Normal range of motion and neck supple.  Skin:    General: Skin is warm and dry.     Capillary Refill: Capillary refill takes less than 2 seconds.  Neurological:     General: No focal deficit present.     Mental Status: He is alert.  Psychiatric:        Mood and Affect: Mood normal.        Behavior: Behavior normal.    A total of 30 minutes was spent with the patient, greater than 50% of which was in counseling/coordination of care regarding prediabetes and cardiovascular risks associated with this condition, education on nutrition, importance of exercise and weight control, review of most recent blood work results, review of most recent office visit notes, documentation prognosis and need for follow-up.   ASSESSMENT & PLAN: Clinically stable.  No medical concerns identified during this visit. Fasting blood work done today. Diet and nutrition discussed. Stanley Kelly was seen today for prediabetes.  Diagnoses and all orders for this visit:  Prediabetes -     Comprehensive metabolic panel -     Hemoglobin A1c  Primary osteoarthritis involving multiple joints    Patient Instructions       If you have lab work done today you will be contacted with your lab results within the next 2 weeks.  If you have not heard from [place] then please contact us. The  fastest way to get your results is to register for My Chart.   IF you received an x-ray today, you will receive an invoice from [facility]. Please contact [facility] at [phone] with questions or concerns regarding your invoice.   IF you received labwork today, you will receive an invoice from [facility]. Please contact [facility] at [phone] with questions or concerns regarding your invoice.   Our billing staff will not be able to assist you with questions regarding bills from these companies.  You will be contacted with the lab results as soon as they are available. The fastest way to get your results is to activate your My Chart account. Instructions are located on the last page of this paperwork. If you have not heard from [place] regarding the results in 2 weeks, please contact this office.     Health Maintenance After Age 78 After age 95, you are at a higher risk for certain long-term diseases and infections as well as injuries from falls. Falls are a major cause of broken bones and head injuries in people who are older than age 13. Getting regular preventive care can help to keep you healthy and well. Preventive care includes getting regular testing and making lifestyle changes as recommended by your health care provider. Talk with your health care provider about:  Which screenings and tests you should have. A screening is a test that checks for a disease when you have no symptoms.  A diet and exercise plan that is right for you. What should I know about screenings and tests to prevent falls? Screening and testing are the best ways to find a health problem early. Early diagnosis and treatment give you the best chance of managing medical conditions that are common after age 36. Certain conditions and lifestyle choices may make you more likely to have a fall. Your health care provider may recommend:  Regular vision checks. Poor vision and conditions such as cataracts can  make you more likely to have a fall. If you wear glasses,  make sure to get your prescription updated if your vision changes.  Medicine review. Work with your health care provider to regularly review all of the medicines you are taking, including over-the-counter medicines. Ask your health care provider about any side effects that may make you more likely to have a fall. Tell your health care provider if any medicines that you take make you feel dizzy or sleepy.  Osteoporosis screening. Osteoporosis is a condition that causes the bones to get weaker. This can make the bones weak and cause them to break more easily.  Blood pressure screening. Blood pressure changes and medicines to control blood pressure can make you feel dizzy.  Strength and balance checks. Your health care provider may recommend certain tests to check your strength and balance while standing, walking, or changing positions.  Foot health exam. Foot pain and numbness, as well as not wearing proper footwear, can make you more likely to have a fall.  Depression screening. You may be more likely to have a fall if you have a fear of falling, feel emotionally low, or feel unable to do activities that you used to do.  Alcohol use screening. Using too much alcohol can affect your balance and may make you more likely to have a fall. What actions can I take to lower my risk of falls? General instructions  Talk with your health care provider about your risks for falling. Tell your health care provider if: ? You fall. Be sure to tell your health care provider about all falls, even ones that seem minor. ? You feel dizzy, sleepy, or off-balance.  Take over-the-counter and prescription medicines only as told by your health care provider. These include any supplements.  Eat a healthy diet and maintain a healthy weight. A healthy diet includes low-fat dairy products, low-fat (lean) meats, and fiber from whole grains, beans, and lots of fruits  and vegetables. Home safety  Remove any tripping hazards, such as rugs, cords, and clutter.  Install safety equipment such as grab bars in bathrooms and safety rails on stairs.  Keep rooms and walkways well-lit. Activity  Follow a regular exercise program to stay fit. This will help you maintain your balance. Ask your health care provider what types of exercise are appropriate for you.  If you need a cane or walker, use it as recommended by your health care provider.  Wear supportive shoes that have nonskid soles.   Lifestyle  Do not drink alcohol if your health care provider tells you not to drink.  If you drink alcohol, limit how much you have: ? 0-1 drink a day for women. ? 0-2 drinks a day for men.  Be aware of how much alcohol is in your drink. In the [place] one drink equals one typical bottle of beer (12 oz), one-half glass of wine (5 oz), or one shot of hard liquor (1 oz).  Do not use any products that contain nicotine or tobacco, such as cigarettes and e-cigarettes. If you need help quitting, ask your health care provider. Summary  Having a healthy lifestyle and getting preventive care can help to protect your health and wellness after age 63.  Screening and testing are the best way to find a health problem early and help you avoid having a fall. Early diagnosis and treatment give you the best chance for managing medical conditions that are more common for people who are older than age 42.  Falls are a major cause of broken bones and head injuries  in people who are older than age 41. Take precautions to prevent a fall at home.  Work with your health care provider to learn what changes you can make to improve your health and wellness and to prevent falls. This information is not intended to replace advice given to you by your health care provider. Make sure you discuss any questions you have with your health care provider. Document Revised: 03/15/2019 Document Reviewed:  10/05/2017 Elsevier Patient Education  2021 Elsevier Inc.     Agustina Caroli, MD [facility]

## 2020-12-17 NOTE — Patient Instructions (Addendum)
   If you have lab work done today you will be contacted with your lab results within the next 2 weeks.  If you have not heard from us then please contact us. The fastest way to get your results is to register for My Chart.   IF you received an x-ray today, you will receive an invoice from [facility]. Please contact [facility] at [phone] with questions or concerns regarding your invoice.   IF you received labwork today, you will receive an invoice from [facility]. Please contact [facility] at [phone] with questions or concerns regarding your invoice.   Our billing staff will not be able to assist you with questions regarding bills from these companies.  You will be contacted with the lab results as soon as they are available. The fastest way to get your results is to activate your My Chart account. Instructions are located on the last page of this paperwork. If you have not heard from us regarding the results in 2 weeks, please contact this office.       Health Maintenance After Age 65 After age 65, you are at a higher risk for certain long-term diseases and infections as well as injuries from falls. Falls are a major cause of broken bones and head injuries in people who are older than age 65. Getting regular preventive care can help to keep you healthy and well. Preventive care includes getting regular testing and making lifestyle changes as recommended by your health care provider. Talk with your health care provider about:  Which screenings and tests you should have. A screening is a test that checks for a disease when you have no symptoms.  A diet and exercise plan that is right for you. What should I know about screenings and tests to prevent falls? Screening and testing are the best ways to find a health problem early. Early diagnosis and treatment give you the best chance of managing medical conditions that are common after age 65. Certain conditions and  lifestyle choices may make you more likely to have a fall. Your health care provider may recommend:  Regular vision checks. Poor vision and conditions such as cataracts can make you more likely to have a fall. If you wear glasses, make sure to get your prescription updated if your vision changes.  Medicine review. Work with your health care provider to regularly review all of the medicines you are taking, including over-the-counter medicines. Ask your health care provider about any side effects that may make you more likely to have a fall. Tell your health care provider if any medicines that you take make you feel dizzy or sleepy.  Osteoporosis screening. Osteoporosis is a condition that causes the bones to get weaker. This can make the bones weak and cause them to break more easily.  Blood pressure screening. Blood pressure changes and medicines to control blood pressure can make you feel dizzy.  Strength and balance checks. Your health care provider may recommend certain tests to check your strength and balance while standing, walking, or changing positions.  Foot health exam. Foot pain and numbness, as well as not wearing proper footwear, can make you more likely to have a fall.  Depression screening. You may be more likely to have a fall if you have a fear of falling, feel emotionally low, or feel unable to do activities that you used to do.  Alcohol use screening. Using too much alcohol can affect your balance and may make you more   likely to have a fall. What actions can I take to lower my risk of falls? General instructions  Talk with your health care provider about your risks for falling. Tell your health care provider if: ? You fall. Be sure to tell your health care provider about all falls, even ones that seem minor. ? You feel dizzy, sleepy, or off-balance.  Take over-the-counter and prescription medicines only as told by your health care provider. These include any  supplements.  Eat a healthy diet and maintain a healthy weight. A healthy diet includes low-fat dairy products, low-fat (lean) meats, and fiber from whole grains, beans, and lots of fruits and vegetables. Home safety  Remove any tripping hazards, such as rugs, cords, and clutter.  Install safety equipment such as grab bars in bathrooms and safety rails on stairs.  Keep rooms and walkways well-lit. Activity  Follow a regular exercise program to stay fit. This will help you maintain your balance. Ask your health care provider what types of exercise are appropriate for you.  If you need a cane or walker, use it as recommended by your health care provider.  Wear supportive shoes that have nonskid soles.   Lifestyle  Do not drink alcohol if your health care provider tells you not to drink.  If you drink alcohol, limit how much you have: ? 0-1 drink a day for women. ? 0-2 drinks a day for men.  Be aware of how much alcohol is in your drink. In the [place] one drink equals one typical bottle of beer (12 oz), one-half glass of wine (5 oz), or one shot of hard liquor (1 oz).  Do not use any products that contain nicotine or tobacco, such as cigarettes and e-cigarettes. If you need help quitting, ask your health care provider. Summary  Having a healthy lifestyle and getting preventive care can help to protect your health and wellness after age 65.  Screening and testing are the best way to find a health problem early and help you avoid having a fall. Early diagnosis and treatment give you the best chance for managing medical conditions that are more common for people who are older than age 65.  Falls are a major cause of broken bones and head injuries in people who are older than age 65. Take precautions to prevent a fall at home.  Work with your health care provider to learn what changes you can make to improve your health and wellness and to prevent falls. This information is not intended  to replace advice given to you by your health care provider. Make sure you discuss any questions you have with your health care provider. Document Revised: 03/15/2019 Document Reviewed: 10/05/2017 Elsevier Patient Education  2021 Elsevier Inc.  

## 2020-12-18 LAB — COMPREHENSIVE METABOLIC PANEL
ALT: 26 IU/L (ref 0–44)
AST: 26 IU/L (ref 0–40)
Albumin/Globulin Ratio: 2.1 (ref 1.2–2.2)
Albumin: 4.6 g/dL (ref 3.8–4.8)
Alkaline Phosphatase: 79 IU/L (ref 44–121)
BUN/Creatinine Ratio: 19 (ref 10–24)
BUN: 17 mg/dL (ref 8–27)
Bilirubin Total: 0.4 mg/dL (ref 0.0–1.2)
CO2: 23 mmol/L (ref 20–29)
Calcium: 9.8 mg/dL (ref 8.6–10.2)
Chloride: 104 mmol/L (ref 96–106)
Creatinine, Ser: 0.89 mg/dL (ref 0.76–1.27)
GFR calc Af Amer: 102 mL/min/{1.73_m2} (ref >59)
GFR calc non Af Amer: 88 mL/min/{1.73_m2} (ref >59)
Globulin, Total: 2.2 g/dL (ref 1.5–4.5)
Glucose: 113 mg/dL — ABNORMAL HIGH (ref 65–99)
Potassium: 4.1 mmol/L (ref 3.5–5.2)
Sodium: 141 mmol/L (ref 134–144)
Total Protein: 6.8 g/dL (ref 6.0–8.5)

## 2020-12-18 LAB — HEMOGLOBIN A1C
Est. average glucose Bld gHb Est-mCnc: 108 mg/dL
Hgb A1c MFr Bld: 5.4 % (ref 4.8–5.6)

## 2020-12-29 DIAGNOSIS — Z471 Aftercare following joint replacement surgery: Secondary | ICD-10-CM | POA: Diagnosis not present

## 2020-12-29 DIAGNOSIS — M25522 Pain in left elbow: Secondary | ICD-10-CM | POA: Diagnosis not present

## 2020-12-29 DIAGNOSIS — Z96641 Presence of right artificial hip joint: Secondary | ICD-10-CM | POA: Diagnosis not present

## 2021-02-10 ENCOUNTER — Other Ambulatory Visit: Payer: Self-pay | Admitting: Emergency Medicine

## 2021-02-12 ENCOUNTER — Encounter (HOSPITAL_BASED_OUTPATIENT_CLINIC_OR_DEPARTMENT_OTHER): Payer: Self-pay | Admitting: Family Medicine

## 2021-02-12 ENCOUNTER — Other Ambulatory Visit: Payer: Self-pay

## 2021-02-12 ENCOUNTER — Ambulatory Visit (INDEPENDENT_AMBULATORY_CARE_PROVIDER_SITE_OTHER): Payer: Medicare Other | Admitting: Family Medicine

## 2021-02-12 DIAGNOSIS — R7303 Prediabetes: Secondary | ICD-10-CM

## 2021-02-12 DIAGNOSIS — M1990 Unspecified osteoarthritis, unspecified site: Secondary | ICD-10-CM | POA: Insufficient documentation

## 2021-02-12 DIAGNOSIS — M159 Polyosteoarthritis, unspecified: Secondary | ICD-10-CM | POA: Diagnosis not present

## 2021-02-12 DIAGNOSIS — Z6836 Body mass index (BMI) 36.0-36.9, adult: Secondary | ICD-10-CM | POA: Insufficient documentation

## 2021-02-12 MED ORDER — SHINGRIX 50 MCG/0.5ML IM SUSR: 0.5000 mL | Freq: Once | INTRAMUSCULAR | 0 refills | Status: DC

## 2021-02-12 MED ORDER — ZOSTER VAC RECOMB ADJUVANTED 50 MCG/0.5ML IM SUSR: 0.5000 mL | Freq: Once | INTRAMUSCULAR | 0 refills | Status: AC

## 2021-02-12 NOTE — Assessment & Plan Note (Signed)
Status post bilateral hip and knee replacements.  Most recent right hip replacement in November 2021. Can continue with meloxicam once daily, refill provided today, recommend using on an as-needed basis

## 2021-02-12 NOTE — Assessment & Plan Note (Signed)
Discussed importance of lifestyle modifications including diet and aerobic exercise We will plan to recheck weight at next visit

## 2021-02-12 NOTE — Progress Notes (Signed)
New Patient Office Visit  Subjective:  Patient ID: Stanley Kelly, male    DOB: Sep 04, 1951  Age: 70 y.o. MRN: [id number]  CC:  Chief Complaint  Patient presents with  . Prediabetes  . Osteoarthritis    HPI Stanley Kelly is a 70 year old male presenting to establish care.  Patient with past medical history significant for prediabetes, osteoarthritis s/p bilateral hip and knee arthroplasty.  Does have some questions today regarding prescription medication as well as obtaining Shingrix vaccine.  Prediabetes: A1c checked most recently with prior PCP last month.  Has been managing with lifestyle modifications.  Denies any issues of polyuria, polydipsia, increased fatigue, significant weight changes.  Osteoarthritis: Has had total knee and hip replacements completed bilaterally.  Currently takes meloxicam daily to help out with joint pains.  In regards to his prosthetic joints, he has been prescribed amoxicillin for dental procedure prophylaxis.  Has questions regarding the need for this prophylaxis.  Discussed Shingrix vaccine with his pharmacist and was advised that it would cost about $200.  He does desire the vaccination but feels that it should be covered as a preventative measure.  Past Medical History:  Diagnosis Date  . Allergy   . Arthritis    "knees" (08/13/2016)  . GERD (gastroesophageal reflux disease)   . Kidney stones    "no OR" (08/13/2016)    Past Surgical History:  Procedure Laterality Date  . INJECTION KNEE Bilateral 08/18/2015   Procedure: BILATERAL TOTAL KNEE BILATERAL CORTISONE INJECTION;  Surgeon: Dorna Leitz, MD;  Location: [facility];  Service: Orthopedics;  Laterality: Bilateral;  . JOINT REPLACEMENT    . KNEE ARTHROSCOPY Bilateral    "2 on my right; 1 on the left"  . KNEE SURGERY  ~ 1982   "opened it up, cleaned it out after it had locked up"  . TONSILLECTOMY     "I think"  . TOTAL HIP ARTHROPLASTY Left 08/18/2015   Procedure: TOTAL HIP ARTHROPLASTY ANTERIOR APPROACH;   Surgeon: Dorna Leitz, MD;  Location: [facility];  Service: Orthopedics;  Laterality: Left;  . TOTAL KNEE ARTHROPLASTY Right 07/05/2016   Procedure: RIGHT TOTAL KNEE ARTHROPLASTY;  Surgeon: Dorna Leitz, MD;  Location: [facility];  Service: Orthopedics;  Laterality: Right;  . TOTAL KNEE ARTHROPLASTY Left 08/13/2016  . TOTAL KNEE ARTHROPLASTY Left 08/13/2016   Procedure: TOTAL KNEE ARTHROPLASTY;  Surgeon: Dorna Leitz, MD;  Location: [facility];  Service: Orthopedics;  Laterality: Left;  Marland Kitchen VASECTOMY  1985    Family History  Adopted: Yes    Social History   Socioeconomic History  . Marital status: Married    Spouse name: Not on file  . Number of children: Not on file  . Years of education: Not on file  . Highest education level: Not on file  Occupational History  . Not on file  Tobacco Use  . Smoking status: Never Smoker  . Smokeless tobacco: Never Used  Vaping Use  . Vaping Use: Never used  Substance and Sexual Activity  . Alcohol use: No    Alcohol/week: 0.0 standard drinks  . Drug use: No  . Sexual activity: Not Currently  Other Topics Concern  . 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   Children - step children x3   Seat belt - 100%   Gun in home - secured   Social Determinants of Health   Financial Resource Strain: Not on file  Food Insecurity: Not on  file  Transportation Needs: Not on file  Physical Activity: Not on file  Stress: Not on file  Social Connections: Not on file  Intimate Partner Violence: Not on file    Objective:   Today's Vitals: BP 128/80   Pulse 69   Ht 5\' 7"  (1.702 m)   Wt 231 lb (104.8 kg)   SpO2 99%   BMI 36.18 kg/m   Physical Exam  Pleasant 70 year old male well-nourished, in no acute distress Cardiovascular exam with regular rate and rhythm, no murmurs appreciated Lungs clear to auscultation bilaterally  Assessment & Plan:   Problem List Items Addressed This Visit      Musculoskeletal and  Integument   Osteoarthritis    Status post bilateral hip and knee replacements.  Most recent right hip replacement in November 2021. Can continue with meloxicam once daily, refill provided today, recommend using on an as-needed basis        Other   BMI 36.0-36.9,adult    Discussed importance of lifestyle modifications including diet and aerobic exercise We will plan to recheck weight at next visit      Prediabetes    A1c checked most recently 1 month ago with prior PCP, results at that time was 5.4% which is normal Recommend continue with lifestyle modifications Plan for recheck A1c in about 6 months       Has not had Shingrix vaccine in the past and recently checked nose but was told it would cost about $200.  Provided prescription for patient to obtain vaccine at pharmacy.  He will check into price which I expect cost should be covered as a preventative measure under health insurance  Has previously been prescribed amoxicillin for prophylaxis with dental procedures given history of prosthetic joints.  Reviewed guidelines from ADA which recommends against routine prophylaxis for patients with prosthetic joints.  The American Academy of orthopedic surgeons suggest considering discontinuing routine procedural antibiotic prophylaxis.  Discussed both of these with the patient and decision was made to discontinue antibiotic prophylaxis prior to, dental procedures given the research showing that prophylaxis does not affect the incidence of prosthetic knee or hip infections.  Outpatient Encounter Medications as of 02/12/2021  Medication Sig  . Zoster Vaccine Adjuvanted [facility]) injection Inject 0.5 mLs into the muscle once for 1 dose.  . [DISCONTINUED] Zoster Vaccine Adjuvanted [facility]) injection Inject 0.5 mLs into the muscle once for 1 dose. Administer at 0 and 2-6 months for 2 total doses.  To be administered by pharmacy.  Marland Kitchen amoxicillin (AMOXIL) 500 MG capsule as needed.  Marland Kitchen aspirin 81 MG  tablet Take 81 mg by mouth daily.  Marland Kitchen esomeprazole (NEXIUM) 20 MG capsule Take 20 mg by mouth daily at 12 noon.  . meloxicam (MOBIC) 15 MG tablet TAKE 1 TABLET BY MOUTH EVERY DAY  . Multiple Vitamin (MULTIVITAMIN) tablet Take 1 tablet by mouth daily.  . Multiple Vitamins-Minerals (MULTIVITAMIN WITH MINERALS) tablet Take 1 tablet by mouth daily.  (Patient not taking: Reported on 12/17/2020)  . Omega-3 Fatty Acids (FISH OIL ADULT GUMMIES PO) Take by mouth daily.  Marland Kitchen OVER THE COUNTER MEDICATION Stool softener, 100 mg. I daily.  Marland Kitchen OVER THE COUNTER MEDICATION Vitamin -B 12 500 mg. One daily  . OVER THE COUNTER MEDICATION Potassium 595 mg. One tablet daily.  Marland Kitchen OVER THE COUNTER MEDICATION Vitamin E 400 units one capsule daily.  Marland Kitchen OVER THE COUNTER MEDICATION Vitamin C 100 mg. One tablet daily.  Marland Kitchen OVER THE COUNTER MEDICATION Vitamin D 3 1000  mg. One capsule daily.  Marland Kitchen OVER THE COUNTER MEDICATION 500 mg Niacin. One tablet daily.  Marland Kitchen OVER THE COUNTER MEDICATION Calcium 1200 mg. One capsule daily.  . Red Yeast Rice Extract (RED YEAST RICE PO) Take by mouth daily.   No facility-administered encounter medications on file as of 02/12/2021.   Spent 50 minutes on this patient encounter, including preparation, chart review, face-to-face counseling with patient and coordination of care, and documentation of encounter  Follow-up: Return in about 6 months (around 08/15/2021) for follow up - in office.   Ida Uppal J De [place], MD

## 2021-02-12 NOTE — Assessment & Plan Note (Signed)
A1c checked most recently 1 month ago with prior PCP, results at that time was 5.4% which is normal Recommend continue with lifestyle modifications Plan for recheck A1c in about 6 months

## 2021-02-12 NOTE — Patient Instructions (Addendum)
Medication Instructions:  Your physician recommends that you continue on your current medications as directed. Please refer to the Current Medication list given to you today. *If you need a refill on any your medications before your next appointment, please call your pharmacy first. If no refills are authorized on file call the office.*  Follow-Up: Your physician recommends that you schedule a follow-up appointment in: 6 MONTHS with Dr. De [place]  We recommend signing up for the patient portal called "MyChart".  Sign up information is provided on this After Visit Summary.  MyChart is used to connect with patients for Virtual Visits (Telemedicine).  Patients are able to view lab/test results, encounter notes, upcoming appointments, etc.  Non-urgent messages can be sent to your provider as well.   To learn more about what you can do with MyChart, go to [email or web address].

## 2021-04-24 DIAGNOSIS — M2041 Other hammer toe(s) (acquired), right foot: Secondary | ICD-10-CM | POA: Diagnosis not present

## 2021-06-16 ENCOUNTER — Ambulatory Visit: Payer: Self-pay | Admitting: Emergency Medicine

## 2021-07-15 DIAGNOSIS — I8311 Varicose veins of right lower extremity with inflammation: Secondary | ICD-10-CM | POA: Diagnosis not present

## 2021-07-15 DIAGNOSIS — I8312 Varicose veins of left lower extremity with inflammation: Secondary | ICD-10-CM | POA: Diagnosis not present

## 2021-08-12 DIAGNOSIS — I8311 Varicose veins of right lower extremity with inflammation: Secondary | ICD-10-CM | POA: Diagnosis not present

## 2021-08-12 DIAGNOSIS — I8312 Varicose veins of left lower extremity with inflammation: Secondary | ICD-10-CM | POA: Diagnosis not present

## 2021-08-14 ENCOUNTER — Other Ambulatory Visit: Payer: Self-pay

## 2021-08-14 ENCOUNTER — Encounter (HOSPITAL_BASED_OUTPATIENT_CLINIC_OR_DEPARTMENT_OTHER): Payer: Self-pay | Admitting: Family Medicine

## 2021-08-14 ENCOUNTER — Ambulatory Visit (INDEPENDENT_AMBULATORY_CARE_PROVIDER_SITE_OTHER): Payer: Medicare Other | Admitting: Family Medicine

## 2021-08-14 VITALS — BP 120/84 | HR 65 | Ht 67.0 in | Wt 219.4 lb

## 2021-08-14 DIAGNOSIS — N529 Male erectile dysfunction, unspecified: Secondary | ICD-10-CM | POA: Insufficient documentation

## 2021-08-14 DIAGNOSIS — E669 Obesity, unspecified: Secondary | ICD-10-CM | POA: Diagnosis not present

## 2021-08-14 DIAGNOSIS — R7303 Prediabetes: Secondary | ICD-10-CM | POA: Diagnosis not present

## 2021-08-14 DIAGNOSIS — I8312 Varicose veins of left lower extremity with inflammation: Secondary | ICD-10-CM | POA: Diagnosis not present

## 2021-08-14 DIAGNOSIS — I8311 Varicose veins of right lower extremity with inflammation: Secondary | ICD-10-CM | POA: Diagnosis not present

## 2021-08-14 DIAGNOSIS — Z600 Problems of adjustment to life-cycle transitions: Secondary | ICD-10-CM | POA: Diagnosis not present

## 2021-08-14 NOTE — Assessment & Plan Note (Signed)
Has achieved some intentional weight loss since last visit, has lost about 12 pounds Feels that he has not been as active as he would like to be due to lack of interest, life stressors. Congratulated on recent intentional weight loss, encouraged to continue striving for healthy, gradual weight loss with lifestyle modifications.  Discussed importance of setting short and long-term goals with specific, measurable and achievable results. Continue to follow-up on weight at future visit

## 2021-08-14 NOTE — Assessment & Plan Note (Signed)
Has made lifestyle modifications, has had recent intentional weight loss which is encouraging Attempted to recheck hemoglobin A1c today, however issue with machine in office.  We will plan to complete labs around time of next appointment.

## 2021-08-14 NOTE — Assessment & Plan Note (Signed)
Reports ongoing stressors related to his wife's health, his own health conditions as well as friends with recent medical diagnoses.  Feels that these issues have placed increased stress and worry on to him.  Denies any suicidal or homicidal ideation.  Feels that he is still sleeping well.  Does endorse lack of motivation to engage in some activities such as exercise. Discussed various options available, patient declines at this time.  Plans to continue to monitor symptoms, will try to increase activity.  We will continue to follow-up regarding these symptoms at future visits

## 2021-08-14 NOTE — Patient Instructions (Signed)
  Medication Instructions:  Your physician recommends that you continue on your current medications as directed. Please refer to the Current Medication list given to you today. --If you need a refill on any your medications before your next appointment, please call your pharmacy first. If no refills are authorized on file call the office.--  Follow-Up: Your next appointment:   Your physician recommends that you schedule a follow-up appointment in: 1-2 MONTHS with Dr. de [place]  Thanks for letting us be apart of your health journey!!  Primary Care and Sports Medicine   Dr. Arlina Robes [place]   We encourage you to activate your patient portal called "MyChart".  Sign up information is provided on this After Visit Summary.  MyChart is used to connect with patients for Virtual Visits (Telemedicine).  Patients are able to view lab/test results, encounter notes, upcoming appointments, etc.  Non-urgent messages can be sent to your provider as well. To learn more about what you can do with MyChart, please visit --  [email or web address].

## 2021-08-14 NOTE — Progress Notes (Signed)
    Procedures performed today:    None.  Independent interpretation of notes and tests performed by another provider:   None.  Brief History, Exam, Impression, and Recommendations:    BP 120/84   Pulse 65   Ht '5\' 7"'$  (1.702 m)   Wt 219 lb 6.4 oz (99.5 kg)   SpO2 97%   BMI 34.36 kg/m   Erectile dysfunction Discusses some issues related to erectile dysfunction.  Reports that his wife has had health issues for the better part of 5 years and intimacy has been limited over that timeframe in general.  Reports that more recently she has been doing well and there has been interest in engaging in sexual activity, however he has noticed problems with achieving and maintaining erection during this time.  His primary concern is related to potential underlying causes today.  Generally, is not wanting to utilize medications. Patient does report notable amount of stressors in life surrounding his wife's health, his own health issues as well as other things in life including friends with recent medical diagnoses. Discussed with him potential underlying causes of erectile dysfunction including systemic/structural causes such as chronic medical conditions, as well as the impact of life stressors, anxiety. Discussed various pharmacotherapy as well as addressing underlying causes such as stressors/anxiety with use of counseling.  Patient was understanding, he is not wanting to proceed with medication at this time, declines any counseling at this moment. Will continue to monitor  Phase of life problem Reports ongoing stressors related to his wife's health, his own health conditions as well as friends with recent medical diagnoses.  Feels that these issues have placed increased stress and worry on to him.  Denies any suicidal or homicidal ideation.  Feels that he is still sleeping well.  Does endorse lack of motivation to engage in some activities such as exercise. Discussed various options available, patient  declines at this time.  Plans to continue to monitor symptoms, will try to increase activity.  We will continue to follow-up regarding these symptoms at future visits  Obesity, Class I, BMI 30-34.9 Has achieved some intentional weight loss since last visit, has lost about 12 pounds Feels that he has not been as active as he would like to be due to lack of interest, life stressors. Congratulated on recent intentional weight loss, encouraged to continue striving for healthy, gradual weight loss with lifestyle modifications.  Discussed importance of setting short and long-term goals with specific, measurable and achievable results. Continue to follow-up on weight at future visit  Prediabetes Has made lifestyle modifications, has had recent intentional weight loss which is encouraging Attempted to recheck hemoglobin A1c today, however issue with machine in office.  We will plan to complete labs around time of next appointment.  Plan for follow-up in about 2 months for CPE Labs to be completed at next visit include CBC, CMP, lipid panel, hemoglobin A1c   ___________________________________________ Davari Lopes de [place], MD, ABFM, [facility] and [place]

## 2021-08-14 NOTE — Assessment & Plan Note (Signed)
Discusses some issues related to erectile dysfunction.  Reports that his wife has had health issues for the better part of 5 years and intimacy has been limited over that timeframe in general.  Reports that more recently she has been doing well and there has been interest in engaging in sexual activity, however he has noticed problems with achieving and maintaining erection during this time.  His primary concern is related to potential underlying causes today.  Generally, is not wanting to utilize medications. Patient does report notable amount of stressors in life surrounding his wife's health, his own health issues as well as other things in life including friends with recent medical diagnoses. Discussed with him potential underlying causes of erectile dysfunction including systemic/structural causes such as chronic medical conditions, as well as the impact of life stressors, anxiety. Discussed various pharmacotherapy as well as addressing underlying causes such as stressors/anxiety with use of counseling.  Patient was understanding, he is not wanting to proceed with medication at this time, declines any counseling at this moment. Will continue to monitor

## 2021-08-17 DIAGNOSIS — Z23 Encounter for immunization: Secondary | ICD-10-CM | POA: Diagnosis not present

## 2021-08-21 DIAGNOSIS — I8311 Varicose veins of right lower extremity with inflammation: Secondary | ICD-10-CM | POA: Diagnosis not present

## 2021-08-25 DIAGNOSIS — I8311 Varicose veins of right lower extremity with inflammation: Secondary | ICD-10-CM | POA: Diagnosis not present

## 2021-08-28 DIAGNOSIS — I8312 Varicose veins of left lower extremity with inflammation: Secondary | ICD-10-CM | POA: Diagnosis not present

## 2021-08-31 DIAGNOSIS — I8312 Varicose veins of left lower extremity with inflammation: Secondary | ICD-10-CM | POA: Diagnosis not present

## 2021-09-15 DIAGNOSIS — I8311 Varicose veins of right lower extremity with inflammation: Secondary | ICD-10-CM | POA: Diagnosis not present

## 2021-09-22 DIAGNOSIS — I8312 Varicose veins of left lower extremity with inflammation: Secondary | ICD-10-CM | POA: Diagnosis not present

## 2021-10-08 DIAGNOSIS — I8311 Varicose veins of right lower extremity with inflammation: Secondary | ICD-10-CM | POA: Diagnosis not present

## 2021-10-22 DIAGNOSIS — I872 Venous insufficiency (chronic) (peripheral): Secondary | ICD-10-CM | POA: Diagnosis not present

## 2021-10-22 DIAGNOSIS — I83892 Varicose veins of left lower extremities with other complications: Secondary | ICD-10-CM | POA: Diagnosis not present

## 2021-10-22 DIAGNOSIS — I83812 Varicose veins of left lower extremities with pain: Secondary | ICD-10-CM | POA: Diagnosis not present

## 2021-10-22 DIAGNOSIS — Z09 Encounter for follow-up examination after completed treatment for conditions other than malignant neoplasm: Secondary | ICD-10-CM | POA: Diagnosis not present

## 2021-11-02 DIAGNOSIS — Z23 Encounter for immunization: Secondary | ICD-10-CM | POA: Diagnosis not present

## 2021-11-18 DIAGNOSIS — I87392 Chronic venous hypertension (idiopathic) with other complications of left lower extremity: Secondary | ICD-10-CM | POA: Diagnosis not present

## 2021-11-18 DIAGNOSIS — M79605 Pain in left leg: Secondary | ICD-10-CM | POA: Diagnosis not present

## 2021-11-18 DIAGNOSIS — I83892 Varicose veins of left lower extremities with other complications: Secondary | ICD-10-CM | POA: Diagnosis not present

## 2021-11-25 DIAGNOSIS — I83892 Varicose veins of left lower extremities with other complications: Secondary | ICD-10-CM | POA: Diagnosis not present

## 2021-11-25 DIAGNOSIS — R600 Localized edema: Secondary | ICD-10-CM | POA: Diagnosis not present

## 2021-12-02 ENCOUNTER — Other Ambulatory Visit: Payer: Self-pay

## 2021-12-02 ENCOUNTER — Ambulatory Visit (INDEPENDENT_AMBULATORY_CARE_PROVIDER_SITE_OTHER): Payer: Medicare Other | Admitting: Family Medicine

## 2021-12-02 ENCOUNTER — Encounter (HOSPITAL_BASED_OUTPATIENT_CLINIC_OR_DEPARTMENT_OTHER): Payer: Self-pay | Admitting: Family Medicine

## 2021-12-02 VITALS — BP 126/80 | HR 63 | Ht 67.0 in | Wt 220.0 lb

## 2021-12-02 DIAGNOSIS — R7303 Prediabetes: Secondary | ICD-10-CM | POA: Diagnosis not present

## 2021-12-02 DIAGNOSIS — E669 Obesity, unspecified: Secondary | ICD-10-CM | POA: Diagnosis not present

## 2021-12-02 DIAGNOSIS — Z0000 Encounter for general adult medical examination without abnormal findings: Secondary | ICD-10-CM | POA: Diagnosis not present

## 2021-12-02 NOTE — Assessment & Plan Note (Addendum)
The patient was counseled, risk factors were discussed, anticipatory guidance given. Will complete labs today including CBC, CMP, lipid panel, A1c Discussed general dietary and physical activity recommendations Up-to-date on tetanus and colon cancer screening Flu shot completed at [facility] in [place] - Nov 2022 Shingles vaccine previously completed at [facility] - had two doses - at beginning of this year

## 2021-12-02 NOTE — Progress Notes (Signed)
Subjective:    CC: Annual Physical Exam  HPI:  Stanley Kelly is a 70 y.o. presenting for annual physical  I reviewed the past medical history, family history, social history, surgical history, and allergies today and no changes were needed.  Please see the problem list section below in epic for further details.  Past Medical History: Past Medical History:  Diagnosis Date   Allergy    Arthritis    "knees" (08/13/2016)   GERD (gastroesophageal reflux disease)    Kidney stones    "no OR" (08/13/2016)   Past Surgical History: Past Surgical History:  Procedure Laterality Date   INJECTION KNEE Bilateral 08/18/2015   Procedure: BILATERAL TOTAL KNEE BILATERAL CORTISONE INJECTION;  Surgeon: Dorna Leitz, MD;  Location: [place];  Service: Orthopedics;  Laterality: Bilateral;   JOINT REPLACEMENT     KNEE ARTHROSCOPY Bilateral    "2 on my right; 1 on the left"   KNEE SURGERY  ~ 1982   "opened it up, cleaned it out after it had locked up"   TONSILLECTOMY     "I think"   TOTAL HIP ARTHROPLASTY Left 08/18/2015   Procedure: TOTAL HIP ARTHROPLASTY ANTERIOR APPROACH;  Surgeon: Dorna Leitz, MD;  Location: [place];  Service: Orthopedics;  Laterality: Left;   TOTAL HIP ARTHROPLASTY Right 09/2020   TOTAL KNEE ARTHROPLASTY Right 07/05/2016   Procedure: RIGHT TOTAL KNEE ARTHROPLASTY;  Surgeon: Dorna Leitz, MD;  Location: [place];  Service: Orthopedics;  Laterality: Right;   TOTAL KNEE ARTHROPLASTY Left 08/13/2016   TOTAL KNEE ARTHROPLASTY Left 08/13/2016   Procedure: TOTAL KNEE ARTHROPLASTY;  Surgeon: Dorna Leitz, MD;  Location: [place];  Service: Orthopedics;  Laterality: Left;   VASECTOMY  1985   Social History: Social History   Socioeconomic History   Marital status: Married    Spouse name: Not on file   Number of children: Not on file   Years of education: Not on file   Highest education level: Not on file  Occupational History   Not on file  Tobacco Use   Smoking status: Never   Smokeless tobacco:  Never  Vaping Use   Vaping Use: Never used  Substance and Sexual Activity   Alcohol use: No    Alcohol/week: 0.0 standard drinks   Drug use: No   Sexual activity: Not Currently  Other Topics Concern   Not on file  Social History Narrative   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  Review of Systems: No headache, visual changes, nausea, vomiting, diarrhea, constipation, dizziness, abdominal pain, skin rash, fevers, chills, night sweats, swollen lymph nodes, weight loss, chest pain, body aches, joint swelling, muscle aches, shortness of breath, mood changes, visual or auditory hallucinations.  Objective:    BP 126/80    Pulse 63    Ht 5\' 7"  (1.702 m)    Wt 220 lb (99.8 kg)    SpO2 97%    BMI 34.46  kg/m   General: Well Developed, well nourished, and in no acute distress.  Neuro: Alert and oriented x3, extra-ocular muscles intact, sensation grossly intact. Cranial nerves II through XII are intact, motor, sensory, and coordinative functions are all intact. HEENT: Normocephalic, atraumatic, pupils equal round reactive to light, neck supple, no masses, no lymphadenopathy, thyroid nonpalpable. Oropharynx, nasopharynx, external ear canals are unremarkable. Skin: Warm and dry, no rashes noted.  Cardiac: Regular rate and rhythm, no murmurs rubs or gallops.  Respiratory: Clear to auscultation bilaterally. Not using accessory muscles, speaking in full sentences.  Abdominal: Soft, nontender, nondistended, positive bowel sounds, no masses,  no organomegaly.  Musculoskeletal: Shoulder, elbow, wrist, hip, knee, ankle stable, and with full range of motion.  Impression and Recommendations:    Wellness examination The patient was counseled, risk factors were discussed, anticipatory guidance given. Will complete labs today including CBC, CMP, lipid panel, A1c Discussed general dietary and physical activity recommendations Up-to-date on tetanus and colon cancer screening Flu shot completed at [facility] in [place] - Nov 2022 Shingles vaccine previously completed at [facility] - had two doses - at beginning of this year   ___________________________________________ Mekiyah Gladwell de [place], MD, ABFM, [facility] and [place]

## 2021-12-02 NOTE — Patient Instructions (Signed)
°  Medication Instructions:  Your physician recommends that you continue on your current medications as directed. Please refer to the Current Medication list given to you today. --If you need a refill on any your medications before your next appointment, please call your pharmacy first. If no refills are authorized on file call the office.-- Lab Work: Your physician has recommended that you have lab work today: CBC, CMP, Lipid, and A1C If you have labs (blood work) drawn today and your tests are completely normal, you will receive your results via [place] a phone call from our staff.  Please ensure you check your voicemail in the event that you authorized detailed messages to be left on a delegated number. If you have any lab test that is abnormal or we need to change your treatment, we will call you to review the results.  Follow-Up: Your next appointment:   Your physician recommends that you schedule a follow-up appointment in: 6 MONTHS with Dr. de [place]  You will receive a text message or e-mail with a link to a survey about your care and experience with [place] today! We would greatly appreciate your feedback!   Thanks for letting us be apart of your health journey!!  Primary Care and Sports Medicine   Dr. Arlina Robes [place]   We encourage you to activate your patient portal called "MyChart".  Sign up information is provided on this After Visit Summary.  MyChart is used to connect with patients for Virtual Visits (Telemedicine).  Patients are able to view lab/test results, encounter notes, upcoming appointments, etc.  Non-urgent messages can be sent to your provider as well. To learn more about what you can do with MyChart, please visit --  [email or web address].

## 2021-12-03 LAB — LIPID PANEL
Chol/HDL Ratio: 4 ratio (ref 0.0–5.0)
Cholesterol, Total: 162 mg/dL (ref 100–199)
HDL: 41 mg/dL (ref >39)
LDL Chol Calc (NIH): 93 mg/dL (ref 0–99)
Triglycerides: 162 mg/dL — ABNORMAL HIGH (ref 0–149)
VLDL Cholesterol Cal: 28 mg/dL (ref 5–40)

## 2021-12-03 LAB — CBC WITH DIFFERENTIAL/PLATELET
Basophils Absolute: 0 10*3/uL (ref 0.0–0.2)
Basos: 1 %
EOS (ABSOLUTE): 0.2 10*3/uL (ref 0.0–0.4)
Eos: 5 %
Hematocrit: 43.4 % (ref 37.5–51.0)
Hemoglobin: 15.2 g/dL (ref 13.0–17.7)
Immature Grans (Abs): 0 10*3/uL (ref 0.0–0.1)
Immature Granulocytes: 0 %
Lymphocytes Absolute: 1.4 10*3/uL (ref 0.7–3.1)
Lymphs: 40 %
MCH: 31.3 pg (ref 26.6–33.0)
MCHC: 35 g/dL (ref 31.5–35.7)
MCV: 89 fL (ref 79–97)
Monocytes Absolute: 0.4 10*3/uL (ref 0.1–0.9)
Monocytes: 10 %
Neutrophils Absolute: 1.5 10*3/uL (ref 1.4–7.0)
Neutrophils: 44 %
Platelets: 272 10*3/uL (ref 150–450)
RBC: 4.86 x10E6/uL (ref 4.14–5.80)
RDW: 11.8 % (ref 11.6–15.4)
WBC: 3.4 10*3/uL (ref 3.4–10.8)

## 2021-12-03 LAB — COMPREHENSIVE METABOLIC PANEL
ALT: 23 IU/L (ref 0–44)
AST: 19 IU/L (ref 0–40)
Albumin/Globulin Ratio: 2.1 (ref 1.2–2.2)
Albumin: 4.6 g/dL (ref 3.8–4.8)
Alkaline Phosphatase: 70 IU/L (ref 44–121)
BUN/Creatinine Ratio: 16 (ref 10–24)
BUN: 16 mg/dL (ref 8–27)
Bilirubin Total: 0.4 mg/dL (ref 0.0–1.2)
CO2: 26 mmol/L (ref 20–29)
Calcium: 9.9 mg/dL (ref 8.6–10.2)
Chloride: 102 mmol/L (ref 96–106)
Creatinine, Ser: 0.97 mg/dL (ref 0.76–1.27)
Globulin, Total: 2.2 g/dL (ref 1.5–4.5)
Glucose: 99 mg/dL (ref 70–99)
Potassium: 4.6 mmol/L (ref 3.5–5.2)
Sodium: 141 mmol/L (ref 134–144)
Total Protein: 6.8 g/dL (ref 6.0–8.5)
eGFR: 85 mL/min/{1.73_m2} (ref >59)

## 2021-12-03 LAB — HEMOGLOBIN A1C
Est. average glucose Bld gHb Est-mCnc: 120 mg/dL
Hgb A1c MFr Bld: 5.8 % — ABNORMAL HIGH (ref 4.8–5.6)

## 2021-12-23 DIAGNOSIS — I87303 Chronic venous hypertension (idiopathic) without complications of bilateral lower extremity: Secondary | ICD-10-CM | POA: Diagnosis not present

## 2021-12-23 DIAGNOSIS — I872 Venous insufficiency (chronic) (peripheral): Secondary | ICD-10-CM | POA: Diagnosis not present

## 2022-01-04 DIAGNOSIS — M25551 Pain in right hip: Secondary | ICD-10-CM | POA: Diagnosis not present

## 2022-01-04 DIAGNOSIS — M25561 Pain in right knee: Secondary | ICD-10-CM | POA: Diagnosis not present

## 2022-01-04 DIAGNOSIS — M25562 Pain in left knee: Secondary | ICD-10-CM | POA: Diagnosis not present

## 2022-01-04 DIAGNOSIS — M25552 Pain in left hip: Secondary | ICD-10-CM | POA: Diagnosis not present

## 2022-03-01 DIAGNOSIS — M545 Low back pain, unspecified: Secondary | ICD-10-CM | POA: Diagnosis not present

## 2022-03-01 DIAGNOSIS — M5442 Lumbago with sciatica, left side: Secondary | ICD-10-CM | POA: Diagnosis not present

## 2022-03-03 ENCOUNTER — Other Ambulatory Visit: Payer: Self-pay

## 2022-03-03 ENCOUNTER — Encounter (HOSPITAL_COMMUNITY): Payer: Self-pay | Admitting: Physical Therapy

## 2022-03-03 ENCOUNTER — Ambulatory Visit (HOSPITAL_COMMUNITY): Payer: Medicare Other | Attending: Orthopedic Surgery | Admitting: Physical Therapy

## 2022-03-03 DIAGNOSIS — R2689 Other abnormalities of gait and mobility: Secondary | ICD-10-CM | POA: Insufficient documentation

## 2022-03-03 DIAGNOSIS — R262 Difficulty in walking, not elsewhere classified: Secondary | ICD-10-CM | POA: Diagnosis not present

## 2022-03-03 DIAGNOSIS — M6281 Muscle weakness (generalized): Secondary | ICD-10-CM | POA: Insufficient documentation

## 2022-03-03 NOTE — Therapy (Signed)
?OUTPATIENT PHYSICAL THERAPY THORACOLUMBAR EVALUATION ? ? ?Patient Name: Stanley Kelly ?MRN: [id number] ?DOB:25-Aug-1951, 71 y.o., male ?Today's Date: 03/03/2022 ? ? PT End of Session - 03/03/22 0837   ? ? Visit Number 1   ? PT Start Time 0815   ? PT Stop Time 0907   ? PT Time Calculation (min) 52 min   ? Activity Tolerance Patient tolerated treatment well   ? Behavior During Therapy [facility] for tasks assessed/performed   ? ?  ?  ? ?  ? ? ?Past Medical History:  ?Diagnosis Date  ? Allergy   ? Arthritis   ? "knees" (08/13/2016)  ? GERD (gastroesophageal reflux disease)   ? Kidney stones   ? "no OR" (08/13/2016)  ? ?Past Surgical History:  ?Procedure Laterality Date  ? INJECTION KNEE Bilateral 08/18/2015  ? Procedure: BILATERAL TOTAL KNEE BILATERAL CORTISONE INJECTION;  Surgeon: Dorna Leitz, MD;  Location: [place];  Service: Orthopedics;  Laterality: Bilateral;  ? JOINT REPLACEMENT    ? KNEE ARTHROSCOPY Bilateral   ? "2 on my right; 1 on the left"  ? KNEE SURGERY  ~ 1982  ? "opened it up, cleaned it out after it had locked up"  ? TONSILLECTOMY    ? "I think"  ? TOTAL HIP ARTHROPLASTY Left 08/18/2015  ? Procedure: TOTAL HIP ARTHROPLASTY ANTERIOR APPROACH;  Surgeon: Dorna Leitz, MD;  Location: [employer];  Service: Orthopedics;  Laterality: Left;  ? TOTAL HIP ARTHROPLASTY Right 09/2020  ? TOTAL KNEE ARTHROPLASTY Right 07/05/2016  ? Procedure: RIGHT TOTAL KNEE ARTHROPLASTY;  Surgeon: Dorna Leitz, MD;  Location: [place];  Service: Orthopedics;  Laterality: Right;  ? TOTAL KNEE ARTHROPLASTY Left 08/13/2016  ? TOTAL KNEE ARTHROPLASTY Left 08/13/2016  ? Procedure: TOTAL KNEE ARTHROPLASTY;  Surgeon: Dorna Leitz, MD;  Location: [place];  Service: Orthopedics;  Laterality: Left;  ? VASECTOMY  1985  ? ?Patient Active Problem List  ? Diagnosis Date Noted  ? Wellness examination 12/02/2021  ? Obesity, Class I, BMI 30-34.9 08/14/2021  ? Phase of life problem 08/14/2021  ? Erectile dysfunction 08/14/2021  ? Prediabetes 02/12/2021  ? Osteoarthritis 02/12/2021  ?  S/P knee replacement 07/05/2016  ? Primary osteoarthritis of left hip 08/18/2015  ? Primary osteoarthritis of right knee 08/18/2015  ? Primary osteoarthritis of left knee 08/18/2015  ? History of osteoarthritis 10/07/2014  ? ? ?PCP: de [place], Raymond J, MD ? ?REFERRING PROVIDER: Dorna Leitz, MD ? ?REFERRING DIAG: Low back pain ? ?THERAPY DIAG:  ?Difficulty in walking, not elsewhere classified ? ?Muscle weakness (generalized) ? ?Other abnormalities of gait and mobility ? ?ONSET DATE: 7-8 weeks ? ?SUBJECTIVE:                                                                                                                                                                                          ? ?  SUBJECTIVE STATEMENT: ?Patient reports that his low back started hurting ~7-8 weeks ago and there have been radiating symptoms down the outside of his Lt leg which go down to his ankle. He was informed by his doctor that he has an abnormal gait even though he is not able to feel it while he walks. He has been using a "wobble board" in order to try strengthening his low back muscles, but this has not been affective and his back was getting worse. This was until he had and injection in the low back this Monday(3/27) and there has since been a major improvement. He would like to have an exercise program ready of what he should do when the pain comes back as he was warned by his physician that the injection is not a permanent fix and it will likely come back. Of note, the patient states that his Rt hip was replaced ~1 year ago and the Lt hip ~4 years ago.  ?PERTINENT HISTORY:  ?Bilateral knee and hip replacements. Prior DVT. ? ?PAIN:  ?Are you having pain? Yes: NPRS scale: 7/10 ?Pain location: Central lumbar spine and lateral LLE ?Pain description: shooting ?Aggravating factors: Standing up from a sitting position. ?Relieving factors: Rest of walking ? ? ?PRECAUTIONS: None ? ?WEIGHT BEARING RESTRICTIONS No ? ?FALLS:  ?Has patient  fallen in last 6 months? No ? ?LIVING ENVIRONMENT: ?Lives with: lives with their spouse ?Lives in: House/apartment ?Stairs: Yes: External: 3 steps; can reach both ?Has following equipment at home: Single point cane ? ?OCCUPATION: Retired ? ?PLOF: Independent ? ?PATIENT GOALS Patient would like to normalize his gait. ? ? ?OBJECTIVE:  ? ?DIAGNOSTIC FINDINGS:  ?No recent diagnostic findings are present in the system at the time of this evaluation, but the patient did indicate that XRs of his hips and low back were taken recently. ? ?PATIENT SURVEYS:  ?FOTO 72% function (78% predicted by #10) ? ?SCREENING FOR RED FLAGS: ?Bowel or bladder incontinence: No ?Spinal tumors: No ?Cauda equina syndrome: No ?Compression fracture: No ?Abdominal aneurysm: No ? ?COGNITION: ? Overall cognitive status: Within functional limits for tasks assessed   ?  ?SENSATION: ?Denies sensation changes ? ?POSTURE:  ?Loss of lumbar lordosis in sitting. ? ?PALPATION: ?TTP on the Lt low back  ? ?LUMBAR ROM:  ? ?Active  A/PROM  ?03/03/2022  ?Flexion 100%  ?Extension 75%(slight pain)  ?Right lateral flexion 50%(pressure on Rt low back)  ?Left lateral flexion 75%  ?Right rotation 100%  ?Left rotation 75%(pressure Rt low back)  ? (Blank rows = not tested) ? ?LE ROM: ? ?Active  Right ?03/03/2022 Left ?03/03/2022  ?Hip flexion    ?Hip extension    ?Hip abduction    ?Hip adduction    ?Hip internal rotation    ?Hip external rotation    ?Knee flexion    ?Knee extension    ?Ankle dorsiflexion    ?Ankle plantarflexion    ?Ankle inversion    ?Ankle eversion    ? (Blank rows = not tested) ? ?LE MMT: ? ?MMT Right ?03/03/2022 Left ?03/03/2022  ?Hip flexion 4+ 3+  ?Hip extension    ?Hip abduction 4+ 4+  ?Hip adduction 5 5  ?Hip internal rotation    ?Hip external rotation    ?Knee flexion 4+ 4+  ?Knee extension 5 5  ?Ankle dorsiflexion 5 5  ?Ankle plantarflexion    ?Ankle inversion    ?Ankle eversion    ? (Blank rows = not tested) ? ?LUMBAR SPECIAL TESTS:  ?Straight  leg  raise test: Negative, Slump test: Negative, FABER test: Positive, and Thomas test:   ? ?GAIT: ?Distance walked: 60 ?Assistive device utilized: None ?Level of assistance: Complete Independence ?Comments: Bilateral compensated trendelenburg ? ?PATIENT EDUCATION:  ?Education details: HEP, POC, gait mechanics, neurodynamics, and effects of compression on intervertebral disks. ?Person educated: Patient ?Education method: Explanation, Demonstration, and Handouts ?Education comprehension: verbalized understanding ? ? ?HOME EXERCISE PROGRAM: ?Access Code: [email or web address] ?URL: [email or web address] ?Date: 03/03/2022 ?Prepared by: Adalberto Cole ? ?Exercises ?- Supine Bilateral Hip Internal Rotation Stretch  - 2-3 x daily - 7 x weekly - 8 reps - 5s hold ?- Supine Figure 4 Piriformis Stretch  - 2-3 x daily - 7 x weekly - 5 reps - 20s hold ?- Self Traction Sitting  - 3-5 x daily - 7 x weekly - 3 reps - 20s hold ?- Sidelying Hip Abduction  - 1-2 x daily - 7 x weekly - 2 sets - 10 reps - 1s hold ?- Standing Hip Abduction with Counter Support  - 1-2 x daily - 7 x weekly - 2 sets - 12 reps - 1s hold ? ?ASSESSMENT: ? ?CLINICAL IMPRESSION: ?Patient is a 71 y.o. male presenting to physical therapy with c/o low back pain following no acute injury. He presents with pain limited deficits in Lt hip flexion and bilateral hip abduction strength, lumbar ROM,  spinal mobility and functional mobility with ADL. He is having to modify and restrict ADL as indicated by FOTO score as well as subjective information and objective measures which is affecting overall participation. Patient will benefit from skilled physical therapy in order to improve function and reduce impairment.  ? ? ?OBJECTIVE IMPAIRMENTS Abnormal gait, decreased activity tolerance, decreased ROM, decreased strength, and pain.  ? ?ACTIVITY LIMITATIONS cleaning, community activity, laundry, shopping, and yard work.  ? ?PERSONAL FACTORS Age and Fitness are also affecting patient's  functional outcome.  ? ? ?REHAB POTENTIAL: Good ? ?CLINICAL DECISION MAKING: Stable/uncomplicated ? ?EVALUATION COMPLEXITY: Low ? ? ?GOALS: ?Goals reviewed with patient? No ? ?LONG TERM GOALS: Target date:

## 2022-03-11 ENCOUNTER — Encounter (HOSPITAL_COMMUNITY): Payer: Self-pay | Admitting: Physical Therapy

## 2022-03-11 ENCOUNTER — Ambulatory Visit (HOSPITAL_COMMUNITY): Payer: Medicare Other | Attending: Orthopedic Surgery | Admitting: Physical Therapy

## 2022-03-11 DIAGNOSIS — M25551 Pain in right hip: Secondary | ICD-10-CM | POA: Insufficient documentation

## 2022-03-11 DIAGNOSIS — R262 Difficulty in walking, not elsewhere classified: Secondary | ICD-10-CM | POA: Insufficient documentation

## 2022-03-11 DIAGNOSIS — R2689 Other abnormalities of gait and mobility: Secondary | ICD-10-CM | POA: Insufficient documentation

## 2022-03-11 DIAGNOSIS — M6281 Muscle weakness (generalized): Secondary | ICD-10-CM | POA: Insufficient documentation

## 2022-03-11 DIAGNOSIS — M25651 Stiffness of right hip, not elsewhere classified: Secondary | ICD-10-CM | POA: Insufficient documentation

## 2022-03-11 NOTE — Therapy (Signed)
?OUTPATIENT PHYSICAL THERAPY TREATMENT NOTE ? ? ?Patient Name: Stanley Kelly ?MRN: [id number] ?DOB:Apr 24, 1951, 71 y.o., male ?Today's Date: 03/11/2022 ? ?PCP: de [place], Raymond J, MD ?REFERRING PROVIDER: de [place], Raymond J, MD ? ?END OF SESSION:  ? PT End of Session - 03/11/22 0743   ? ? Visit Number 2   ? Number of Visits 6   ? Date for PT Re-Evaluation 04/14/22   ? Authorization Type Primary: Medicare part A and*  //  Secondary: [facility]*(visit count based on medical need)   ? PT Start Time 0740   ? PT Stop Time 0815   ? PT Time Calculation (min) 35 min   ? Activity Tolerance Patient tolerated treatment well   ? Behavior During Therapy [facility] for tasks assessed/performed   ? ?  ?  ? ?  ? ? ?Past Medical History:  ?Diagnosis Date  ? Allergy   ? Arthritis   ? "knees" (08/13/2016)  ? GERD (gastroesophageal reflux disease)   ? Kidney stones   ? "no OR" (08/13/2016)  ? ?Past Surgical History:  ?Procedure Laterality Date  ? INJECTION KNEE Bilateral 08/18/2015  ? Procedure: BILATERAL TOTAL KNEE BILATERAL CORTISONE INJECTION;  Surgeon: Dorna Leitz, MD;  Location: [place];  Service: Orthopedics;  Laterality: Bilateral;  ? JOINT REPLACEMENT    ? KNEE ARTHROSCOPY Bilateral   ? "2 on my right; 1 on the left"  ? KNEE SURGERY  ~ 1982  ? "opened it up, cleaned it out after it had locked up"  ? TONSILLECTOMY    ? "I think"  ? TOTAL HIP ARTHROPLASTY Left 08/18/2015  ? Procedure: TOTAL HIP ARTHROPLASTY ANTERIOR APPROACH;  Surgeon: Dorna Leitz, MD;  Location: [place];  Service: Orthopedics;  Laterality: Left;  ? TOTAL HIP ARTHROPLASTY Right 09/2020  ? TOTAL KNEE ARTHROPLASTY Right 07/05/2016  ? Procedure: RIGHT TOTAL KNEE ARTHROPLASTY;  Surgeon: Dorna Leitz, MD;  Location: [place];  Service: Orthopedics;  Laterality: Right;  ? TOTAL KNEE ARTHROPLASTY Left 08/13/2016  ? TOTAL KNEE ARTHROPLASTY Left 08/13/2016  ? Procedure: TOTAL KNEE ARTHROPLASTY;  Surgeon: Dorna Leitz, MD;  Location: [place];  Service: Orthopedics;  Laterality: Left;  ? VASECTOMY   1985  ? ?Patient Active Problem List  ? Diagnosis Date Noted  ? Wellness examination 12/02/2021  ? Obesity, Class I, BMI 30-34.9 08/14/2021  ? Phase of life problem 08/14/2021  ? Erectile dysfunction 08/14/2021  ? Prediabetes 02/12/2021  ? Osteoarthritis 02/12/2021  ? S/P knee replacement 07/05/2016  ? Primary osteoarthritis of left hip 08/18/2015  ? Primary osteoarthritis of right knee 08/18/2015  ? Primary osteoarthritis of left knee 08/18/2015  ? History of osteoarthritis 10/07/2014  ? ? ?REFERRING DIAG: Low back pain ? ?THERAPY DIAG:  ?Difficulty in walking, not elsewhere classified ? ?Muscle weakness (generalized) ? ?Other abnormalities of gait and mobility ? ?Pain in right hip ? ?Stiffness of right hip, not elsewhere classified ? ?PERTINENT HISTORY: Bilateral knee and hip replacements. Prior DVT. ? ?PRECAUTIONS: None ? ?SUBJECTIVE: Patient reports that the exercises went very well at home and he was able to make itt past the initial soreness in the Lt hip that was caused by the exercises. The Lt hip now does not cause him pain and he believes he is ready for the next exercises. ? ?PAIN:  ?Are you having pain? No ? ?OBJECTIVE:  ?  ? ? TODAY'S TREATMENT: ?  03/11/22: ?  Aerobic: ?  -NuStep x87mn ?  Supine: ?  -PPT 2x8x5s holds ?  -  Dead bug 1x6 each ?  Seated: ?  -Banded(green) paloff press with march 1x16 each ?  Standing: ?  -Banded(green) paloff press with march 1x16 each ? ?  ?PATIENT EDUCATION:  ?Education details: HEP, POC, gait mechanics, neurodynamics, and effects of compression on intervertebral disks. ?Person educated: Patient ?Education method: Explanation, Demonstration, and Handouts ?Education comprehension: verbalized understanding ?  ?  ?HOME EXERCISE PROGRAM: ?Access Code: [email or web address] ?URL: [email or web address] ?Date: 03/03/2022 ?Prepared by: Adalberto Cole ?  ?Exercises ?- Supine Bilateral Hip Internal Rotation Stretch  - 2-3 x daily - 7 x weekly - 8 reps - 5s hold ?- Supine Figure 4 Piriformis  Stretch  - 2-3 x daily - 7 x weekly - 5 reps - 20s hold ?- Self Traction Sitting  - 3-5 x daily - 7 x weekly - 3 reps - 20s hold ?- Sidelying Hip Abduction  - 1-2 x daily - 7 x weekly - 2 sets - 10 reps - 1s hold ?- Standing Hip Abduction with Counter Support  - 1-2 x daily - 7 x weekly - 2 sets - 12 reps - 1s hold ?- Isometric Anti-Rotation Press  - 1-2 x daily - 7 x weekly - 2 sets - 16 reps ?- Supine Posterior Pelvic Tilt  - 3-4 x daily - 7 x weekly - 1 sets - 6 reps - 3s hold ?- Dead Bug  - 1-2 x daily - 7 x weekly - 2 sets - 10 reps ?  ?ASSESSMENT: ?  ?CLINICAL IMPRESSION: ?Patient performed well during today's session and the updated exercises were added to the HEP. These focused on stabilization of the core for which the patient did require a moderate amount fo verbal, visual, and tactile cueing. I believe that as long as there are no regressions by the next session he will be ready for discharge. ?  ?  ?OBJECTIVE IMPAIRMENTS Abnormal gait, decreased activity tolerance, decreased ROM, decreased strength, and pain.  ?  ?ACTIVITY LIMITATIONS cleaning, community activity, laundry, shopping, and yard work.  ?  ?PERSONAL FACTORS Age and Fitness are also affecting patient's functional outcome.  ?  ?  ?REHAB POTENTIAL: Good ?  ?CLINICAL DECISION MAKING: Stable/uncomplicated ?  ?EVALUATION COMPLEXITY: Low ?  ?  ?GOALS: ?Goals reviewed with patient? No ?  ?LONG TERM GOALS: Target date: 04/14/2022 ?  ?Patient will demonstrate a Lt hip flexion MMT of at least 4/5 ?Baseline: 3+ ?Goal status: INITIAL ?  ?2.  Patient will demonstrate a normalized gait with regard to the bilateral compensated trendelenburg gait. ?Baseline: Bilateral compensated trendelenburg gait ?Goal status: INITIAL ?  ?3.  Patient will be able to reach end ROM for all 6 lumbar spine motions without and onset of pain greater than 1/10. ?Baseline: 7/10 ?Goal status: INITIAL ?  ?4.  Patient will be independent with his HEP. ?Baseline: N/A ?Goal status:  INITIAL ?  ?  ?  ?PLAN: ?PT FREQUENCY: 1x/week ?  ?PT DURATION: 6 weeks ?  ?PLANNED INTERVENTIONS: Therapeutic exercises, Therapeutic activity, Neuromuscular re-education, Balance training, Gait training, Patient/Family education, Joint mobilization, Spinal manipulation, Spinal mobilization, and Manual therapy. ?  ?PLAN FOR NEXT SESSION: Discharge during next session assuming no regression occurs. ? ? ? ?Adalberto Cole, PT ?03/11/2022, 12:52 PM ? ?  ? ?

## 2022-03-18 ENCOUNTER — Encounter (HOSPITAL_COMMUNITY): Payer: Self-pay | Admitting: Physical Therapy

## 2022-03-18 ENCOUNTER — Ambulatory Visit (HOSPITAL_COMMUNITY): Payer: Medicare Other | Admitting: Physical Therapy

## 2022-03-18 DIAGNOSIS — M6281 Muscle weakness (generalized): Secondary | ICD-10-CM

## 2022-03-18 DIAGNOSIS — M25651 Stiffness of right hip, not elsewhere classified: Secondary | ICD-10-CM | POA: Diagnosis not present

## 2022-03-18 DIAGNOSIS — R2689 Other abnormalities of gait and mobility: Secondary | ICD-10-CM | POA: Diagnosis not present

## 2022-03-18 DIAGNOSIS — R262 Difficulty in walking, not elsewhere classified: Secondary | ICD-10-CM | POA: Diagnosis not present

## 2022-03-18 DIAGNOSIS — M25551 Pain in right hip: Secondary | ICD-10-CM | POA: Diagnosis not present

## 2022-03-18 NOTE — Therapy (Signed)
?OUTPATIENT PHYSICAL THERAPY TREATMENT NOTE ? ? ?Patient Name: Stanley Kelly ?MRN: [id number] ?DOB:09-Mar-1951, 71 y.o., male ?Today's Date: 03/18/2022 ? ?PCP: de [place], Blondell Reveal, MD ?REFERRING PROVIDER: Dorna Leitz, MD ? ?PHYSICAL THERAPY DISCHARGE SUMMARY ? ?Visits from Start of Care: 3 ? ?Current functional level related to goals / functional outcomes: ?See below ?  ?Remaining deficits: ?See below ?  ?Education / Equipment: ?See below  ? ?Patient agrees to discharge. Patient goals were met. Patient is being discharged due to meeting the stated rehab goals. ? ? ? ?END OF SESSION:  ? PT End of Session - 03/18/22 0748   ? ? Visit Number 3   ? Number of Visits 6   ? Date for PT Re-Evaluation 04/14/22   ? Authorization Type Primary: Medicare part A and*  //  Secondary: [facility]*(visit count based on medical need)   ? PT Start Time [phone]   ? PT Stop Time 0815   ? PT Time Calculation (min) 27 min   ? Activity Tolerance Patient tolerated treatment well   ? Behavior During Therapy [facility] for tasks assessed/performed   ? ?  ?  ? ?  ? ? ?Past Medical History:  ?Diagnosis Date  ? Allergy   ? Arthritis   ? "knees" (08/13/2016)  ? GERD (gastroesophageal reflux disease)   ? Kidney stones   ? "no OR" (08/13/2016)  ? ?Past Surgical History:  ?Procedure Laterality Date  ? INJECTION KNEE Bilateral 08/18/2015  ? Procedure: BILATERAL TOTAL KNEE BILATERAL CORTISONE INJECTION;  Surgeon: Dorna Leitz, MD;  Location: [place];  Service: Orthopedics;  Laterality: Bilateral;  ? JOINT REPLACEMENT    ? KNEE ARTHROSCOPY Bilateral   ? "2 on my right; 1 on the left"  ? KNEE SURGERY  ~ 1982  ? "opened it up, cleaned it out after it had locked up"  ? TONSILLECTOMY    ? "I think"  ? TOTAL HIP ARTHROPLASTY Left 08/18/2015  ? Procedure: TOTAL HIP ARTHROPLASTY ANTERIOR APPROACH;  Surgeon: Dorna Leitz, MD;  Location: [place];  Service: Orthopedics;  Laterality: Left;  ? TOTAL HIP ARTHROPLASTY Right 09/2020  ? TOTAL KNEE ARTHROPLASTY Right 07/05/2016  ?  Procedure: RIGHT TOTAL KNEE ARTHROPLASTY;  Surgeon: Dorna Leitz, MD;  Location: [place];  Service: Orthopedics;  Laterality: Right;  ? TOTAL KNEE ARTHROPLASTY Left 08/13/2016  ? TOTAL KNEE ARTHROPLASTY Left 08/13/2016  ? Procedure: TOTAL KNEE ARTHROPLASTY;  Surgeon: Dorna Leitz, MD;  Location: [place];  Service: Orthopedics;  Laterality: Left;  ? VASECTOMY  1985  ? ?Patient Active Problem List  ? Diagnosis Date Noted  ? Wellness examination 12/02/2021  ? Obesity, Class I, BMI 30-34.9 08/14/2021  ? Phase of life problem 08/14/2021  ? Erectile dysfunction 08/14/2021  ? Prediabetes 02/12/2021  ? Osteoarthritis 02/12/2021  ? S/P knee replacement 07/05/2016  ? Primary osteoarthritis of left hip 08/18/2015  ? Primary osteoarthritis of right knee 08/18/2015  ? Primary osteoarthritis of left knee 08/18/2015  ? History of osteoarthritis 10/07/2014  ? ? ?REFERRING DIAG: Low back pain ? ?THERAPY DIAG:  ?Difficulty in walking, not elsewhere classified ? ?Muscle weakness (generalized) ? ?Other abnormalities of gait and mobility ? ?PERTINENT HISTORY: Bilateral knee and hip replacements. Prior DVT. ? ?PRECAUTIONS: None ? ?SUBJECTIVE: Patient reports exercises have really helped. Has started exercising more. Symptoms have gone down. Feels ready to d/c from PT.  ? ?PAIN:  ?Are you having pain? No ? ?OBJECTIVE:  ?FOTO: 94 % function ? ? LUMBAR ROM:  ?  ?  Active  A/PROM  ?03/03/2022 AROM ?03/18/22  ?Flexion 100% 0% limited  ?Extension 75%(slight pain) 0% limited  ?Right lateral flexion 50%(pressure on Rt low back) 0% limited  ?Left lateral flexion 75% 0% limited  ?Right rotation 100% 0% limited  ?Left rotation 75%(pressure Rt low back) 0% limited  ? (Blank rows = not tested) ?  ? ?  ?LE MMT: ?  ?MMT Right ?03/03/2022 Left ?03/03/2022 Right  ?03/18/22 Left ?03/18/22  ?Hip flexion 4+ 3+ 4+/5 4-/5  ?Hip extension        ?Hip abduction 4+ 4+ 4+/5 4+/5  ?Hip adduction 5 5    ?Hip internal rotation        ?Hip external rotation        ?Knee flexion 4+  4+ 5/5 5/5  ?Knee extension 5 5 5/5 5/5  ?Ankle dorsiflexion 5 5    ?Ankle plantarflexion        ?Ankle inversion        ?Ankle eversion        ? (Blank rows = not tested) ? ? ? TODAY'S TREATMENT: ? 03/18/22 ?Update of objective measures ?Education ?Hip abduction2x 10 in standing with glute activation  ? ?03/11/22: ?  Aerobic: ?  -NuStep x71mn ?  Supine: ?  -PPT 2x8x5s holds ?  -Dead bug 1x6 each ?  Seated: ?  -Banded(green) paloff press with march 1x16 each ?  Standing: ?  -Banded(green) paloff press with march 1x16 each ? ?  ?PATIENT EDUCATION:  ?Education details: HEP, reassessment findings, returning to PT if needed, remaining active and how to progress exercise, proper glute activation with exercise, glute strength for gait ?Person educated: Patient ?Education method: Explanation, Demonstration, and Handouts ?Education comprehension: verbalized understanding ?  ?  ?HOME EXERCISE PROGRAM: ?Access Code: [email or web address] ?URL: [email or web address] ?Date: 03/03/2022 ?Prepared by: CAdalberto Cole?  ?Exercises ?- Supine Bilateral Hip Internal Rotation Stretch  - 2-3 x daily - 7 x weekly - 8 reps - 5s hold ?- Supine Figure 4 Piriformis Stretch  - 2-3 x daily - 7 x weekly - 5 reps - 20s hold ?- Self Traction Sitting  - 3-5 x daily - 7 x weekly - 3 reps - 20s hold ?- Sidelying Hip Abduction  - 1-2 x daily - 7 x weekly - 2 sets - 10 reps - 1s hold ?- Standing Hip Abduction with Counter Support  - 1-2 x daily - 7 x weekly - 2 sets - 12 reps - 1s hold ?- Isometric Anti-Rotation Press  - 1-2 x daily - 7 x weekly - 2 sets - 16 reps ?- Supine Posterior Pelvic Tilt  - 3-4 x daily - 7 x weekly - 1 sets - 6 reps - 3s hold ?- Dead Bug  - 1-2 x daily - 7 x weekly - 2 sets - 10 reps ?  ?ASSESSMENT: ?  ?CLINICAL IMPRESSION: ?Patient has met all short and long term goals with improved symptoms, gait, ROM, activity tolerance, strength and functional mobility. He remains limited by decreased hip strength but is working on [employer] workout  routine to continue to progress strength. Patient educated as listed in education section above. Patient discharged from physical therapy at this time.  ?  ?  ?OBJECTIVE IMPAIRMENTS Abnormal gait, decreased activity tolerance, decreased ROM, decreased strength, and pain.  ?  ?ACTIVITY LIMITATIONS cleaning, community activity, laundry, shopping, and yard work.  ?  ?PERSONAL FACTORS Age and Fitness are also affecting patient's functional outcome.  ?  ?  ?  REHAB POTENTIAL: Good ?  ?CLINICAL DECISION MAKING: Stable/uncomplicated ?  ?EVALUATION COMPLEXITY: Low ?  ?  ?GOALS: ?Goals reviewed with patient? No ?  ?LONG TERM GOALS: Target date: 04/14/2022 ?  ?Patient will demonstrate a Lt hip flexion MMT of at least 4/5 ?Baseline: see MMT ?Goal status: MET ?  ?2.  Patient will demonstrate a normalized gait with regard to the bilateral compensated trendelenburg gait. ?Baseline: Bilateral compensated trendelenburg gait ?Goal status: MET ?  ?3.  Patient will be able to reach end ROM for all 6 lumbar spine motions without and onset of pain greater than 1/10. ?Baseline: none ?Goal status: MET ?  ?4.  Patient will be independent with his HEP. ?Baseline: N/A ?Goal status: MET ?  ?  ?  ?PLAN: ?PT FREQUENCY: 1x/week ?  ?PT DURATION: 6 weeks ?  ?PLANNED INTERVENTIONS: Therapeutic exercises, Therapeutic activity, Neuromuscular re-education, Balance training, Gait training, Patient/Family education, Joint mobilization, Spinal manipulation, Spinal mobilization, and Manual therapy. ?  ?PLAN FOR NEXT SESSION: n/a ? ? ? ?8:19 AM, 03/18/22 ?Mearl Latin PT, DPT ?Physical Therapist at [facility] ?[facility] ? ? ?  ? ?

## 2022-03-22 DIAGNOSIS — M25551 Pain in right hip: Secondary | ICD-10-CM | POA: Diagnosis not present

## 2022-03-22 DIAGNOSIS — M25562 Pain in left knee: Secondary | ICD-10-CM | POA: Diagnosis not present

## 2022-03-22 DIAGNOSIS — M25561 Pain in right knee: Secondary | ICD-10-CM | POA: Diagnosis not present

## 2022-03-22 DIAGNOSIS — M25552 Pain in left hip: Secondary | ICD-10-CM | POA: Diagnosis not present

## 2022-03-25 ENCOUNTER — Encounter (HOSPITAL_COMMUNITY): Payer: Medicare Other | Admitting: Physical Therapy

## 2022-04-01 ENCOUNTER — Encounter (HOSPITAL_COMMUNITY): Payer: Medicare Other | Admitting: Physical Therapy

## 2022-04-08 ENCOUNTER — Encounter (HOSPITAL_COMMUNITY): Payer: Medicare Other

## 2022-04-15 ENCOUNTER — Encounter (HOSPITAL_COMMUNITY): Payer: Medicare Other

## 2022-04-22 ENCOUNTER — Encounter (HOSPITAL_COMMUNITY): Payer: Medicare Other | Admitting: Physical Therapy

## 2022-06-02 ENCOUNTER — Encounter (HOSPITAL_BASED_OUTPATIENT_CLINIC_OR_DEPARTMENT_OTHER): Payer: Self-pay | Admitting: Family Medicine

## 2022-06-02 ENCOUNTER — Ambulatory Visit (INDEPENDENT_AMBULATORY_CARE_PROVIDER_SITE_OTHER): Payer: Medicare Other | Admitting: Family Medicine

## 2022-06-02 VITALS — BP 108/82 | HR 64 | Ht 67.0 in | Wt 210.0 lb

## 2022-06-02 DIAGNOSIS — E785 Hyperlipidemia, unspecified: Secondary | ICD-10-CM

## 2022-06-02 DIAGNOSIS — R7303 Prediabetes: Secondary | ICD-10-CM | POA: Diagnosis not present

## 2022-06-02 DIAGNOSIS — E66811 Obesity, class 1: Secondary | ICD-10-CM

## 2022-06-02 DIAGNOSIS — E669 Obesity, unspecified: Secondary | ICD-10-CM | POA: Diagnosis not present

## 2022-06-02 LAB — HEMOGLOBIN A1C
Est. average glucose Bld gHb Est-mCnc: 120 mg/dL
Hgb A1c MFr Bld: 5.8 % — ABNORMAL HIGH (ref 4.8–5.6)

## 2022-06-02 NOTE — Assessment & Plan Note (Signed)
Recent hemoglobin A1c's have been stable, last check was about 6 months ago and found to be 5.8%.  He has been primarily focusing on lifestyle modifications, has been working on gradual weight loss as well, has lost about 10 pounds since last visit.  He primarily has been avoiding unhealthy food options and has been walking regularly We will recheck hemoglobin A1c today to monitor Recommend continuing with lifestyle modifications

## 2022-06-02 NOTE — Assessment & Plan Note (Signed)
Most recent lipid panel was adequate, current ASCVD risk score is about 12%, discussed this with patient as well as considerations including lifestyle modifications, pharmacotherapy, CAC scoring for further risk ratification At this time patient will continue with lifestyle modifications, we will plan to recheck lipid panel in about 6 months for monitoring Consider CAC scoring in the future

## 2022-06-02 NOTE — Progress Notes (Signed)
    Procedures performed today:    None.  Independent interpretation of notes and tests performed by another provider:   None.  Brief History, Exam, Impression, and Recommendations:    BP 108/82   Pulse 64   Ht '5\' 7"'$  (1.702 m)   Wt 210 lb (95.3 kg)   SpO2 99%   BMI 32.89 kg/m   Prediabetes Recent hemoglobin A1c's have been stable, last check was about 6 months ago and found to be 5.8%.  He has been primarily focusing on lifestyle modifications, has been working on gradual weight loss as well, has lost about 10 pounds since last visit.  He primarily has been avoiding unhealthy food options and has been walking regularly We will recheck hemoglobin A1c today to monitor Recommend continuing with lifestyle modifications  Obesity, Class I, BMI 30-34.9 Has been achieving gradual weight loss through lifestyle modifications, is tolerating 200 pounds as next step in this process.  Has lost about 10 pounds since last visit with [place]. Encouraged to continue with lifestyle modifications  Dyslipidemia Most recent lipid panel was adequate, current ASCVD risk score is about 12%, discussed this with patient as well as considerations including lifestyle modifications, pharmacotherapy, CAC scoring for further risk ratification At this time patient will continue with lifestyle modifications, we will plan to recheck lipid panel in about 6 months for monitoring Consider CAC scoring in the future  Spent 30 minutes on this patient encounter, including preparation, chart review, face-to-face counseling with patient and coordination of care, and documentation of encounter  Return in about 6 months (around 12/02/2022) for PreDM, Dyslipidemia.   ___________________________________________ Berish Bohman de [place], MD, ABFM, CAQSM [facility] [place]

## 2022-06-02 NOTE — Assessment & Plan Note (Signed)
Has been achieving gradual weight loss through lifestyle modifications, is tolerating 200 pounds as next step in this process.  Has lost about 10 pounds since last visit with [place]. Encouraged to continue with lifestyle modifications

## 2022-06-22 DIAGNOSIS — M7989 Other specified soft tissue disorders: Secondary | ICD-10-CM | POA: Diagnosis not present

## 2022-06-22 DIAGNOSIS — I87393 Chronic venous hypertension (idiopathic) with other complications of bilateral lower extremity: Secondary | ICD-10-CM | POA: Diagnosis not present

## 2022-07-02 DIAGNOSIS — I872 Venous insufficiency (chronic) (peripheral): Secondary | ICD-10-CM | POA: Diagnosis not present

## 2022-07-02 DIAGNOSIS — M79605 Pain in left leg: Secondary | ICD-10-CM | POA: Diagnosis not present

## 2022-07-02 DIAGNOSIS — L819 Disorder of pigmentation, unspecified: Secondary | ICD-10-CM | POA: Diagnosis not present

## 2022-07-02 DIAGNOSIS — M2520 Flail joint, unspecified joint: Secondary | ICD-10-CM | POA: Diagnosis not present

## 2022-11-22 ENCOUNTER — Ambulatory Visit (HOSPITAL_BASED_OUTPATIENT_CLINIC_OR_DEPARTMENT_OTHER): Payer: Medicare Other | Admitting: Family Medicine

## 2023-01-31 ENCOUNTER — Telehealth: Payer: Self-pay | Admitting: Family Medicine

## 2023-01-31 NOTE — Telephone Encounter (Signed)
Called patient to schedule Medicare Annual Wellness Visit (AWV). Left message for patient to call back and schedule Medicare Annual Wellness Visit (AWV).  Last date of AWV: 12/01/2020   Please schedule an appointment at any time with [place], [facility].  If any questions, please contact me at [phone].    Thank you,  [place] Direct dial  [phone]

## 2023-02-23 ENCOUNTER — Telehealth (HOSPITAL_BASED_OUTPATIENT_CLINIC_OR_DEPARTMENT_OTHER): Payer: Self-pay | Admitting: Family Medicine

## 2023-02-23 NOTE — Telephone Encounter (Signed)
Called patient to schedule Medicare Annual Wellness Visit (AWV). Left message for patient to call back and schedule Medicare Annual Wellness Visit (AWV).  Last date of AWV: 12/01/2020   Please schedule an AWVS appointment at any time with [facility] VISIT.  If any questions, please contact me at [phone].    Thank you,  [place] Direct dial  [phone]

## 2023-07-14 ENCOUNTER — Other Ambulatory Visit: Payer: Self-pay

## 2023-07-14 ENCOUNTER — Encounter: Payer: Self-pay | Admitting: Neurology

## 2023-07-14 DIAGNOSIS — R202 Paresthesia of skin: Secondary | ICD-10-CM

## 2023-07-21 ENCOUNTER — Ambulatory Visit (INDEPENDENT_AMBULATORY_CARE_PROVIDER_SITE_OTHER): Payer: No Typology Code available for payment source | Admitting: Neurology

## 2023-07-21 DIAGNOSIS — G5603 Carpal tunnel syndrome, bilateral upper limbs: Secondary | ICD-10-CM | POA: Diagnosis not present

## 2023-07-21 DIAGNOSIS — R202 Paresthesia of skin: Secondary | ICD-10-CM

## 2023-07-21 NOTE — Procedures (Signed)
[facility]  [address] [place], [address]  [place], [place] [address] Tel: [phone] Fax: [phone] Test Date:  07/21/2023  Patient: Stanley Kelly DOB: 12/15/1950 Physician: Nita Sickle, DO  Sex: Male Height: 5\' 7"  Ref Phys: Barry Dienes, DO  ID#: [id number]   Technician:    History: This is a 72 year old man referred for evaluation of bilateral hand paresthesias and pain.  NCV & EMG Findings: Extensive electrodiagnostic testing of the right upper extremity and additional study of the left shows:  Bilateral median sensory responses show prolonged latency (R6.2, L5.4 ms) and reduced amplitude (R4.5, L5.7 V).  Bilateral ulnar sensory responses are within normal limits. Bilateral median motor responses show prolonged latency (R6.8, L5.0 ms) and reduced amplitude (R1.9, L3.9 mV).  Left ulnar motor response shows slowed conduction velocity across the (A Elbow-B Elbow, 43 m/s).  Right ulnar motor responses within normal limits.   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    Impression: Bilateral median neuropathy at or distal to the wrist, consistent with a clinical diagnosis of carpal tunnel syndrome.  Overall, these findings are severe in degree electrically and worse on the right. Left ulnar neuropathy with slowing across the elbow, demyelinating, mild.   ___________________________ Nita Sickle, DO    Nerve Conduction Studies   Stim Site NR Peak (ms) Norm Peak (ms) O-P Amp (V) Norm O-P Amp  Left Median Anti Sensory (2nd Digit)  32 C  Wrist    *5.4 <3.8 *5.7 >10  Right Median Anti Sensory (2nd Digit)  32 C  Wrist    *6.2 <3.8 *4.5 >10  Left Ulnar Anti Sensory (5th Digit)  32 C  Wrist    3.2 <3.2 10.1 >5  Right Ulnar Anti Sensory (5th Digit)  32 C  Wrist    3.0 <3.2 11.0 >5     Stim Site NR Onset (ms) Norm Onset (ms) O-P Amp (mV) Norm O-P Amp Site1 Site2 Delta-0  (ms) Dist (cm) Vel (m/s) Norm Vel (m/s)  Left Median Motor (Abd Poll Brev)  32 C  Wrist    *5.0 <4.0 *3.9 >5 Elbow Wrist 5.5 29.0 53 >50  Elbow    10.5  3.6         Right Median Motor (Abd Poll Brev)  32 C  Wrist    *6.8 <4.0 *1.9 >5 Elbow Wrist 5.4 29.0 54 >50  Elbow    12.2  1.9         Left Ulnar Motor (Abd Dig Minimi)  32 C  Wrist    2.7 <3.1 10.1 >7 B Elbow Wrist 3.6 23.0 64 >50  B Elbow    6.3  8.6  A Elbow B Elbow 2.3 10.0 *43 >50  A Elbow    8.6  8.2         Right Ulnar Motor (Abd Dig Minimi)  32 C  Wrist    2.5 <3.1 10.0 >7 B Elbow Wrist 4.2 23.0 55 >50  B Elbow    6.7  9.0  A Elbow B Elbow 1.7 10.0 59 >50  A Elbow    8.4  9.0          Electromyography   Side Muscle Ins.Act Fibs Fasc Recrt Amp Dur Poly Activation Comment  Right 1stDorInt Nml Nml Nml Nml Nml Nml Nml Nml N/A  Right Abd Poll Brev Nml Nml Nml *3- *1+ *1+ *1+ Nml *ATR  Right PronatorTeres Nml Nml  Nml Nml Nml Nml Nml Nml N/A  Right Biceps Nml Nml Nml Nml Nml Nml Nml Nml N/A  Right Triceps Nml Nml Nml Nml Nml Nml Nml Nml N/A  Right Deltoid Nml Nml Nml Nml Nml Nml Nml Nml N/A  Left 1stDorInt Nml Nml Nml Nml Nml Nml Nml Nml N/A  Left Abd Poll Brev Nml Nml Nml *2- *1+ *1+ *1+ Nml N/A  Left PronatorTeres Nml Nml Nml Nml Nml Nml Nml Nml N/A  Left Biceps Nml Nml Nml Nml Nml Nml Nml Nml N/A  Left Triceps Nml Nml Nml Nml Nml Nml Nml Nml N/A  Left Deltoid Nml Nml Nml Nml Nml Nml Nml Nml N/A  Left Abd Dig Min Nml Nml Nml Nml Nml Nml Nml Nml N/A  Left FlexCarpiUln Nml Nml Nml Nml Nml Nml Nml Nml N/A      Waveforms:

## 2024-02-19 ENCOUNTER — Encounter (HOSPITAL_COMMUNITY): Payer: Self-pay | Admitting: Emergency Medicine

## 2024-02-19 ENCOUNTER — Other Ambulatory Visit: Payer: Self-pay

## 2024-02-19 ENCOUNTER — Emergency Department (HOSPITAL_COMMUNITY)
Admission: EM | Admit: 2024-02-19 | Discharge: 2024-02-19 | Disposition: A | Discharge status: Home / Self Care | Attending: Emergency Medicine | Admitting: Emergency Medicine

## 2024-02-19 DIAGNOSIS — L2489 Irritant contact dermatitis due to other agents: Secondary | ICD-10-CM | POA: Insufficient documentation

## 2024-02-19 DIAGNOSIS — R21 Rash and other nonspecific skin eruption: Secondary | ICD-10-CM | POA: Diagnosis present

## 2024-02-19 DIAGNOSIS — Z7982 Long term (current) use of aspirin: Secondary | ICD-10-CM | POA: Insufficient documentation

## 2024-02-19 MED ORDER — PREDNISONE 10 MG PO TABS: ORAL_TABLET | ORAL | 0 refills | Status: AC

## 2024-02-19 MED ORDER — PREDNISONE 20 MG PO TABS
40.0000 mg | ORAL_TABLET | Freq: Once | ORAL | Status: AC
  Administered 2024-02-19: 40 mg via ORAL
  Filled 2024-02-19: qty 2

## 2024-02-19 MED ORDER — DIPHENHYDRAMINE HCL 25 MG PO CAPS
25.0000 mg | ORAL_CAPSULE | Freq: Once | ORAL | Status: AC
  Administered 2024-02-19: 25 mg via ORAL
  Filled 2024-02-19: qty 1

## 2024-02-19 NOTE — ED Provider Notes (Signed)
 [place] EMERGENCY DEPARTMENT AT [facility] Provider Note   CSN: [id number] Arrival date & time: 02/19/24  4782     History  Chief Complaint  Patient presents with   Rash    Stanley Kelly is a 73 y.o. male who recently underwent carpal tunnel surgery on his left wrist 2 weeks ago at the [place], presents with concern for blistering and redness of the skin underneath his cast.  He reports he first noticed this a couple days ago.  The area is also very itchy.  States he is allergic to the adhesive on bandages and is concerned this could be an allergic reaction.  Denies any difficulty breathing, lip or tongue swelling, nausea or vomiting.  He denies any fever or chills.  Denies any increase in surgical site pain.   Rash      Home Medications Prior to Admission medications   Medication Sig Start Date End Date Taking? Authorizing Provider  predniSONE (DELTASONE) 10 MG tablet Take 4 tablets (40 mg total) by mouth daily with breakfast for 1 day, THEN 3 tablets (30 mg total) daily with breakfast for 2 days, THEN 2 tablets (20 mg total) daily with breakfast for 2 days, THEN 1 tablet (10 mg total) daily with breakfast for 1 day. 02/20/24 02/26/24 Yes Arabella Merles, PA-C  aspirin 81 MG tablet Take 81 mg by mouth daily.    [provider]  esomeprazole (NEXIUM) 20 MG capsule Take 20 mg by mouth daily at 12 noon.    [provider]  meloxicam (MOBIC) 15 MG tablet TAKE 1 TABLET BY MOUTH EVERY DAY 02/10/21   Georgina Quint, MD  Multiple Vitamin (MULTIVITAMIN) tablet Take 1 tablet by mouth daily.    [provider]  Omega-3 Fatty Acids (FISH OIL ADULT GUMMIES PO) Take by mouth daily.    [provider]  OVER THE COUNTER MEDICATION Stool softener, 100 mg. I daily.    [provider]  OVER THE COUNTER MEDICATION Vitamin -B 12 500 mg. One daily    [provider]  OVER THE COUNTER MEDICATION Potassium 595 mg. One tablet daily.     [provider]  OVER THE COUNTER MEDICATION Vitamin E 400 units one capsule daily.    [provider]  OVER THE COUNTER MEDICATION Vitamin C 100 mg. One tablet daily.    [provider]  OVER THE COUNTER MEDICATION Vitamin D 3 1000 mg. One capsule daily.    [provider]  OVER THE COUNTER MEDICATION 500 mg Niacin. One tablet daily.    [provider]  OVER THE COUNTER MEDICATION Calcium 1200 mg. One capsule daily.    [provider]  Red Yeast Rice Extract (RED YEAST RICE PO) Take by mouth daily.    [provider]      Allergies    No known allergies and Other    Review of Systems   Review of Systems  Skin:  Positive for rash.    Physical Exam Updated Vital Signs BP (!) 144/82 (BP Location: Right Arm)   Pulse 65   Temp 97.8 F (36.6 C) (Oral)   Resp 16   Wt 102.5 kg   SpO2 97%   BMI 35.40 kg/m  Physical Exam Vitals and nursing note reviewed.  Constitutional:      Appearance: Normal appearance.  HENT:     Head: Atraumatic.  Cardiovascular:     Rate and Rhythm: Normal rate and regular rhythm.  Comments: 2+ radial pulse bilaterally Intact cap refill of the left upper extremity Pulmonary:     Effort: Pulmonary effort is normal.  Skin:    Comments: Left wrist with clean dry and intact incision site from carpal tunnel surgery.  No surrounding erythema or edema.  Erythema of the left forearm extending up to the elbow.  Overlying blisters of the left anterior of the forearm. No pus drainage  Neurological:     General: No focal deficit present.     Mental Status: He is alert.     Comments: Intact sensation of the bilateral upper extremities  Psychiatric:        Mood and Affect: Mood normal.        Behavior: Behavior normal.          ED Results / Procedures / Treatments   Labs (all labs ordered are listed, but only abnormal results are displayed) Labs Reviewed - No data to  display  EKG None  Radiology No results found.  Procedures Procedures    Medications Ordered in ED Medications  diphenhydrAMINE (BENADRYL) capsule 25 mg (has no administration in time range)  predniSONE (DELTASONE) tablet 40 mg (has no administration in time range)    ED Course/ Medical Decision Making/ A&P                                 Medical Decision Making Risk Prescription drug management.     Differential diagnosis includes but is not limited to contact dermatitis, medication reaction, cellulitis  ED Course:  Patient well-appearing, stable vital signs.  Reports she ran rash under his splint over the course of the day has noticed over the past couple of days.  Splint was removed and patient does have erythematous rash of the skin of the forearm with overlying blistering.  No pus drainage from the blistering.  He does not have any significant edema, erythema surrounding the incision site overlying the wrist.  No red streaking up the arm.  This does not look infected, looks consistent with contact dermatitis especially given that the rash is itchy and he has known allergy to bandage adhesives.  I discussed this case with my attending Dr. Jearld Fenton who also feels this is a contact dermatitis.  She recommended Benadryl and prednisone here, and continuation of these medications at home.   I placed Xeroform gauze over the open blisters on his forearm.  Resplinted him in a volar short arm splint. Patient remains neurovascularly intact after splint placement  Patient states [facility] for recheck.  Stable appropriate for discharge home this time.  Impression: Contact dermatitis  Disposition:  The patient was discharged home with instructions to take course of prednisone as prescribed.  May take Benadryl as needed for itching.  Follow-up with his surgeon at the [facility] at his appointment on 02/21/2024 for recheck of his surgical site and  symptoms. Return precautions given.             Final Clinical Impression(s) / ED Diagnoses Final diagnoses:  Irritant contact dermatitis due to other agents    Rx / DC Orders ED Discharge Orders          Ordered    predniSONE (DELTASONE) 10 MG tablet  Q breakfast        02/19/24 2301              Arabella Merles, PA-C  02/19/24 2308    Loetta Rough, MD 02/27/24 [phone]

## 2024-02-19 NOTE — Progress Notes (Signed)
 Orthopedic Tech Progress Note Patient Details:  Stanley Kelly [birth year] [id number]  Ortho Devices Type of Ortho Device: Volar splint Ortho Device/Splint Location: lue Ortho Device/Splint Interventions: Ordered, Application, Adjustment   Post Interventions Patient Tolerated: Well Instructions Provided: Care of device, Adjustment of device  Trinna Post 02/19/2024, 11:42 PM

## 2024-02-19 NOTE — Discharge Instructions (Addendum)
 You have been prescribed prednisone which is an anti-inflammatory to help with your rash. Please take this medication as prescribed for the next 7 days (40mg  on days 1 and 2, 30mg  on days 3 and 4, 20mg  on days 5 and 6, 10mg  on day 7).  You were given the first dose of this medication here today.  Take this medication in the morning with breakfast, as taking it at night may make it hard to sleep. If you are a diabetic, please monitor your blood sugars closely on this medication, as it can cause your blood sugar to rise. If your blood sugars rise above 400, stop taking the medication and go to the ER.   You may take 25 mg of Benadryl every 4-6 hours as needed for itching.  You were given your first dose here today.  This medication can make you drowsy.  If you would like to try a nondrowsy medication, you may take 10 mg of cetirizine (Zyrtec) daily instead of the Benadryl.  These are both over-the-counter medications.  [facility] for [facility]  Return to the ER if you have any spreading of your rash, shortness of breath, difficulty breathing, fevers, any other new or concerning symptoms.

## 2024-02-19 NOTE — ED Triage Notes (Addendum)
 Patient had left wrist surgery 2 weeks ago and reports he's having an allergic reaction to the bandages.  Patient reports blisters under bandages and red rash going up arm.  Patient c/o itching and irritation under the bandages.

## 2024-04-24 DIAGNOSIS — E669 Obesity, unspecified: Secondary | ICD-10-CM | POA: Diagnosis not present

## 2024-04-24 DIAGNOSIS — E785 Hyperlipidemia, unspecified: Secondary | ICD-10-CM | POA: Diagnosis not present

## 2024-04-24 DIAGNOSIS — Z6834 Body mass index (BMI) 34.0-34.9, adult: Secondary | ICD-10-CM | POA: Diagnosis not present

## 2024-04-24 DIAGNOSIS — E1169 Type 2 diabetes mellitus with other specified complication: Secondary | ICD-10-CM | POA: Diagnosis not present

## 2024-04-24 DIAGNOSIS — Z008 Encounter for other general examination: Secondary | ICD-10-CM | POA: Diagnosis not present

## 2024-05-28 ENCOUNTER — Encounter (HOSPITAL_BASED_OUTPATIENT_CLINIC_OR_DEPARTMENT_OTHER): Payer: Self-pay | Admitting: *Deleted

## 2024-10-24 ENCOUNTER — Other Ambulatory Visit (HOSPITAL_BASED_OUTPATIENT_CLINIC_OR_DEPARTMENT_OTHER): Payer: Self-pay
